# Patient Record
Sex: Male | Born: 1955 | Race: Black or African American | Hispanic: No | Smoking: Former smoker
Health system: Southern US, Community
[De-identification: ages and names within clinical notes are randomized; demographics above are authoritative.]

## PROBLEM LIST (undated history)

## (undated) DIAGNOSIS — G43909 Migraine, unspecified, not intractable, without status migrainosus: Secondary | ICD-10-CM

## (undated) DIAGNOSIS — J45909 Unspecified asthma, uncomplicated: Secondary | ICD-10-CM

## (undated) DIAGNOSIS — C3490 Malignant neoplasm of unspecified part of unspecified bronchus or lung: Secondary | ICD-10-CM

## (undated) DIAGNOSIS — E079 Disorder of thyroid, unspecified: Secondary | ICD-10-CM

## (undated) DIAGNOSIS — M549 Dorsalgia, unspecified: Secondary | ICD-10-CM

## (undated) HISTORY — PX: THYROIDECTOMY: SHX17

## (undated) HISTORY — DX: Migraine, unspecified, not intractable, without status migrainosus: G43.909

## (undated) HISTORY — DX: Malignant neoplasm of unspecified part of unspecified bronchus or lung: C34.90

## (undated) HISTORY — DX: Unspecified asthma, uncomplicated: J45.909

---

## 2002-06-27 ENCOUNTER — Emergency Department (HOSPITAL_COMMUNITY): Admission: EM | Admit: 2002-06-27 | Discharge: 2002-06-27 | Payer: Self-pay | Admitting: *Deleted

## 2008-05-21 ENCOUNTER — Emergency Department (HOSPITAL_COMMUNITY): Admission: EM | Admit: 2008-05-21 | Discharge: 2008-05-22 | Payer: Self-pay | Admitting: Emergency Medicine

## 2012-10-23 ENCOUNTER — Other Ambulatory Visit (HOSPITAL_COMMUNITY): Payer: Self-pay | Admitting: Physician Assistant

## 2012-10-23 DIAGNOSIS — R2241 Localized swelling, mass and lump, right lower limb: Secondary | ICD-10-CM

## 2012-10-25 ENCOUNTER — Ambulatory Visit (HOSPITAL_COMMUNITY): Admission: RE | Admit: 2012-10-25 | Payer: Self-pay | Source: Ambulatory Visit

## 2014-04-16 ENCOUNTER — Emergency Department (HOSPITAL_COMMUNITY)
Admission: EM | Admit: 2014-04-16 | Discharge: 2014-04-16 | Disposition: A | Discharge status: Home / Self Care | Payer: Self-pay | Attending: Emergency Medicine | Admitting: Emergency Medicine

## 2014-04-16 ENCOUNTER — Encounter (HOSPITAL_COMMUNITY): Payer: Self-pay | Admitting: Emergency Medicine

## 2014-04-16 DIAGNOSIS — M545 Low back pain: Secondary | ICD-10-CM

## 2014-04-16 DIAGNOSIS — M5136 Other intervertebral disc degeneration, lumbar region: Secondary | ICD-10-CM

## 2014-04-16 DIAGNOSIS — M5440 Lumbago with sciatica, unspecified side: Secondary | ICD-10-CM | POA: Insufficient documentation

## 2014-04-16 DIAGNOSIS — G8929 Other chronic pain: Secondary | ICD-10-CM | POA: Insufficient documentation

## 2014-04-16 DIAGNOSIS — Z720 Tobacco use: Secondary | ICD-10-CM | POA: Insufficient documentation

## 2014-04-16 DIAGNOSIS — Z8639 Personal history of other endocrine, nutritional and metabolic disease: Secondary | ICD-10-CM | POA: Insufficient documentation

## 2014-04-16 HISTORY — DX: Dorsalgia, unspecified: M54.9

## 2014-04-16 HISTORY — DX: Disorder of thyroid, unspecified: E07.9

## 2014-04-16 MED ORDER — OXYCODONE-ACETAMINOPHEN 5-325 MG PO TABS
2.0000 | ORAL_TABLET | Freq: Once | ORAL | Status: AC
  Administered 2014-04-16: 2 via ORAL
  Filled 2014-04-16: qty 2

## 2014-04-16 MED ORDER — CELECOXIB 100 MG PO CAPS: 100.0000 mg | ORAL_CAPSULE | Freq: Two times a day (BID) | ORAL | Status: DC

## 2014-04-16 MED ORDER — OXYCODONE-ACETAMINOPHEN 10-325 MG PO TABS: 1.0000 | ORAL_TABLET | Freq: Four times a day (QID) | ORAL | Status: DC | PRN

## 2014-04-16 MED ORDER — KETOROLAC TROMETHAMINE 10 MG PO TABS
10.0000 mg | ORAL_TABLET | Freq: Once | ORAL | Status: AC
  Administered 2014-04-16: 10 mg via ORAL
  Filled 2014-04-16: qty 1

## 2014-04-16 NOTE — ED Notes (Signed)
PT stated hx of lower back pain with surgery scheduled on April 2016 and stated he slipped and fell x3 days ago and began having worsening in lower back pain since. PT states he called his spine doctor and was told to come to ED. PT stated he was prescribed oxycodone 10mg  and took one on 04/14/14 with relief but doesn't have any more.

## 2014-04-16 NOTE — Discharge Instructions (Signed)
Heat to your lower back maybe helpful. Please use Celebrex 2 times daily, to try to avoid dependence on narcotic pain medication. Discuss  Degenerative Disk Disease Degenerative disk disease is a condition caused by the changes that occur in the cushions of the backbone (spinal disks) as you grow older. Spinal disks are soft and compressible disks located between the bones of the spine (vertebrae). They act like shock absorbers. Degenerative disk disease can affect the whole spine. However, the neck and lower back are most commonly affected. Many changes can occur in the spinal disks with aging, such as:  The spinal disks may dry and shrink.  Small tears may occur in the tough, outer covering of the disk (annulus).  The disk space may become smaller due to loss of water.  Abnormal growths in the bone (spurs) may occur. This can put pressure on the nerve roots exiting the spinal canal, causing pain.  The spinal canal may become narrowed. CAUSES  Degenerative disk disease is a condition caused by the changes that occur in the spinal disks with aging. The exact cause is not known, but there is a genetic basis for many patients. Degenerative changes can occur due to loss of fluid in the disk. This makes the disk thinner and reduces the space between the backbones. Small cracks can develop in the outer layer of the disk. This can lead to the breakdown of the disk. You are more likely to get degenerative disk disease if you are overweight. Smoking cigarettes and doing heavy work such as weightlifting can also increase your risk of this condition. Degenerative changes can start after a sudden injury. Growth of bone spurs can compress the nerve roots and cause pain.  SYMPTOMS  The symptoms vary from person to person. Some people may have no pain, while others have severe pain. The pain may be so severe that it can limit your activities. The location of the pain depends on the part of your backbone that is  affected. You will have neck or arm pain if a disk in the neck area is affected. You will have pain in your back, buttocks, or legs if a disk in the lower back is affected. The pain becomes worse while bending, reaching up, or with twisting movements. The pain may start gradually and then get worse as time passes. It may also start after a major or minor injury. You may feel numbness or tingling in the arms or legs.  DIAGNOSIS  Your caregiver will ask you about your symptoms and about activities or habits that may cause the pain. He or she may also ask about any injuries, diseases, or treatments you have had earlier. Your caregiver will examine you to check for the range of movement that is possible in the affected area, to check for strength in your extremities, and to check for sensation in the areas of the arms and legs supplied by different nerve roots. An X-ray of the spine may be taken. Your caregiver may suggest other imaging tests, such as magnetic resonance imaging (MRI), if needed.  TREATMENT  Treatment includes rest, modifying your activities, and applying ice and heat. Your caregiver may prescribe medicines to reduce your pain and may ask you to do some exercises to strengthen your back. In some cases, you may need surgery. You and your caregiver will decide on the treatment that is best for you. HOME CARE INSTRUCTIONS   Follow proper lifting and walking techniques as advised by your caregiver.  Maintain good posture.  Exercise regularly as advised.  Perform relaxation exercises.  Change your sitting, standing, and sleeping habits as advised. Change positions frequently.  Lose weight as advised.  Stop smoking if you smoke.  Wear supportive footwear. SEEK MEDICAL CARE IF:  Your pain does not go away within 1 to 4 weeks. SEEK IMMEDIATE MEDICAL CARE IF:   Your pain is severe.  You notice weakness in your arms, hands, or legs.  You begin to lose control of your bladder or bowel  movements. MAKE SURE YOU:   Understand these instructions.  Will watch your condition.  Will get help right away if you are not doing well or get worse. Document Released: 12/13/2006 Document Revised: 05/10/2011 Document Reviewed: 06/19/2013 [facility] Patient Information 2015 [place], [place]. This information is not intended to replace advice given to you by your health care provider. Make sure you discuss any questions you have with your health care provider.  pain maintenance with your specialist.

## 2014-04-16 NOTE — ED Provider Notes (Signed)
CSN: [id number]     Arrival date & time 04/16/14  1844 History   First MD Initiated Contact with Patient 04/16/14 2016     Chief Complaint  Patient presents with  . Back Pain     (Consider location/radiation/quality/duration/timing/severity/associated sxs/prior Treatment) HPI Comments: Pt states he has extensive back problems. He is scheduled to see a specialist for surgery in April. [place] was told to come to the ED for pain until he can return.  Patient is a 59 y.o. male presenting with back pain. The history is provided by the patient.  Back Pain Location:  Lumbar spine Quality:  Aching and shooting Pain severity:  Severe Pain is:  Same all the time Onset quality:  Gradual Duration: acute on chronic pain. Timing:  Intermittent Progression:  Worsening Chronicity:  Chronic Relieved by:  Narcotics Worsened by:  Movement and standing Ineffective treatments:  Lying down Associated symptoms: no abdominal pain, no bladder incontinence, no chest pain, no dysuria and no perianal numbness   Risk factors: no recent surgery and no vascular disease     Past Medical History  Diagnosis Date  . Back pain   . Thyroid disease    Past Surgical History  Procedure Laterality Date  . Thyroidectomy     No family history on file. History  Substance Use Topics  . Smoking status: Current Every Day Smoker -- 0.50 packs/day    Types: Cigarettes  . Smokeless tobacco: Not on file  . Alcohol Use: No    Review of Systems  Constitutional: Negative for activity change.       All ROS Neg except as noted in HPI  HENT: Negative for nosebleeds.   Eyes: Negative for photophobia and discharge.  Respiratory: Negative for cough, shortness of breath and wheezing.   Cardiovascular: Negative for chest pain and palpitations.  Gastrointestinal: Negative for abdominal pain and blood in stool.  Genitourinary: Negative for bladder incontinence, dysuria, frequency and hematuria.    Musculoskeletal: Positive for back pain. Negative for arthralgias and neck pain.  Skin: Negative.   Neurological: Negative for dizziness, seizures and speech difficulty.  Psychiatric/Behavioral: Negative for hallucinations and confusion.      Allergies  Review of patient's allergies indicates no known allergies.  Home Medications   Prior to Admission medications   Not on File   BP 131/93 mmHg  Pulse 81  Temp(Src) 98 F (36.7 C) (Oral)  Resp 16  Ht 5\' 9"  (1.753 m)  Wt 177 lb (80.287 kg)  BMI 26.13 kg/m2  SpO2 95% Physical Exam  Constitutional: He is oriented to person, place, and time. He appears well-developed and well-nourished.  Non-toxic appearance.  HENT:  Head: Normocephalic.  Right Ear: Tympanic membrane and external ear normal.  Left Ear: Tympanic membrane and external ear normal.  Eyes: EOM and lids are normal. Pupils are equal, round, and reactive to light.  Neck: Normal range of motion. Neck supple. Carotid bruit is not present.  Cardiovascular: Normal rate, regular rhythm, normal heart sounds, intact distal pulses and normal pulses.   Pulmonary/Chest: Breath sounds normal. No respiratory distress.  Abdominal: Soft. Bowel sounds are normal. There is no tenderness. There is no guarding.  Musculoskeletal:       Lumbar back: He exhibits decreased range of motion, tenderness and pain.       Back:  Lymphadenopathy:       Head (right side): No submandibular adenopathy present.       Head (left side):  No submandibular adenopathy present.    He has no cervical adenopathy.  Neurological: He is alert and oriented to person, place, and time. He has normal strength. No cranial nerve deficit or sensory deficit.  Skin: Skin is warm and dry.  Psychiatric: He has a normal mood and affect. His speech is normal.  Nursing note and vitals reviewed.   ED Course  Procedures (including critical care time) Labs Review Labs Reviewed - No data to display  Imaging Review No  results found.   EKG Interpretation None      MDM  Vital signs stable. No gross neuro deficit noted. Pt to see his spine MD in April. Pt encourages to see specialist sooner. Rx for celebrex and percocet given.   Final diagnoses:  None    *I have reviewed nursing notes, vital signs, and all appropriate lab and imaging results for this patient.**    Lenox Ahr, PA-C 04/18/14 [employer], PA-C 04/18/14 [place], DO 04/19/14 Ninetta Lights

## 2014-07-17 ENCOUNTER — Encounter: Payer: Self-pay | Admitting: Emergency Medicine

## 2014-07-17 ENCOUNTER — Emergency Department
Admission: EM | Admit: 2014-07-17 | Discharge: 2014-07-18 | Disposition: A | Discharge status: Other Acute Inpatient Hospital | Payer: Self-pay | Attending: Emergency Medicine | Admitting: Emergency Medicine

## 2014-07-17 DIAGNOSIS — F1110 Opioid abuse, uncomplicated: Secondary | ICD-10-CM | POA: Insufficient documentation

## 2014-07-17 DIAGNOSIS — Z720 Tobacco use: Secondary | ICD-10-CM | POA: Insufficient documentation

## 2014-07-17 DIAGNOSIS — F1410 Cocaine abuse, uncomplicated: Secondary | ICD-10-CM | POA: Insufficient documentation

## 2014-07-17 LAB — COMPREHENSIVE METABOLIC PANEL
ALT: 23 U/L (ref 17–63)
AST: 16 U/L (ref 15–41)
Albumin: 3.9 g/dL (ref 3.5–5.0)
Alkaline Phosphatase: 61 U/L (ref 38–126)
Anion gap: 8 (ref 5–15)
BILIRUBIN TOTAL: 0.4 mg/dL (ref 0.3–1.2)
BUN: 14 mg/dL (ref 6–20)
CO2: 30 mmol/L (ref 22–32)
Calcium: 8.7 mg/dL — ABNORMAL LOW (ref 8.9–10.3)
Chloride: 101 mmol/L (ref 101–111)
Creatinine, Ser: 0.99 mg/dL (ref 0.61–1.24)
GFR calc non Af Amer: >60 mL/min (ref >60)
Glucose, Bld: 93 mg/dL (ref 65–99)
POTASSIUM: 3.7 mmol/L (ref 3.5–5.1)
Sodium: 139 mmol/L (ref 135–145)
Total Protein: 8.5 g/dL — ABNORMAL HIGH (ref 6.5–8.1)

## 2014-07-17 LAB — CBC
HCT: 40.4 % (ref 40.0–52.0)
Hemoglobin: 13.6 g/dL (ref 13.0–18.0)
MCH: 33.1 pg (ref 26.0–34.0)
MCHC: 33.8 g/dL (ref 32.0–36.0)
MCV: 97.8 fL (ref 80.0–100.0)
PLATELETS: 305 10*3/uL (ref 150–440)
RBC: 4.13 MIL/uL — AB (ref 4.40–5.90)
RDW: 13.8 % (ref 11.5–14.5)
WBC: 6.2 10*3/uL (ref 3.8–10.6)

## 2014-07-17 LAB — URINE DRUG SCREEN, QUALITATIVE (ARMC ONLY)
AMPHETAMINES, UR SCREEN: NOT DETECTED
BENZODIAZEPINE, UR SCRN: NOT DETECTED
Barbiturates, Ur Screen: NOT DETECTED
COCAINE METABOLITE, UR ~~LOC~~: POSITIVE — AB
Cannabinoid 50 Ng, Ur ~~LOC~~: NOT DETECTED
MDMA (Ecstasy)Ur Screen: NOT DETECTED
Methadone Scn, Ur: NOT DETECTED
Opiate, Ur Screen: NOT DETECTED
Phencyclidine (PCP) Ur S: NOT DETECTED
TRICYCLIC, UR SCREEN: NOT DETECTED

## 2014-07-17 LAB — SALICYLATE LEVEL: Salicylate Lvl: <4 mg/dL (ref 2.8–30.0)

## 2014-07-17 LAB — ETHANOL

## 2014-07-17 LAB — ACETAMINOPHEN LEVEL

## 2014-07-17 NOTE — ED Provider Notes (Signed)
[facility] Emergency Department Provider Note  ____________________________________________  Time seen: 1610  I have reviewed the triage vital signs and the nursing notes.   HISTORY  Chief Complaint Drug / Alcohol Assessment  Abuse of narcotic pain medication and powdered cocaine.   HPI Miguel Colon is a 59 y.o. male who is seeking help for addiction to narcotic pain pills and to cocaine. He reports he began using drugs 5 years ago when he was 89. He was developing chronic pain in his back and was prescribed pain medications. He reports he has a lumbar herniated disc which causes ongoing pain in his back and into his legs. He also uses powder cocaine. The last time he used either of these was yesterday.  While he reports diffuse pain in his back and legs, he denies pain in his chest or head. He has no shortness of breath, he does not appear agitated, and he denies nausea vomiting or diarrhea.    Past Medical History  Diagnosis Date  . Back pain   . Thyroid disease     There are no active problems to display for this patient.   Past Surgical History  Procedure Laterality Date  . Thyroidectomy      Current Outpatient Rx  Name  Route  Sig  Dispense  Refill  . celecoxib (CELEBREX) 100 MG capsule   Oral   Take 1 capsule (100 mg total) by mouth 2 (two) times daily.   14 capsule   0   . oxyCODONE-acetaminophen (PERCOCET) 10-325 MG per tablet   Oral   Take 1 tablet by mouth every 6 (six) hours as needed for pain.   15 tablet   0     Allergies Review of patient's allergies indicates no known allergies.  No family history on file.  Social History History  Substance Use Topics  . Smoking status: Current Every Day Smoker -- 0.50 packs/day    Types: Cigarettes  . Smokeless tobacco: Not on file  . Alcohol Use: No    Review of Systems  Constitutional: Negative for fever. ENT: Negative for sore throat. Cardiovascular: Negative for chest  pain. Respiratory: Negative for shortness of breath. Gastrointestinal: Negative for abdominal pain, vomiting and diarrhea. Genitourinary: Negative for dysuria. Musculoskeletal: Negative for back pain. Skin: Negative for rash. Neurological: Negative for headaches Psychiatric: Substance abuse, see history of present illness.  10-point ROS otherwise negative.  ____________________________________________   PHYSICAL EXAM:  VITAL SIGNS: ED Triage Vitals  Enc Vitals Group     BP 07/17/14 1508 140/94 mmHg     Pulse Rate 07/17/14 1508 70     Resp 07/17/14 1508 18     Temp 07/17/14 1508 98.1 F (36.7 C)     Temp Source 07/17/14 1508 Oral     SpO2 07/17/14 1508 100 %     Weight 07/17/14 1504 172 lb (78.019 kg)     Height 07/17/14 1504 '5\' 10"'$  (1.778 m)     Head Cir --      Peak Flow --      Pain Score 07/17/14 1508 7     Pain Loc --      Pain Edu? --      Excl. in [place]? --     Constitutional: Alert and oriented. Well appearing and in no distress. ENT   Head: Normocephalic and atraumatic.   Nose: No congestion/rhinnorhea.   Mouth/Throat: Mucous membranes are moist. Cardiovascular: Normal rate, regular rhythm. Respiratory: Normal respiratory effort without tachypnea. Breath  sounds are clear and equal bilaterally. No wheezes/rales/rhonchi. Gastrointestinal: Soft and nontender. No distention.  Back: Patient has moderate muscle tension noted through most of his back. It appears to be worse in the right lower back. Distention extends into the gluteus muscles on both sides. Musculoskeletal: Nontender with normal range of motion in all extremities.  No noted edema. Neurologic:  Normal speech and language. No gross focal neurologic deficits are appreciated. He is ambulatory and moves overall fairly well. Skin:  Skin is warm, dry. No rash noted. Psychiatric: Mood and affect are normal. Speech and behavior are normal. The patient speaks of his addiction problems with moderate to good  insight. He has no delusional behavior or speech. ____________________________________________    LABS (pertinent positives/negatives)  Blood tests are overall within normal limits. Urine drug screen is positive for cocaine. It is negative for narcotics.  ____________________________________________  ____________________________________________   PROCEDURES  Procedure(s) performed: None  Critical Care performed: No  ____________________________________________   INITIAL IMPRESSION / ASSESSMENT AND PLAN / ED COURSE  Pleasant alert patient seeking help with substance abuse. We have discussed treatment with residential treatment services. They will come and pick the patient up.  ____________________________________________   FINAL CLINICAL IMPRESSION(S) / ED DIAGNOSES  Final diagnoses:  Narcotic abuse  Cocaine abuse      Ahmed Prima, MD 07/17/14 [phone]

## 2014-07-17 NOTE — BHH Counselor (Signed)
Information faxed to [facility]([phone]) and confirmed it was received.  Currently pending review.

## 2014-07-17 NOTE — ED Notes (Signed)
BEHAVIORAL HEALTH ROUNDING Patient sleeping: No. Patient alert and oriented: yes Behavior appropriate: Yes.  ;  Nutrition and fluids offered: Yes  Toileting and hygiene offered: Yes  Sitter present: yes Law enforcement present: Yes  

## 2014-07-17 NOTE — BHH Counselor (Signed)
Received phone call from [facility] ([phone]), will pick pt. Up at 7pm. Information forwarded to RN (Amy T.).

## 2014-07-17 NOTE — ED Notes (Signed)
BEHAVIORAL HEALTH ROUNDING Patient sleeping: Yes.   Patient alert and oriented: not applicable Behavior appropriate: Yes.    Nutrition and fluids offered: No Toileting and hygiene offered: No Sitter present: q15 minute observations and security camera monitoring Law enforcement present: Yes [employer]

## 2014-07-17 NOTE — ED Notes (Signed)
Pr denies SI or HI, pt states he is looking for medical clarence for [facility] for pain medicine use, pt calm and in no distress, pt given water and crackers

## 2014-07-17 NOTE — ED Notes (Signed)
BEHAVIORAL HEALTH ROUNDING Patient sleeping: No. Patient alert and oriented: yes Behavior appropriate: Yes.  ; If no, describe:  Nutrition and fluids offered: yes Toileting and hygiene offered: Yes  Sitter present: q15 minute observations and security camera monitoring Law enforcement present: Yes  [facility]  

## 2014-07-17 NOTE — ED Notes (Signed)
Pt given food tray.

## 2014-07-17 NOTE — ED Notes (Signed)
Pt awaiting transport to [facility]

## 2014-07-17 NOTE — ED Notes (Signed)
Pt needs medical clearance to go to [facility]. Pt requesting detox.

## 2014-07-17 NOTE — ED Notes (Signed)

## 2014-07-17 NOTE — ED Notes (Signed)
Pt transferred into ED BHU   Patient assigned to appropriate care area. Patient oriented to unit/care area: Informed that, for their safety, care areas are designed for safety and monitored by security cameras at all times; Visiting hours and phone times explained to patient. Patient verbalizes understanding, and verbal contract for safety obtained.

## 2014-07-17 NOTE — ED Notes (Signed)
BEHAVIORAL HEALTH ROUNDING Patient sleeping: Yes.   Patient alert and oriented: yes Behavior appropriate: Yes.   Nutrition and fluids offered: No, pt sleeping Toileting and hygiene offered: No, pt sleeping Sitter present: yes Law enforcement present: Yes

## 2014-07-17 NOTE — BH Assessment (Signed)
Assessment Note  Miguel Colon is an 59 y.o. male Who presents to the ER seeking assistance with detox and SA treatment. His primarily uses Cocaine and OxyContin. He states, he started using after he start having trouble with his "L4 & L5" in his back. He has lack awareness of what his symptoms of withdrawal due to not having gone any length of time with any medications. "This my first time trying to stop." Pt. denies having any seizures or blackouts. He also denies having any involvement with the legal system and past charges. Pt. denies SI/HI and AV/H.   Axis I: Substance Abuse and Substance Induced Mood Disorder Axis III:  Past Medical History  Diagnosis Date  . Back pain   . Thyroid disease    Axis IV: economic problems, occupational problems, other psychosocial or environmental problems, problems related to social environment and problems with primary support group  Past Medical History:  Past Medical History  Diagnosis Date  . Back pain   . Thyroid disease     Past Surgical History  Procedure Laterality Date  . Thyroidectomy      Family History: No family history on file.  Social History:  reports that he has been smoking Cigarettes.  He has been smoking about 0.50 packs per day. He does not have any smokeless tobacco history on file. He reports that he does not drink alcohol or use illicit drugs.  Additional Social History:  Alcohol / Drug Use Pain Medications: OxyContin Prescriptions: None reported Over the Counter: None reported History of alcohol / drug use?: Yes Longest period of sobriety (when/how long): None reported Negative Consequences of Use: Personal relationships, Work / Youth worker Withdrawal Symptoms:  (None Reported) Substance #1 Name of Substance 1: Cocaine 1 - Age of First Use: 61 1 - Amount (size/oz): $300 1 - Frequency: 4x a week 1 - Duration: 4 years 1 - Last Use / Amount: 07/16/2014 Substance #2 Name of Substance 2: OxyContin 2 - Age of First Use:  55 2 - Amount (size/oz): 6 to 8, '10mg'$  pills 2 - Frequency: Daily 2 - Duration: 3 years 2 - Last Use / Amount: 07/17/2014  CIWA: CIWA-Ar BP: (!) 140/94 mmHg Pulse Rate: 70 COWS:    Allergies: No Known Allergies  Home Medications:  (Not in a hospital admission)  OB/GYN Status:  No LMP for male patient.  General Assessment Data Location of Assessment: [facility] ED TTS Assessment: In system Is this a Tele or Face-to-Face Assessment?: Face-to-Face Is this an Initial Assessment or a Re-assessment for this encounter?: Initial Assessment Marital status: Single Maiden name: n/a Is patient pregnant?: No Living Arrangements: Alone Can pt return to current living arrangement?: Yes Admission Status: Voluntary Is patient capable of signing voluntary admission?: Yes Referral Source: Self/Family/Friend Insurance type: None  Medical Screening Exam ([place]) Medical Exam completed: Yes  Crisis Care Plan Living Arrangements: Alone Name of Psychiatrist: n/a Name of Therapist: n/a  Education Status Is patient currently in school?: No Current Grade: n/a Highest grade of school patient has completed: 12 Name of school: n/a Contact person: n/a  Risk to self with the past 6 months Suicidal Ideation: No Has patient been a risk to self within the past 6 months prior to admission? : No Suicidal Intent: No Has patient had any suicidal intent within the past 6 months prior to admission? : No Is patient at risk for suicide?: No Suicidal Plan?: No Has patient had any suicidal plan within the past 6 months prior  to admission? : No Access to Means: No What has been your use of drugs/alcohol within the last 12 months?: Cocaine & OxyContin Previous Attempts/Gestures: No How many times?: 0 Other Self Harm Risks: n/a Triggers for Past Attempts: None known Intentional Self Injurious Behavior: None Family Suicide History: No Recent stressful life event(s): Other (Comment)  (Addiction) Persecutory voices/beliefs?: No Depression: No Depression Symptoms: Feeling angry/irritable, Isolating Substance abuse history and/or treatment for substance abuse?:  (Cocaine & OxyContin) Suicide prevention information given to non-admitted patients: Not applicable  Risk to Others within the past 6 months Homicidal Ideation: No Does patient have any lifetime risk of violence toward others beyond the six months prior to admission? : No Thoughts of Harm to Others: No Current Homicidal Intent: No Current Homicidal Plan: No Access to Homicidal Means: No Identified Victim: n/a History of harm to others?: No Assessment of Violence: None Noted Violent Behavior Description: n/a Does patient have access to weapons?: No Does patient have a court date: No Is patient on probation?: No  Psychosis Hallucinations: None noted Delusions: None noted  Mental Status Report Appearance/Hygiene: Unremarkable, In scrubs, In hospital gown Eye Contact: Good Motor Activity: Freedom of movement, Unremarkable Speech: Logical/coherent, Unremarkable Level of Consciousness: Alert Mood: Ashamed/humiliated, Guilty, Pleasant Affect: Sad, Appropriate to circumstance Anxiety Level: Minimal Thought Processes: Coherent, Relevant Judgement: Unimpaired Orientation: Person, Place, Situation, Time, Appropriate for developmental age Obsessive Compulsive Thoughts/Behaviors: None  Cognitive Functioning Concentration: Normal Memory: Recent Intact, Remote Intact IQ: Average Insight: Poor Impulse Control: Poor Appetite: Good Weight Loss: 0 Weight Gain: 0 Sleep: No Change Total Hours of Sleep: 8 Vegetative Symptoms: None  ADLScreening [facility] Assessment Services) Patient's cognitive ability adequate to safely complete daily activities?: Yes Patient able to express need for assistance with ADLs?: Yes Independently performs ADLs?: Yes (appropriate for developmental age)  Prior Inpatient  Therapy Prior Inpatient Therapy: No Prior Therapy Dates: n/a Prior Therapy Facilty/Provider(s): n/a Reason for Treatment: n/a  Prior Outpatient Therapy Prior Outpatient Therapy: No Prior Therapy Facilty/Provider(s): n/a Reason for Treatment: n/a Does patient have an ACCT team?: No Does patient have Intensive In-House Services?  : No Does patient have [facility] services? : No Does patient have P4CC services?: No  ADL Screening (condition at time of admission) Patient's cognitive ability adequate to safely complete daily activities?: Yes Patient able to express need for assistance with ADLs?: Yes Independently performs ADLs?: Yes (appropriate for developmental age)       Abuse/Neglect Assessment (Assessment to be complete while patient is alone) Physical Abuse: Denies Verbal Abuse: Denies Sexual Abuse: Denies Exploitation of patient/patient's resources: Denies Self-Neglect: Denies Values / Beliefs Cultural Requests During Hospitalization: None Spiritual Requests During Hospitalization: None Consults Spiritual Care Consult Needed: No Social Work Consult Needed: No      Additional Information 1:1 In Past 12 Months?: No CIRT Risk: No Elopement Risk: No Does patient have medical clearance?: Yes  Child/Adolescent Assessment Running Away Risk: Denies (Pt. is an adult)  Disposition:  Disposition Initial Assessment Completed for this Encounter: Yes Disposition of Patient: Referred to Patient referred to: [facility]  On Site Evaluation by:   Reviewed with Physician:    Gunnar Fusi, MS, LCAS, [place], [place], CCSI 07/17/2014 5:48 PM

## 2014-07-17 NOTE — Discharge Instructions (Signed)
GO Directly to [facility] with [facility] staff for ongoing treatment.  Return to the emergency department if you have any urgent concerns.

## 2014-07-17 NOTE — ED Notes (Signed)
Pt observed lying in bed  NAD assessed  Pt to go to [facility] at 1900

## 2014-07-18 NOTE — ED Notes (Signed)
BEHAVIORAL HEALTH ROUNDING Patient sleeping: Yes.   Patient alert and oriented: not applicable Behavior appropriate: Yes.    Nutrition and fluids offered: No Toileting and hygiene offered: No Sitter present: q15 minute observations and security camera monitoring Law enforcement present: Yes [employer]

## 2014-09-17 ENCOUNTER — Ambulatory Visit: Payer: Self-pay

## 2014-09-17 ENCOUNTER — Encounter (INDEPENDENT_AMBULATORY_CARE_PROVIDER_SITE_OTHER): Payer: Self-pay

## 2014-09-24 ENCOUNTER — Ambulatory Visit: Payer: Self-pay

## 2014-09-24 DIAGNOSIS — M5136 Other intervertebral disc degeneration, lumbar region: Secondary | ICD-10-CM | POA: Insufficient documentation

## 2014-09-24 DIAGNOSIS — N19 Unspecified kidney failure: Secondary | ICD-10-CM | POA: Insufficient documentation

## 2014-09-24 DIAGNOSIS — M5126 Other intervertebral disc displacement, lumbar region: Secondary | ICD-10-CM | POA: Insufficient documentation

## 2014-09-24 LAB — BASIC METABOLIC PANEL
BUN: 17 mg/dL (ref 4–21)
CREATININE: 1.1 mg/dL (ref 0.6–1.3)
GLUCOSE: 95 mg/dL
SODIUM: 143 mmol/L (ref 137–147)

## 2014-09-24 LAB — TSH: TSH: 21.26 u[IU]/mL — AB (ref 0.41–5.90)

## 2014-09-24 LAB — CBC AND DIFFERENTIAL
NEUTROS ABS: 2 /uL
WBC: 5 10*3/mL

## 2014-09-24 LAB — HEPATIC FUNCTION PANEL: Bilirubin, Total: 0.2 mg/dL

## 2014-09-24 LAB — LIPID PANEL
Cholesterol: 211 mg/dL — AB (ref 0–200)
HDL: 37 mg/dL (ref 35–70)
LDL CALC: 155 mg/dL
TRIGLYCERIDES: 94 mg/dL (ref 40–160)

## 2014-09-24 LAB — HEMOGLOBIN A1C: Hemoglobin A1C: 6.2

## 2014-10-02 ENCOUNTER — Ambulatory Visit: Payer: Self-pay | Admitting: Ophthalmology

## 2014-10-02 ENCOUNTER — Institutional Professional Consult (permissible substitution): Payer: Self-pay | Admitting: Specialist

## 2014-10-15 ENCOUNTER — Institutional Professional Consult (permissible substitution): Payer: Self-pay

## 2014-10-16 ENCOUNTER — Ambulatory Visit: Payer: Self-pay | Admitting: Ophthalmology

## 2014-10-22 ENCOUNTER — Ambulatory Visit: Payer: Self-pay

## 2014-10-22 DIAGNOSIS — E7800 Pure hypercholesterolemia, unspecified: Secondary | ICD-10-CM | POA: Insufficient documentation

## 2014-10-22 DIAGNOSIS — G8929 Other chronic pain: Secondary | ICD-10-CM | POA: Insufficient documentation

## 2014-10-22 DIAGNOSIS — E039 Hypothyroidism, unspecified: Secondary | ICD-10-CM | POA: Insufficient documentation

## 2014-10-22 DIAGNOSIS — M549 Dorsalgia, unspecified: Secondary | ICD-10-CM

## 2014-11-26 ENCOUNTER — Other Ambulatory Visit: Payer: Self-pay

## 2014-12-16 ENCOUNTER — Emergency Department (HOSPITAL_COMMUNITY)
Admission: EM | Admit: 2014-12-16 | Discharge: 2014-12-17 | Disposition: A | Discharge status: Home / Self Care | Payer: Self-pay | Attending: Emergency Medicine | Admitting: Emergency Medicine

## 2014-12-16 ENCOUNTER — Encounter (HOSPITAL_COMMUNITY): Payer: Self-pay | Admitting: Emergency Medicine

## 2014-12-16 DIAGNOSIS — R61 Generalized hyperhidrosis: Secondary | ICD-10-CM | POA: Insufficient documentation

## 2014-12-16 DIAGNOSIS — R200 Anesthesia of skin: Secondary | ICD-10-CM | POA: Insufficient documentation

## 2014-12-16 DIAGNOSIS — Z791 Long term (current) use of non-steroidal anti-inflammatories (NSAID): Secondary | ICD-10-CM | POA: Insufficient documentation

## 2014-12-16 DIAGNOSIS — Z8639 Personal history of other endocrine, nutritional and metabolic disease: Secondary | ICD-10-CM | POA: Insufficient documentation

## 2014-12-16 DIAGNOSIS — R519 Headache, unspecified: Secondary | ICD-10-CM

## 2014-12-16 DIAGNOSIS — R51 Headache: Secondary | ICD-10-CM | POA: Insufficient documentation

## 2014-12-16 DIAGNOSIS — F1410 Cocaine abuse, uncomplicated: Secondary | ICD-10-CM | POA: Insufficient documentation

## 2014-12-16 DIAGNOSIS — R079 Chest pain, unspecified: Secondary | ICD-10-CM

## 2014-12-16 DIAGNOSIS — R0789 Other chest pain: Secondary | ICD-10-CM | POA: Insufficient documentation

## 2014-12-16 DIAGNOSIS — Z720 Tobacco use: Secondary | ICD-10-CM | POA: Insufficient documentation

## 2014-12-16 DIAGNOSIS — R110 Nausea: Secondary | ICD-10-CM | POA: Insufficient documentation

## 2014-12-16 DIAGNOSIS — R55 Syncope and collapse: Secondary | ICD-10-CM | POA: Insufficient documentation

## 2014-12-16 DIAGNOSIS — H53149 Visual discomfort, unspecified: Secondary | ICD-10-CM | POA: Insufficient documentation

## 2014-12-16 NOTE — ED Notes (Signed)
Pt reports he was at work and had sudden LOC. Does not know what happened but states he had some chest pain and numbness in his hands.

## 2014-12-17 ENCOUNTER — Emergency Department (HOSPITAL_COMMUNITY): Payer: Self-pay

## 2014-12-17 LAB — URINALYSIS, ROUTINE W REFLEX MICROSCOPIC
BILIRUBIN URINE: NEGATIVE
Glucose, UA: NEGATIVE mg/dL
Hgb urine dipstick: NEGATIVE
Ketones, ur: 40 mg/dL — AB
Leukocytes, UA: NEGATIVE
NITRITE: NEGATIVE
PH: 5.5 (ref 5.0–8.0)
Protein, ur: NEGATIVE mg/dL
SPECIFIC GRAVITY, URINE: 1.025 (ref 1.005–1.030)
UROBILINOGEN UA: 1 mg/dL (ref 0.0–1.0)

## 2014-12-17 LAB — CBC WITH DIFFERENTIAL/PLATELET
Basophils Absolute: 0 10*3/uL (ref 0.0–0.1)
Basophils Relative: 0 %
Eosinophils Absolute: 0.1 10*3/uL (ref 0.0–0.7)
Eosinophils Relative: 1 %
HEMATOCRIT: 39.4 % (ref 39.0–52.0)
Hemoglobin: 13.6 g/dL (ref 13.0–17.0)
LYMPHS PCT: 16 %
Lymphs Abs: 1.6 10*3/uL (ref 0.7–4.0)
MCH: 31.8 pg (ref 26.0–34.0)
MCHC: 34.5 g/dL (ref 30.0–36.0)
MCV: 92.1 fL (ref 78.0–100.0)
MONOS PCT: 9 %
Monocytes Absolute: 1 10*3/uL (ref 0.1–1.0)
Neutro Abs: 7.7 10*3/uL (ref 1.7–7.7)
Neutrophils Relative %: 74 %
Platelets: 276 10*3/uL (ref 150–400)
RBC: 4.28 MIL/uL (ref 4.22–5.81)
RDW: 13.9 % (ref 11.5–15.5)
WBC: 10.3 10*3/uL (ref 4.0–10.5)

## 2014-12-17 LAB — RAPID URINE DRUG SCREEN, HOSP PERFORMED
AMPHETAMINES: NOT DETECTED
BARBITURATES: NOT DETECTED
BENZODIAZEPINES: NOT DETECTED
COCAINE: POSITIVE — AB
OPIATES: NOT DETECTED
Tetrahydrocannabinol: NOT DETECTED

## 2014-12-17 LAB — COMPREHENSIVE METABOLIC PANEL
ALBUMIN: 3.6 g/dL (ref 3.5–5.0)
ALT: 11 U/L — ABNORMAL LOW (ref 17–63)
AST: 17 U/L (ref 15–41)
Alkaline Phosphatase: 56 U/L (ref 38–126)
Anion gap: 9 (ref 5–15)
BILIRUBIN TOTAL: 1 mg/dL (ref 0.3–1.2)
BUN: 16 mg/dL (ref 6–20)
CHLORIDE: 102 mmol/L (ref 101–111)
CO2: 28 mmol/L (ref 22–32)
Calcium: 9.1 mg/dL (ref 8.9–10.3)
Creatinine, Ser: 1.02 mg/dL (ref 0.61–1.24)
GFR calc Af Amer: >60 mL/min (ref >60)
GFR calc non Af Amer: >60 mL/min (ref >60)
GLUCOSE: 102 mg/dL — AB (ref 65–99)
POTASSIUM: 3.8 mmol/L (ref 3.5–5.1)
Sodium: 139 mmol/L (ref 135–145)
Total Protein: 8.5 g/dL — ABNORMAL HIGH (ref 6.5–8.1)

## 2014-12-17 LAB — TROPONIN I
Troponin I: <0.03 ng/mL (ref <0.031)
Troponin I: <0.03 ng/mL (ref <0.031)

## 2014-12-17 LAB — ETHANOL: Alcohol, Ethyl (B): <5 mg/dL (ref <5)

## 2014-12-17 MED ORDER — SODIUM CHLORIDE 0.9 % IV BOLUS (SEPSIS)
1000.0000 mL | Freq: Once | INTRAVENOUS | Status: AC
  Administered 2014-12-17: 1000 mL via INTRAVENOUS

## 2014-12-17 MED ORDER — ASPIRIN 81 MG PO CHEW
324.0000 mg | CHEWABLE_TABLET | Freq: Once | ORAL | Status: AC
  Administered 2014-12-17: 324 mg via ORAL
  Filled 2014-12-17: qty 4

## 2014-12-17 NOTE — ED Provider Notes (Signed)
CSN: [id number]     Arrival date & time 12/16/14  2258 History  By signing my name below, I, Helane Gunther, attest that this documentation has been prepared under the direction and in the presence of Rolland Porter, MD at 2316. Electronically Signed: Helane Gunther, ED Scribe. 12/17/2014. 12:37 AM.    Chief Complaint  Patient presents with  . Loss of Consciousness   The history is provided by the patient and the spouse. No language interpreter was used.   HPI Comments: Miguel Colon is a 59 y.o. male smoker at 0.5 ppd who presents to the Emergency Department complaining of LOC that occurred about 4 hours ago. Pt states he went to work at 8 pm tonight when he began feeling hot, sweating, having a dry mouth, and mild, left-sided CP described as "tightness" radiating down his left arm with numbness in the hand about 8:30 pm. He states he woke up on the floor with a diffuse, pounding migraine HA, exacerbated by bright lights. He notes he was told he had 2 syncopal episodes, but does not remember the first one. He does not know whether he hit his head but denies any bumps or soreness on his head. He notes he currently still has a mild throbbing HA, intermittent CP, and nausea (on his way to work, resolved). He reports he has had similar CP several times before in the summer. He states his HA has alleviated some, and that he is no longer photophobic. He notes he felt completely fine earlier today. Per wife, pt has had loss of energy and has been sleeping all day this weekend. He reports a FHx of heart problems and HTN on his mother's side of the family (aunt and uncle, both deceased), as well as DM.  He denies ever having LOC before. He denies drinking alcohol or taking any street drugs. Pt denies vomiting, abdominal pain, and neck pain.  PCP Dr Gwenyth Bouillon  Past Medical History  Diagnosis Date  . Back pain   . Thyroid disease    Past Surgical History  Procedure Laterality Date  . Thyroidectomy     Family  History  Problem Relation Age of Onset  . Hypertension Mother   . Hypertension Father    Social History  Substance Use Topics  . Smoking status: Current Every Day Smoker -- 0.50 packs/day    Types: Cigarettes  . Smokeless tobacco: Never Used  . Alcohol Use: No  employed Denies street drugs Lives with spouse  Review of Systems  Constitutional: Positive for diaphoresis.  Eyes: Positive for photophobia.  Respiratory: Positive for chest tightness.   Cardiovascular: Positive for chest pain.  Gastrointestinal: Positive for nausea. Negative for vomiting and abdominal pain.  Musculoskeletal: Negative for neck pain.  Neurological: Positive for numbness and headaches.  All other systems reviewed and are negative.   Allergies  Review of patient's allergies indicates no known allergies.  Home Medications   Prior to Admission medications   Medication Sig Start Date End Date Taking? Authorizing Provider  celecoxib (CELEBREX) 100 MG capsule Take 1 capsule (100 mg total) by mouth 2 (two) times daily. 04/16/14   Lily Kocher, PA-C  oxyCODONE-acetaminophen (PERCOCET) 10-325 MG per tablet Take 1 tablet by mouth every 6 (six) hours as needed for pain. 04/16/14   Lily Kocher, PA-C   ED Triage Vitals  Enc Vitals Group     BP 12/16/14 2302 134/88 mmHg     Pulse Rate 12/16/14 2302 85     Resp 12/16/14 2302  20     Temp 12/16/14 2302 98.3 F (36.8 C)     Temp Source 12/16/14 2302 Oral     SpO2 12/16/14 2302 98 %     Weight 12/16/14 2302 172 lb (78.019 kg)     Height 12/16/14 2302 '5\' 10"'$  (1.778 m)     Head Cir --      Peak Flow --      Pain Score 12/16/14 2303 8     Pain Loc --      Pain Edu? --      Excl. in [place]? --     Vital signs normal   Physical Exam  Constitutional: He is oriented to person, place, and time.  Non-toxic appearance. He does not appear ill. No distress.  Thin male  HENT:  Head: Normocephalic and atraumatic.  Right Ear: External ear normal.  Left Ear: External  ear normal.  Nose: Nose normal. No mucosal edema or rhinorrhea.  Mouth/Throat: Oropharynx is clear and moist and mucous membranes are normal. No dental abscesses or uvula swelling.  Missing several teeth  Eyes: Conjunctivae and EOM are normal. Pupils are equal, round, and reactive to light.  Neck: Normal range of motion and full passive range of motion without pain. Neck supple.  Cardiovascular: Normal rate, regular rhythm and normal heart sounds.  Exam reveals no gallop and no friction rub.   No murmur heard. Pulmonary/Chest: Effort normal and breath sounds normal. No respiratory distress. He has no wheezes. He has no rhonchi. He has no rales. He exhibits tenderness. He exhibits no crepitus.    TTP on the L lateral muscle masses of the chest wall  Abdominal: Soft. Normal appearance and bowel sounds are normal. He exhibits no distension. There is no tenderness. There is no rebound and no guarding.  Musculoskeletal: Normal range of motion. He exhibits no edema or tenderness.  Moves all extremities well.   Neurological: He is alert and oriented to person, place, and time. He has normal strength. No cranial nerve deficit.  Skin: Skin is warm, dry and intact. No rash noted. No erythema. No pallor.  Psychiatric: He has a normal mood and affect. His speech is normal and behavior is normal. His mood appears not anxious.  Nursing note and vitals reviewed.   ED Course  Procedures  DIAGNOSTIC STUDIES: Oxygen Saturation is 98% on RA, normal by my interpretation.    COORDINATION OF CARE: 12:36 AM - Discussed plans to order IV fluids, diagnostic studies and imaging, and a second EKG. Pt advised of plan for treatment and pt agrees.  Recheck at 2 AM patient states he is chest pain-free. He is not having a headache. We discussed his initial laboratory results. He seems surprised about the positive cocaine. He denies eating any type of poppy seed buns. We discussed getting the second troponin. I am going  to refer him to cardiology for further evaluation of his syncope and atypical chest pain. We also discussed his abnormal findings on his chest x-ray. He is scheduled to have an outpatient CT scan of his chest done on Thursday, October 19 at 5:30 PM.  Labs Review Results for orders placed or performed during the hospital encounter of 12/16/14  Comprehensive metabolic panel  Result Value Ref Range   Sodium 139 135 - 145 mmol/L   Potassium 3.8 3.5 - 5.1 mmol/L   Chloride 102 101 - 111 mmol/L   CO2 28 22 - 32 mmol/L   Glucose, Bld 102 (H) 65 - 99  mg/dL   BUN 16 6 - 20 mg/dL   Creatinine, Ser 1.02 0.61 - 1.24 mg/dL   Calcium 9.1 8.9 - 10.3 mg/dL   Total Protein 8.5 (H) 6.5 - 8.1 g/dL   Albumin 3.6 3.5 - 5.0 g/dL   AST 17 15 - 41 U/L   ALT 11 (L) 17 - 63 U/L   Alkaline Phosphatase 56 38 - 126 U/L   Total Bilirubin 1.0 0.3 - 1.2 mg/dL   GFR calc non Af Amer >60 >60 mL/min   GFR calc Af Amer >60 >60 mL/min   Anion gap 9 5 - 15  Ethanol  Result Value Ref Range   Alcohol, Ethyl (B) <5 <5 mg/dL  CBC with Differential  Result Value Ref Range   WBC 10.3 4.0 - 10.5 K/uL   RBC 4.28 4.22 - 5.81 MIL/uL   Hemoglobin 13.6 13.0 - 17.0 g/dL   HCT 39.4 39.0 - 52.0 %   MCV 92.1 78.0 - 100.0 fL   MCH 31.8 26.0 - 34.0 pg   MCHC 34.5 30.0 - 36.0 g/dL   RDW 13.9 11.5 - 15.5 %   Platelets 276 150 - 400 K/uL   Neutrophils Relative % 74 %   Neutro Abs 7.7 1.7 - 7.7 K/uL   Lymphocytes Relative 16 %   Lymphs Abs 1.6 0.7 - 4.0 K/uL   Monocytes Relative 9 %   Monocytes Absolute 1.0 0.1 - 1.0 K/uL   Eosinophils Relative 1 %   Eosinophils Absolute 0.1 0.0 - 0.7 K/uL   Basophils Relative 0 %   Basophils Absolute 0.0 0.0 - 0.1 K/uL  Urinalysis, Routine w reflex microscopic  Result Value Ref Range   Color, Urine YELLOW YELLOW   APPearance CLEAR CLEAR   Specific Gravity, Urine 1.025 1.005 - 1.030   pH 5.5 5.0 - 8.0   Glucose, UA NEGATIVE NEGATIVE mg/dL   Hgb urine dipstick NEGATIVE NEGATIVE    Bilirubin Urine NEGATIVE NEGATIVE   Ketones, ur 40 (A) NEGATIVE mg/dL   Protein, ur NEGATIVE NEGATIVE mg/dL   Urobilinogen, UA 1.0 0.0 - 1.0 mg/dL   Nitrite NEGATIVE NEGATIVE   Leukocytes, UA NEGATIVE NEGATIVE  Urine rapid drug screen (hosp performed)  Result Value Ref Range   Opiates NONE DETECTED NONE DETECTED   Cocaine POSITIVE (A) NONE DETECTED   Benzodiazepines NONE DETECTED NONE DETECTED   Amphetamines NONE DETECTED NONE DETECTED   Tetrahydrocannabinol NONE DETECTED NONE DETECTED   Barbiturates NONE DETECTED NONE DETECTED  Troponin I  Result Value Ref Range   Troponin I <0.03 <0.031 ng/mL  Troponin I  Result Value Ref Range   Troponin I <0.03 <0.031 ng/mL   Laboratory interpretation all normal except positive UDS despite denying doing street drugs, negative delta troponins     Imaging Review  I have personally reviewed and evaluated these images and lab results as part of my medical decision-making.   EKG Interpretation ED ECG REPORT #1  Date: 12/17/2014  Rate: 82  Rhythm: normal sinus rhythm  QRS Axis: normal  Intervals: normal  ST/T Wave abnormalities: baseline wander ? ER  Conduction Disutrbances:none  Narrative Interpretation:   Old EKG Reviewed: none available  I have personally reviewed the EKG tracing and agree with the computerized printout as noted.   Date/Time:  Tuesday December 17 2014 01:02:23 EDT Ventricular Rate:  75 PR Interval:  145 QRS Duration: 89 QT Interval:  379 QTC Calculation: 423 R Axis:   82 Text Interpretation:  Sinus rhythm Probable left  atrial enlargement Early  repolarization pattern No significant change since last tracing earlier  today Confirmed by Athira Janowicz  MD-I, Azarian Starace ([id number]) on 12/17/2014 1:29:47 AM       MDM   Final diagnoses:  Syncope, unspecified syncope type  Chest pain, unspecified chest pain type  Acute nonintractable headache, unspecified headache type    Plan discharge  Rolland Porter, MD, FACEP   I  personally performed the services described in this documentation, which was scribed in my presence. The recorded information has been reviewed and considered.  Vital signs normal    Rolland Porter, MD 12/17/14 [phone]

## 2014-12-17 NOTE — Discharge Instructions (Signed)
You are scheduled for an outpatient CT scan of your chest on Thursday, October 19th at 5:30 pm. Arrive at least 15 minutes early to register. Call the cardiology office to get an appointment to be evaluated further for your chest pain and passing out episodes tonight.  Return to the ED if you feel worse.    Syncope Syncope means a person passes out (faints). The person usually wakes up in less than 5 minutes. It is important to seek medical care for syncope. HOME CARE  Have someone stay with you until you feel normal.  Do not drive, use machines, or play sports until your doctor says it is okay.  Keep all doctor visits as told.  Lie down when you feel like you might pass out. Take deep breaths. Wait until you feel normal before standing up.  Drink enough fluids to keep your pee (urine) clear or pale yellow.  If you take blood pressure or heart medicine, get up slowly. Take several minutes to sit and then stand. GET HELP RIGHT AWAY IF:   You have a severe headache.  You have pain in the chest, belly (abdomen), or back.  You are bleeding from the mouth or butt (rectum).  You have black or tarry poop (stool).  You have an irregular or very fast heartbeat.  You have pain with breathing.  You keep passing out, or you have shaking (seizures) when you pass out.  You pass out when sitting or lying down.  You feel confused.  You have trouble walking.  You have severe weakness.  You have vision problems. If you fainted, call for help (911 in [place] Do not drive yourself to the hospital.   This information is not intended to replace advice given to you by your health care provider. Make sure you discuss any questions you have with your health care provider.   Document Released: 08/04/2007 Document Revised: 07/02/2014 Document Reviewed: 04/16/2011 Elsevier Interactive Patient Education [employer].

## 2014-12-17 NOTE — ED Notes (Signed)
Pt states understanding of care given and follow up instructions 

## 2014-12-18 ENCOUNTER — Ambulatory Visit (HOSPITAL_COMMUNITY)
Admit: 2014-12-18 | Discharge: 2014-12-18 | Disposition: A | Discharge status: Home / Self Care | Payer: Self-pay | Source: Ambulatory Visit | Attending: Emergency Medicine | Admitting: Emergency Medicine

## 2014-12-18 DIAGNOSIS — I898 Other specified noninfective disorders of lymphatic vessels and lymph nodes: Secondary | ICD-10-CM | POA: Insufficient documentation

## 2014-12-18 DIAGNOSIS — R079 Chest pain, unspecified: Secondary | ICD-10-CM | POA: Insufficient documentation

## 2014-12-18 DIAGNOSIS — R918 Other nonspecific abnormal finding of lung field: Secondary | ICD-10-CM | POA: Insufficient documentation

## 2014-12-18 MED ORDER — IOHEXOL 300 MG/ML  SOLN
75.0000 mL | Freq: Once | INTRAMUSCULAR | Status: AC | PRN
  Administered 2014-12-18: 75 mL via INTRAVENOUS

## 2015-01-21 ENCOUNTER — Other Ambulatory Visit: Payer: Self-pay

## 2015-07-01 DIAGNOSIS — M549 Dorsalgia, unspecified: Secondary | ICD-10-CM

## 2015-07-01 DIAGNOSIS — N19 Unspecified kidney failure: Secondary | ICD-10-CM

## 2015-07-01 DIAGNOSIS — M5126 Other intervertebral disc displacement, lumbar region: Secondary | ICD-10-CM

## 2015-07-01 DIAGNOSIS — M5136 Other intervertebral disc degeneration, lumbar region: Secondary | ICD-10-CM

## 2015-07-01 DIAGNOSIS — G8929 Other chronic pain: Secondary | ICD-10-CM

## 2015-07-01 DIAGNOSIS — E039 Hypothyroidism, unspecified: Secondary | ICD-10-CM

## 2015-07-01 DIAGNOSIS — E7800 Pure hypercholesterolemia, unspecified: Secondary | ICD-10-CM

## 2016-04-27 ENCOUNTER — Other Ambulatory Visit (HOSPITAL_COMMUNITY): Payer: Self-pay | Admitting: Nurse Practitioner

## 2016-04-27 DIAGNOSIS — Z122 Encounter for screening for malignant neoplasm of respiratory organs: Secondary | ICD-10-CM

## 2016-05-03 ENCOUNTER — Ambulatory Visit (HOSPITAL_COMMUNITY): Payer: Medicare Other

## 2018-11-09 ENCOUNTER — Other Ambulatory Visit: Payer: Self-pay

## 2018-11-09 ENCOUNTER — Encounter (HOSPITAL_COMMUNITY): Payer: Self-pay | Admitting: Emergency Medicine

## 2018-11-09 ENCOUNTER — Emergency Department (HOSPITAL_COMMUNITY)
Admission: EM | Admit: 2018-11-09 | Discharge: 2018-11-09 | Disposition: A | Discharge status: Home / Self Care | Payer: Medicare Other | Attending: Emergency Medicine | Admitting: Emergency Medicine

## 2018-11-09 ENCOUNTER — Emergency Department (HOSPITAL_COMMUNITY): Payer: Medicare Other

## 2018-11-09 DIAGNOSIS — R1032 Left lower quadrant pain: Secondary | ICD-10-CM | POA: Insufficient documentation

## 2018-11-09 DIAGNOSIS — F17210 Nicotine dependence, cigarettes, uncomplicated: Secondary | ICD-10-CM | POA: Diagnosis not present

## 2018-11-09 DIAGNOSIS — E039 Hypothyroidism, unspecified: Secondary | ICD-10-CM | POA: Diagnosis not present

## 2018-11-09 DIAGNOSIS — J45909 Unspecified asthma, uncomplicated: Secondary | ICD-10-CM | POA: Insufficient documentation

## 2018-11-09 LAB — COMPREHENSIVE METABOLIC PANEL WITH GFR
ALT: 13 U/L (ref 0–44)
AST: 12 U/L — ABNORMAL LOW (ref 15–41)
Albumin: 3.7 g/dL (ref 3.5–5.0)
Alkaline Phosphatase: 57 U/L (ref 38–126)
Anion gap: 10 (ref 5–15)
BUN: 19 mg/dL (ref 8–23)
CO2: 26 mmol/L (ref 22–32)
Calcium: 9 mg/dL (ref 8.9–10.3)
Chloride: 103 mmol/L (ref 98–111)
Creatinine, Ser: 1.14 mg/dL (ref 0.61–1.24)
GFR calc Af Amer: >60 mL/min
GFR calc non Af Amer: >60 mL/min
Glucose, Bld: 84 mg/dL (ref 70–99)
Potassium: 4.1 mmol/L (ref 3.5–5.1)
Sodium: 139 mmol/L (ref 135–145)
Total Bilirubin: 0.2 mg/dL — ABNORMAL LOW (ref 0.3–1.2)
Total Protein: 7.6 g/dL (ref 6.5–8.1)

## 2018-11-09 LAB — CBC WITH DIFFERENTIAL/PLATELET
Abs Immature Granulocytes: 0.01 10*3/uL (ref 0.00–0.07)
Basophils Absolute: 0 10*3/uL (ref 0.0–0.1)
Basophils Relative: 0 %
Eosinophils Absolute: 0.1 10*3/uL (ref 0.0–0.5)
Eosinophils Relative: 3 %
HCT: 46.5 % (ref 39.0–52.0)
Hemoglobin: 14.6 g/dL (ref 13.0–17.0)
Immature Granulocytes: 0 %
Lymphocytes Relative: 54 %
Lymphs Abs: 2.9 10*3/uL (ref 0.7–4.0)
MCH: 28.7 pg (ref 26.0–34.0)
MCHC: 31.4 g/dL (ref 30.0–36.0)
MCV: 91.5 fL (ref 80.0–100.0)
Monocytes Absolute: 0.5 10*3/uL (ref 0.1–1.0)
Monocytes Relative: 8 %
Neutro Abs: 1.9 10*3/uL (ref 1.7–7.7)
Neutrophils Relative %: 35 %
Platelets: 328 10*3/uL (ref 150–400)
RBC: 5.08 MIL/uL (ref 4.22–5.81)
RDW: 16.8 % — ABNORMAL HIGH (ref 11.5–15.5)
WBC: 5.5 10*3/uL (ref 4.0–10.5)
nRBC: 0 % (ref 0.0–0.2)

## 2018-11-09 LAB — POC OCCULT BLOOD, ED: Fecal Occult Bld: NEGATIVE

## 2018-11-09 LAB — URINALYSIS, ROUTINE W REFLEX MICROSCOPIC
Bilirubin Urine: NEGATIVE
Glucose, UA: NEGATIVE mg/dL
Hgb urine dipstick: NEGATIVE
Ketones, ur: NEGATIVE mg/dL
Leukocytes,Ua: NEGATIVE
Nitrite: NEGATIVE
Protein, ur: NEGATIVE mg/dL
Specific Gravity, Urine: 1.026 (ref 1.005–1.030)
pH: 5 (ref 5.0–8.0)

## 2018-11-09 LAB — LIPASE, BLOOD: Lipase: 34 U/L (ref 11–51)

## 2018-11-09 MED ORDER — IOHEXOL 300 MG/ML  SOLN
100.0000 mL | Freq: Once | INTRAMUSCULAR | Status: AC | PRN
  Administered 2018-11-09: 22:00:00 100 mL via INTRAVENOUS

## 2018-11-09 MED ORDER — DICYCLOMINE HCL 20 MG PO TABS: 20.0000 mg | ORAL_TABLET | Freq: Two times a day (BID) | ORAL | 0 refills | Status: DC

## 2018-11-09 MED ORDER — SODIUM CHLORIDE 0.9% FLUSH: 3.0000 mL | Freq: Once | INTRAVENOUS | Status: DC

## 2018-11-09 NOTE — Discharge Instructions (Signed)
Lab work and CT here in the ED were reassuring. Your work-up should continue through your primary care provider or gastroenterology. Dicyclomine: Dicyclomine (generic for Bentyl) is what is known as an antispasmodic and is intended to help reduce abdominal discomfort.   Return: Return to the ED for significantly increased pain, uncontrolled vomiting, passing out, chest pain, or any other major concerns.

## 2018-11-09 NOTE — ED Triage Notes (Signed)
Patient reports left sided abdominal pain x 2 weeks. Patient states the pain is worse in the morning. Denies v/d, no nausea, no reduction in appetite. Patient states he has only had BMs once a week for several years. Reports blood when he cleans himself after a BM.

## 2018-11-09 NOTE — ED Provider Notes (Signed)
[facility] EMERGENCY DEPARTMENT Provider Note   CSN: [id number] Arrival date & time: 11/09/18  1823     History   Chief Complaint Chief Complaint  Patient presents with  . Abdominal Pain    HPI Miguel Colon is a 63 y.o. male.     HPI   Miguel Colon is a 63 y.o. male, with a history of asthma, presenting to the ED with abdominal pain for the last 2 weeks, worse over the last 2 to 3 days.  Pain is left lower quadrant, waxing and waning throughout the day, currently 5/10, described as a dull ache.  He states in the morning the pain is severe and sharper, but improves throughout the day.  Pain does not seem to change with eating or with bowel movements. He has noticed a couple instances of bright red blood on the paper after having a bowel movement.  No bloody stool.  He typically has 1 bowel movement a week, which is normal for him.  Last bowel movement was about 4 days ago. Denies fever/chills, N/V/D, hematochezia/melena, dysuria, hematuria, flank/back pain, chest pain, neurologic deficits, syncope, or any other complaints.   Past Medical History:  Diagnosis Date  . Asthma   . Back pain   . Migraines   . Thyroid disease     Patient Active Problem List   Diagnosis Date Noted  . Chronic back pain 10/22/2014  . Hypercholesteremia 10/22/2014  . Hypothyroidism 10/22/2014  . Bulging lumbar disc 09/24/2014  . Renal failure 09/24/2014    Past Surgical History:  Procedure Laterality Date  . THYROIDECTOMY          Home Medications    Prior to Admission medications   Medication Sig Start Date End Date Taking? Authorizing Provider  dicyclomine (BENTYL) 20 MG tablet Take 1 tablet (20 mg total) by mouth 2 (two) times daily. 11/09/18   Daly Whipkey, Helane Gunther, PA-C    Family History Family History  Problem Relation Age of Onset  . Hypertension Mother   . Hypertension Father     Social History Social History   Tobacco Use  . Smoking status: Current Every Day Smoker   Packs/day: 0.50    Types: Cigarettes  . Smokeless tobacco: Never Used  Substance Use Topics  . Alcohol use: No  . Drug use: No     Allergies   Patient has no known allergies.   Review of Systems Review of Systems  Constitutional: Negative for chills, diaphoresis and fever.  Respiratory: Negative for shortness of breath.   Cardiovascular: Negative for chest pain.  Gastrointestinal: Positive for abdominal pain. Negative for blood in stool, diarrhea, nausea and vomiting.  Genitourinary: Negative for difficulty urinating, dysuria, flank pain, frequency, hematuria, scrotal swelling and testicular pain.  Musculoskeletal: Negative for back pain.  Neurological: Negative for syncope, weakness and numbness.  All other systems reviewed and are negative.    Physical Exam Updated Vital Signs BP (!) 103/91   Pulse 86   Temp 98.2 F (36.8 C) (Oral)   Resp 16   SpO2 98%   Physical Exam Vitals signs and nursing note reviewed.  Constitutional:      General: He is not in acute distress.    Appearance: He is well-developed. He is not diaphoretic.  HENT:     Head: Normocephalic and atraumatic.     Mouth/Throat:     Mouth: Mucous membranes are moist.     Pharynx: Oropharynx is clear.  Eyes:     Conjunctiva/sclera: Conjunctivae normal.  Neck:     Musculoskeletal: Neck supple.  Cardiovascular:     Rate and Rhythm: Normal rate and regular rhythm.     Pulses: Normal pulses.          Radial pulses are 2+ on the right side and 2+ on the left side.       Posterior tibial pulses are 2+ on the right side and 2+ on the left side.     Heart sounds: Normal heart sounds.     Comments: Tactile temperature in the extremities appropriate and equal bilaterally. Pulmonary:     Effort: Pulmonary effort is normal. No respiratory distress.     Breath sounds: Normal breath sounds.  Abdominal:     Palpations: Abdomen is soft.     Tenderness: There is abdominal tenderness in the left lower quadrant.  There is no right CVA tenderness, left CVA tenderness or guarding.  Musculoskeletal:     Right lower leg: No edema.     Left lower leg: No edema.  Lymphadenopathy:     Cervical: No cervical adenopathy.  Skin:    General: Skin is warm and dry.  Neurological:     Mental Status: He is alert.     Comments: Sensation grossly intact to light touch in the lower extremities bilaterally.  Strength 5/5 in the bilateral lower extremities. No noted gait deficit.  Psychiatric:        Mood and Affect: Mood and affect normal.        Speech: Speech normal.        Behavior: Behavior normal.      ED Treatments / Results  Labs (all labs ordered are listed, but only abnormal results are displayed) Labs Reviewed  COMPREHENSIVE METABOLIC PANEL - Abnormal; Notable for the following components:      Result Value   AST 12 (*)    Total Bilirubin 0.2 (*)    All other components within normal limits  CBC WITH DIFFERENTIAL/PLATELET - Abnormal; Notable for the following components:   RDW 16.8 (*)    All other components within normal limits  LIPASE, BLOOD  URINALYSIS, ROUTINE W REFLEX MICROSCOPIC  POC OCCULT BLOOD, ED    EKG None  Radiology Ct Abdomen Pelvis W Contrast  Result Date: 11/09/2018 CLINICAL DATA:  Left side abdominal pain EXAM: CT ABDOMEN AND PELVIS WITH CONTRAST TECHNIQUE: Multidetector CT imaging of the abdomen and pelvis was performed using the standard protocol following bolus administration of intravenous contrast. CONTRAST:  113mL OMNIPAQUE IOHEXOL 300 MG/ML  SOLN COMPARISON:  None. FINDINGS: Lower chest: Lung bases are clear. No effusions. Heart is normal size. Hepatobiliary: 10 mm low-density lesion centrally in the right hepatic lobe, difficult to characterize due to its small size. Gallbladder is contracted, unremarkable. Pancreas: No focal abnormality or ductal dilatation. Spleen: No focal abnormality.  Normal size. Adrenals/Urinary Tract: No adrenal abnormality. No focal renal  abnormality. No stones or hydronephrosis. Urinary bladder is unremarkable. Stomach/Bowel: Normal appendix. Stomach, large and small bowel grossly unremarkable. Vascular/Lymphatic: Aortic atherosclerosis. No enlarged abdominal or pelvic lymph nodes. Reproductive: No visible focal abnormality. Other: No free fluid or free air. Musculoskeletal: No acute bony abnormality. IMPRESSION: No acute findings in the abdomen or pelvis. Aortic atherosclerosis. Electronically Signed   By: Rolm Baptise M.D.   On: 11/09/2018 22:11    Procedures Procedures (including critical care time)  Medications Ordered in ED Medications  sodium chloride flush (NS) 0.9 % injection 3 mL (has no administration in time range)  iohexol (OMNIPAQUE) 300  MG/ML solution 100 mL (100 mLs Intravenous Contrast Given 11/09/18 2154)     Initial Impression / Assessment and Plan / ED Course  I have reviewed the triage vital signs and the nursing notes.  Pertinent labs & imaging results that were available during my care of the patient were reviewed by me and considered in my medical decision making (see chart for details).        Patient presents with recurrent abdominal pain over the last 2 weeks. Patient is nontoxic appearing, afebrile, not tachycardic, not tachypneic, not hypotensive, maintains excellent SPO2 on room air, and is in no apparent distress.  Declined analgesia throughout visit.  No leukocytosis. Other lab results are also reassuring. CT without acute abnormality. PCP versus GI follow-up. The patient was given instructions for home care as well as return precautions. Patient voices understanding of these instructions, accepts the plan, and is comfortable with discharge.   Vitals:   11/09/18 1832 11/09/18 1836 11/09/18 2030 11/09/18 2100  BP:  (!) 103/91 (!) 117/94 (!) 125/95  Pulse: 86  (!) 59 60  Resp: 16     Temp: 98.2 F (36.8 C)     TempSrc: Oral     SpO2: 98%  100% 98%     Final Clinical Impressions(s)  / ED Diagnoses   Final diagnoses:  Left lower quadrant abdominal pain    ED Discharge Orders         Ordered    dicyclomine (BENTYL) 20 MG tablet  2 times daily     11/09/18 2220           Derrico, Zhong 11/09/18 2235    Milton Ferguson, MD 11/10/18 1108

## 2018-11-13 ENCOUNTER — Telehealth: Payer: Self-pay

## 2018-11-13 ENCOUNTER — Encounter: Payer: Self-pay | Admitting: Gastroenterology

## 2018-11-13 NOTE — Telephone Encounter (Signed)
Patient seen in er and needs appointment, new patient

## 2018-11-13 NOTE — Telephone Encounter (Signed)
Please schedule ov.  

## 2018-12-08 ENCOUNTER — Ambulatory Visit: Payer: Medicare Other | Admitting: Gastroenterology

## 2018-12-27 ENCOUNTER — Encounter: Payer: Self-pay | Admitting: Gastroenterology

## 2018-12-27 ENCOUNTER — Ambulatory Visit (INDEPENDENT_AMBULATORY_CARE_PROVIDER_SITE_OTHER): Payer: Medicare Other | Admitting: Gastroenterology

## 2018-12-27 ENCOUNTER — Other Ambulatory Visit: Payer: Self-pay

## 2018-12-27 VITALS — BP 127/82 | HR 74 | Temp 98.2°F | Ht 69.0 in | Wt 157.2 lb

## 2018-12-27 DIAGNOSIS — K625 Hemorrhage of anus and rectum: Secondary | ICD-10-CM

## 2018-12-27 DIAGNOSIS — K5900 Constipation, unspecified: Secondary | ICD-10-CM | POA: Diagnosis not present

## 2018-12-27 DIAGNOSIS — R634 Abnormal weight loss: Secondary | ICD-10-CM

## 2018-12-27 DIAGNOSIS — R918 Other nonspecific abnormal finding of lung field: Secondary | ICD-10-CM | POA: Insufficient documentation

## 2018-12-27 DIAGNOSIS — K5909 Other constipation: Secondary | ICD-10-CM | POA: Insufficient documentation

## 2018-12-27 MED ORDER — PEG 3350-KCL-NA BICARB-NACL 420 G PO SOLR: 4000.0000 mL | ORAL | 0 refills | Status: DC

## 2018-12-27 NOTE — Progress Notes (Signed)
Primary Care Physician:  [name], MD Primary Gastroenterologist:  Dr. Gala Romney   Chief Complaint  Patient presents with  . Blood In Stools    none in 1 month. Last TCS done in 2011 in [place]  . Abdominal Pain    "slightly on left side", comes/goes    HPI:   Miguel Colon is a 63 y.o. male presenting today at the request of the ED at [facility] due to abdominal pain and rectal bleeding. CT on file from Sept 2020 in ED without acute findings. Hgb 14.6.   Notes some lumps in adbomen. Started in LUQ. LLQ pain since July/August. Intermittent. Will feel like a knot, then later on go away. BM twice a week. Long-standing. Doesn't notice a change in pain after BM. No precipitating factors. Will try to relax and ignore discomfort. Rectal bleeding end of July. Change his diet. Stopped eating processed foods, bread, chips/pretzels. Baking things, not frying. Not straining. Not straining.   Last colonoscopy around 2011 in [place]. No polyps. Father with colon cancer diagnosed in his 44s. In remission now.   Good appetite. Weight loss of 9 lbs since Sept 2020. Baseline 170s. Now 157.   CT chest with contrast in Oct 2016: multiple pulmonary nodules bilaterally as well as multiple small calcified lymph nodes, emphysematous changes, hypodensity within the liver of uncertain etiology. This was not followed up on, and patient was not aware.   Past Medical History:  Diagnosis Date  . Asthma   . Back pain   . Migraines   . Thyroid disease     Past Surgical History:  Procedure Laterality Date  . THYROIDECTOMY      Current Outpatient Medications  Medication Sig Dispense Refill  . atorvastatin (LIPITOR) 20 MG tablet Take 20 mg by mouth daily at 6 PM.     . levothyroxine (SYNTHROID) 200 MCG tablet Take 200 mcg by mouth daily before breakfast.    . polyethylene glycol-electrolytes (TRILYTE) 420 g solution Take 4,000 mLs by mouth as directed. 4000 mL 0   No current facility-administered  medications for this visit.     Allergies as of 12/27/2018  . (No Known Allergies)    Family History  Problem Relation Age of Onset  . Hypertension Mother   . Hypertension Father   . Colon cancer Father 25    Social History   Socioeconomic History  . Marital status: Legally Separated    Spouse name: Not on file  . Number of children: Not on file  . Years of education: Not on file  . Highest education level: Not on file  Occupational History  . Not on file  Social Needs  . Financial resource strain: Not on file  . Food insecurity    Worry: Not on file    Inability: Not on file  . Transportation needs    Medical: Not on file    Non-medical: Not on file  Tobacco Use  . Smoking status: Current Every Day Smoker    Packs/day: 0.50    Types: Cigarettes  . Smokeless tobacco: Never Used  . Tobacco comment: down to 6 cigarettes a day   Substance and Sexual Activity  . Alcohol use: No  . Drug use: No  . Sexual activity: Not on file  Lifestyle  . Physical activity    Days per week: Not on file    Minutes per session: Not on file  . Stress: Not on file  Relationships  . Social connections  Talks on phone: Not on file    Gets together: Not on file    Attends religious service: Not on file    Active member of club or organization: Not on file    Attends meetings of clubs or organizations: Not on file    Relationship status: Not on file  . Intimate partner violence    Fear of current or ex partner: Not on file    Emotionally abused: Not on file    Physically abused: Not on file    Forced sexual activity: Not on file  Other Topics Concern  . Not on file  Social History Narrative  . Not on file    Review of Systems: Gen: see HPI CV: Denies chest pain, heart palpitations, peripheral edema, syncope.  Resp: Denies shortness of breath at rest or with exertion. Denies wheezing or cough.  GI:see HPI GU : Denies urinary burning, urinary frequency, urinary hesitancy  MS: Denies joint pain, muscle weakness, cramps, or limitation of movement.  Derm: Denies rash, itching, dry skin Psych: Denies depression, anxiety, memory loss, and confusion Heme: see HPI  Physical Exam: BP 127/82   Pulse 74   Temp 98.2 F (36.8 C) (Oral)   Ht 5\' 9"  (1.753 m)   Wt 157 lb 3.2 oz (71.3 kg)   BMI 23.21 kg/m  General:   Alert and oriented. Pleasant and cooperative. Well-nourished and well-developed.  Head:  Normocephalic and atraumatic. Eyes:  Without icterus, sclera clear and conjunctiva pink.  Ears:  Normal auditory acuity. Lungs:  Wheezing bilaterally Heart:  S1, S2 present without murmurs appreciated.  Abdomen:  +BS, soft, non-tender and non-distended. No HSM noted. No guarding or rebound. No masses appreciated. Possible small superficial lipoma LLQ. No obvious mass.  Rectal:  Deferred  Msk:  Symmetrical without gross deformities. Normal posture. Extremities:  Without edema. Neurologic:  Alert and  oriented x4 Psych:  Alert and cooperative. Normal mood and affect.  ASSESSMENT: 63 year old male presenting with 3-4 month history of intermittent LLQ Pain, history of rectal bleeding, in setting of chronic constipation. Family history notable for colorectal cancer in father in his 95s. Last colonoscopy in 2011 in [place]. CT on file from ED without acute findings, and Hgb was normal.   Needs diagnostic colonoscopy in near future in setting of rectal bleeding, known family history of colorectal cancer.   Constipation: start Linzess 290 mcg once daily. Samples provided.  Lung nodules on CT chest in past: updated CT chest after visit today, and this showed no significant change in pulmonary nodules. Further management per PCP.  Weight loss: approximately 9 lbs since Sept 2020, unintentional. Colonoscopy as planned.  Liver lesion: 10 mm low-density lesion difficult to characterize. This was incompletely assessed at time of CT chest in 2016. Needs further follow-up in  future.    PLAN:  1. Proceed with TCS with Dr. Gala Romney in near future: the risks, benefits, and alternatives have been discussed with the patient in detail. The patient states understanding and desires to proceed.  2. Linzess 290 mcg once daily  3. RUQ US abdomen in future.  4. Follow-up thereafter   Annitta Needs, PhD, ANP-BC [facility]

## 2018-12-27 NOTE — Patient Instructions (Signed)
For constipation: start taking Linzess 1 capsule each morning, 30 minutes before breakfast. It may cause loose stool at first but should get better. If not, call me. We can decrease dosage if needed.  We are arranging a CT chest to follow-up on lung nodules.   We will pursue colonoscopy in near future.  Further recommendations to follow!  It was a pleasure to see you today. I want to create trusting relationships with patients to provide genuine, compassionate, and quality care. I value your feedback. If you receive a survey regarding your visit,  I greatly appreciate you taking time to fill this out.   Annitta Needs, PhD, ANP-BC [facility]

## 2018-12-28 ENCOUNTER — Other Ambulatory Visit (HOSPITAL_COMMUNITY)
Admission: RE | Admit: 2018-12-28 | Discharge: 2018-12-28 | Disposition: A | Discharge status: Home / Self Care | Payer: Medicare Other | Source: Ambulatory Visit | Attending: Gastroenterology | Admitting: Gastroenterology

## 2018-12-28 ENCOUNTER — Ambulatory Visit (HOSPITAL_COMMUNITY)
Admission: RE | Admit: 2018-12-28 | Discharge: 2018-12-28 | Disposition: A | Discharge status: Home / Self Care | Payer: Medicare Other | Source: Ambulatory Visit | Attending: Gastroenterology | Admitting: Gastroenterology

## 2018-12-28 DIAGNOSIS — R918 Other nonspecific abnormal finding of lung field: Secondary | ICD-10-CM | POA: Insufficient documentation

## 2018-12-28 LAB — CREATININE, SERUM
Creatinine, Ser: 0.93 mg/dL (ref 0.61–1.24)
GFR calc Af Amer: >60 mL/min (ref >60)
GFR calc non Af Amer: >60 mL/min (ref >60)

## 2018-12-28 MED ORDER — IOHEXOL 300 MG/ML  SOLN
75.0000 mL | Freq: Once | INTRAMUSCULAR | Status: AC | PRN
  Administered 2018-12-28: 11:00:00 75 mL via INTRAVENOUS

## 2019-01-01 ENCOUNTER — Other Ambulatory Visit (HOSPITAL_COMMUNITY)
Admission: RE | Admit: 2019-01-01 | Discharge: 2019-01-01 | Disposition: A | Discharge status: Home / Self Care | Payer: Medicare Other | Source: Ambulatory Visit | Attending: Internal Medicine | Admitting: Internal Medicine

## 2019-01-01 ENCOUNTER — Encounter: Payer: Self-pay | Admitting: Gastroenterology

## 2019-01-01 ENCOUNTER — Other Ambulatory Visit: Payer: Self-pay

## 2019-01-01 ENCOUNTER — Telehealth: Payer: Self-pay | Admitting: *Deleted

## 2019-01-01 DIAGNOSIS — K769 Liver disease, unspecified: Secondary | ICD-10-CM

## 2019-01-01 NOTE — Telephone Encounter (Signed)
Per endo patient did not go for covid-19 testing this morning. Patient procedure scheduled for tomorrow.  Called patient and he thought he could just have testing done tomorrow prior to procedure. Advised patient will need to r/s procedure. He is scheduled for 03/20/2019 at 8:30am. Endo aware. New instructions mailed to patient.

## 2019-01-02 NOTE — Progress Notes (Signed)
cc'ed to pcp °

## 2019-01-04 ENCOUNTER — Ambulatory Visit (HOSPITAL_COMMUNITY)
Admission: RE | Admit: 2019-01-04 | Discharge: 2019-01-04 | Disposition: A | Discharge status: Home / Self Care | Payer: Medicare Other | Source: Ambulatory Visit | Attending: Gastroenterology | Admitting: Gastroenterology

## 2019-01-04 ENCOUNTER — Other Ambulatory Visit: Payer: Self-pay

## 2019-01-04 DIAGNOSIS — K769 Liver disease, unspecified: Secondary | ICD-10-CM | POA: Insufficient documentation

## 2019-01-08 ENCOUNTER — Encounter: Payer: Self-pay | Admitting: *Deleted

## 2019-01-08 NOTE — Progress Notes (Signed)
Liver lesion most likely hemangioma. We need to see if he can have his colonoscopy moved up. Concerning clinical signs and diagnostic colonoscopy needs to be done soon.

## 2019-01-15 ENCOUNTER — Other Ambulatory Visit: Payer: Self-pay

## 2019-01-15 ENCOUNTER — Other Ambulatory Visit (HOSPITAL_COMMUNITY)
Admission: RE | Admit: 2019-01-15 | Discharge: 2019-01-15 | Disposition: A | Discharge status: Home / Self Care | Payer: Medicare Other | Source: Ambulatory Visit | Attending: Internal Medicine | Admitting: Internal Medicine

## 2019-01-15 DIAGNOSIS — Z01812 Encounter for preprocedural laboratory examination: Secondary | ICD-10-CM | POA: Insufficient documentation

## 2019-01-15 DIAGNOSIS — Z20828 Contact with and (suspected) exposure to other viral communicable diseases: Secondary | ICD-10-CM | POA: Diagnosis not present

## 2019-01-15 LAB — SARS CORONAVIRUS 2 (TAT 6-24 HRS): SARS Coronavirus 2: NEGATIVE

## 2019-01-16 ENCOUNTER — Telehealth: Payer: Self-pay | Admitting: Internal Medicine

## 2019-01-16 MED ORDER — PEG 3350-KCL-NA BICARB-NACL 420 G PO SOLR: 4000.0000 mL | ORAL | 0 refills | Status: DC

## 2019-01-16 NOTE — Telephone Encounter (Signed)
Pt called to say that he had his covid test yesterday, he received a call from his dentist today saying that he had been exposed to a covid positive person at their office and he needed to quarantine for 14 days. Pt said he needed to cancel his procedure with RMR for tomorrow and reschedule. [phone] or [phone]

## 2019-01-16 NOTE — Telephone Encounter (Signed)
Called pt. He states he went to the dentist after he tested for COVID and the assistant that worked him up was tested and was positive per pt. He reports they advised him to quarantine for 14 days. Patient has been rescheduled to 12/15 at 9:30am. Aware will mail new covid testing appt and instructions to him. He has also mixed prep. New Rx sent in for pt. Endo aware of appt change

## 2019-02-05 ENCOUNTER — Encounter: Payer: Self-pay | Admitting: Gastroenterology

## 2019-02-05 NOTE — Progress Notes (Signed)
TSH results received dated 12/26/2018: 16.62. Patient is taking Synthroid. Was his synthroid adjusted?

## 2019-02-06 NOTE — Progress Notes (Signed)
Pt notified that labs were reviewed. Pt's synthroid was adjusted. Pt was taking Synthroid 175 daily and is no on Synthorid 200 mg daily.

## 2019-02-09 ENCOUNTER — Other Ambulatory Visit (HOSPITAL_COMMUNITY)
Admission: RE | Admit: 2019-02-09 | Discharge: 2019-02-09 | Disposition: A | Discharge status: Home / Self Care | Payer: Medicare Other | Source: Ambulatory Visit | Attending: Internal Medicine | Admitting: Internal Medicine

## 2019-02-09 ENCOUNTER — Other Ambulatory Visit: Payer: Self-pay

## 2019-02-12 ENCOUNTER — Telehealth: Payer: Self-pay

## 2019-02-12 ENCOUNTER — Other Ambulatory Visit: Payer: Self-pay

## 2019-02-12 ENCOUNTER — Other Ambulatory Visit (HOSPITAL_COMMUNITY)
Admission: RE | Admit: 2019-02-12 | Discharge: 2019-02-12 | Disposition: A | Discharge status: Home / Self Care | Payer: Medicare Other | Source: Ambulatory Visit | Attending: Internal Medicine | Admitting: Internal Medicine

## 2019-02-12 NOTE — Telephone Encounter (Signed)
Melanie at [facility] called office, pt was no show for COVID test 02/09/19. Called pt, he forgot about COVID test appt. Appt scheduled this morning at 9:25am. LMOVM to inform endo scheduler. Threasa Beards also informed.

## 2019-02-13 ENCOUNTER — Encounter (HOSPITAL_COMMUNITY): Admission: RE | Payer: Self-pay | Source: Home / Self Care

## 2019-02-13 ENCOUNTER — Ambulatory Visit (HOSPITAL_COMMUNITY): Admission: RE | Admit: 2019-02-13 | Payer: Medicare Other | Source: Home / Self Care | Admitting: Internal Medicine

## 2019-02-13 SURGERY — COLONOSCOPY
Anesthesia: Moderate Sedation

## 2019-02-13 NOTE — OR Nursing (Signed)
Patient does not have a Covid result for his procedure this morning. Notified [facility] lab and [facility] lab and they do not have a swab for the patient. Called the patient and asked him if he came for his nasal swab yesterday and he said no. Informed patient that he could not have his procedure done because he did not have his Covid test. Patient verbalized understanding. Notified Mindy at the office of the above.

## 2019-02-13 NOTE — Telephone Encounter (Signed)
Tried to call pt. N/A. Letter mailed to schedule appointment.

## 2019-02-13 NOTE — Telephone Encounter (Signed)
Yes I think any of our patients having difficulty following simple instructions need a  face-to-face office visit

## 2019-02-13 NOTE — Telephone Encounter (Signed)
Reviewed

## 2019-02-13 NOTE — Telephone Encounter (Signed)
Melanie called from endo. She spoke with patient yesterday afternoon and he advised her he did go for COVID testing yesterday. Threasa Beards came in this morning and no results in patient chart. She called [facility] and [facility]. No one had results. She called patient and she asked him if he went for testing and then he stated "no". She advised him he would not be able to have procedure done and she would let the office know. She asked him he if already "prepped" and he stated he did.   Patient rescheduled prior stating he was exposed to covid at his dentist office. Called patient at home # and a male answered. I asked to speak to patient and "hold on I think they are not here", called mobile, NA and not able to leave VM.   Dr. Gala Romney, does patient need OV to r/s? Thanks

## 2019-03-16 ENCOUNTER — Other Ambulatory Visit (HOSPITAL_COMMUNITY): Payer: Medicare Other

## 2020-08-04 ENCOUNTER — Emergency Department (HOSPITAL_COMMUNITY)
Admission: EM | Admit: 2020-08-04 | Discharge: 2020-08-04 | Disposition: A | Discharge status: Home / Self Care | Payer: Medicare Other | Attending: Emergency Medicine | Admitting: Emergency Medicine

## 2020-08-04 ENCOUNTER — Emergency Department (HOSPITAL_COMMUNITY): Payer: Medicare Other

## 2020-08-04 ENCOUNTER — Encounter (HOSPITAL_COMMUNITY): Payer: Self-pay

## 2020-08-04 ENCOUNTER — Other Ambulatory Visit: Payer: Self-pay

## 2020-08-04 DIAGNOSIS — R0789 Other chest pain: Secondary | ICD-10-CM | POA: Diagnosis not present

## 2020-08-04 DIAGNOSIS — E039 Hypothyroidism, unspecified: Secondary | ICD-10-CM | POA: Diagnosis not present

## 2020-08-04 DIAGNOSIS — C3490 Malignant neoplasm of unspecified part of unspecified bronchus or lung: Secondary | ICD-10-CM | POA: Insufficient documentation

## 2020-08-04 DIAGNOSIS — F17210 Nicotine dependence, cigarettes, uncomplicated: Secondary | ICD-10-CM | POA: Diagnosis not present

## 2020-08-04 DIAGNOSIS — J45909 Unspecified asthma, uncomplicated: Secondary | ICD-10-CM | POA: Insufficient documentation

## 2020-08-04 DIAGNOSIS — Z79899 Other long term (current) drug therapy: Secondary | ICD-10-CM | POA: Diagnosis not present

## 2020-08-04 DIAGNOSIS — R202 Paresthesia of skin: Secondary | ICD-10-CM | POA: Diagnosis present

## 2020-08-04 DIAGNOSIS — D491 Neoplasm of unspecified behavior of respiratory system: Secondary | ICD-10-CM

## 2020-08-04 LAB — CBC WITH DIFFERENTIAL/PLATELET
Abs Immature Granulocytes: 0.03 10*3/uL (ref 0.00–0.07)
Basophils Absolute: 0 10*3/uL (ref 0.0–0.1)
Basophils Relative: 1 %
Eosinophils Absolute: 0.1 10*3/uL (ref 0.0–0.5)
Eosinophils Relative: 2 %
HCT: 42.9 % (ref 39.0–52.0)
Hemoglobin: 14.1 g/dL (ref 13.0–17.0)
Immature Granulocytes: 1 %
Lymphocytes Relative: 35 %
Lymphs Abs: 1.9 10*3/uL (ref 0.7–4.0)
MCH: 33.1 pg (ref 26.0–34.0)
MCHC: 32.9 g/dL (ref 30.0–36.0)
MCV: 100.7 fL — ABNORMAL HIGH (ref 80.0–100.0)
Monocytes Absolute: 0.5 10*3/uL (ref 0.1–1.0)
Monocytes Relative: 10 %
Neutro Abs: 2.9 10*3/uL (ref 1.7–7.7)
Neutrophils Relative %: 51 %
Platelets: 295 10*3/uL (ref 150–400)
RBC: 4.26 MIL/uL (ref 4.22–5.81)
RDW: 15.9 % — ABNORMAL HIGH (ref 11.5–15.5)
WBC: 5.5 10*3/uL (ref 4.0–10.5)
nRBC: 0 % (ref 0.0–0.2)

## 2020-08-04 LAB — TROPONIN I (HIGH SENSITIVITY)
Troponin I (High Sensitivity): 2 ng/L (ref <18)
Troponin I (High Sensitivity): <2 ng/L (ref <18)

## 2020-08-04 LAB — COMPREHENSIVE METABOLIC PANEL
ALT: 15 U/L (ref 0–44)
AST: 22 U/L (ref 15–41)
Albumin: 3.1 g/dL — ABNORMAL LOW (ref 3.5–5.0)
Alkaline Phosphatase: 40 U/L (ref 38–126)
Anion gap: 5 (ref 5–15)
BUN: 15 mg/dL (ref 8–23)
CO2: 28 mmol/L (ref 22–32)
Calcium: 8.7 mg/dL — ABNORMAL LOW (ref 8.9–10.3)
Chloride: 101 mmol/L (ref 98–111)
Creatinine, Ser: 1.1 mg/dL (ref 0.61–1.24)
GFR, Estimated: >60 mL/min (ref >60)
Glucose, Bld: 86 mg/dL (ref 70–99)
Potassium: 3.9 mmol/L (ref 3.5–5.1)
Sodium: 134 mmol/L — ABNORMAL LOW (ref 135–145)
Total Bilirubin: 0.3 mg/dL (ref 0.3–1.2)
Total Protein: 6.9 g/dL (ref 6.5–8.1)

## 2020-08-04 LAB — BRAIN NATRIURETIC PEPTIDE: B Natriuretic Peptide: 10 pg/mL (ref 0.0–100.0)

## 2020-08-04 MED ORDER — OXYCODONE HCL 5 MG PO TABS
5.0000 mg | ORAL_TABLET | Freq: Once | ORAL | Status: AC
  Administered 2020-08-04: 5 mg via ORAL
  Filled 2020-08-04: qty 1

## 2020-08-04 MED ORDER — FENTANYL CITRATE (PF) 100 MCG/2ML IJ SOLN
50.0000 ug | Freq: Once | INTRAMUSCULAR | Status: DC
  Filled 2020-08-04: qty 2

## 2020-08-04 MED ORDER — OXYCODONE-ACETAMINOPHEN 5-325 MG PO TABS: 1.0000 | ORAL_TABLET | Freq: Once | ORAL | Status: DC

## 2020-08-04 MED ORDER — OXYCODONE-ACETAMINOPHEN 5-325 MG PO TABS: 2.0000 | ORAL_TABLET | Freq: Four times a day (QID) | ORAL | 0 refills | Status: AC | PRN

## 2020-08-04 MED ORDER — ACETAMINOPHEN 325 MG PO TABS
650.0000 mg | ORAL_TABLET | Freq: Once | ORAL | Status: AC
  Administered 2020-08-04: 650 mg via ORAL
  Filled 2020-08-04: qty 2

## 2020-08-04 MED ORDER — IOHEXOL 350 MG/ML SOLN
100.0000 mL | Freq: Once | INTRAVENOUS | Status: AC | PRN
  Administered 2020-08-04: 100 mL via INTRAVENOUS

## 2020-08-04 MED ORDER — LIDOCAINE VISCOUS HCL 2 % MT SOLN
15.0000 mL | Freq: Once | OROMUCOSAL | Status: AC
  Administered 2020-08-04: 15 mL via ORAL
  Filled 2020-08-04: qty 15

## 2020-08-04 MED ORDER — ALUM & MAG HYDROXIDE-SIMETH 200-200-20 MG/5ML PO SUSP
30.0000 mL | Freq: Once | ORAL | Status: AC
  Administered 2020-08-04: 30 mL via ORAL
  Filled 2020-08-04: qty 30

## 2020-08-04 NOTE — ED Provider Notes (Signed)
[facility] EMERGENCY DEPARTMENT Provider Note   CSN: [id number] Arrival date & time: 08/04/20  1311     History Chief Complaint  Patient presents with  . Chest Pain    Miguel Colon is a 65 y.o. male.  HPI 65 year old male with a history of asthma, back pain, migraines, thyroid disease, hypercholesterolemia, chronic back pain [place] to the ER with complaints of left-sided arm tingling which began last night and left-sided chest pain with onset at around 5 AM this morning.  Pain seems to come and go, relieved with laying down.  No prior history cardiac history.  Patient states he feels very winded and tired even when he walks to the fridge.  Denies any cough, shortness of breath, fevers, chills.  No noticeable leg swelling.  No back pain, dizziness, syncope.    Past Medical History:  Diagnosis Date  . Asthma   . Back pain   . Migraines   . Thyroid disease     Patient Active Problem List   Diagnosis Date Noted  . Constipation 12/27/2018  . Lung nodules 12/27/2018  . Loss of weight 12/27/2018  . Rectal bleeding 12/27/2018  . Chronic back pain 10/22/2014  . Hypercholesteremia 10/22/2014  . Hypothyroidism 10/22/2014  . Bulging lumbar disc 09/24/2014  . Renal failure 09/24/2014    Past Surgical History:  Procedure Laterality Date  . THYROIDECTOMY         Family History  Problem Relation Age of Onset  . Hypertension Mother   . Hypertension Father   . Colon cancer Father 68    Social History   Tobacco Use  . Smoking status: Current Every Day Smoker    Packs/day: 0.50    Types: Cigarettes  . Smokeless tobacco: Never Used  . Tobacco comment: down to 6 cigarettes a day   Vaping Use  . Vaping Use: Never used  Substance Use Topics  . Alcohol use: No  . Drug use: No    Home Medications Prior to Admission medications   Medication Sig Start Date End Date Taking? Authorizing Provider  atorvastatin (LIPITOR) 20 MG tablet Take 20 mg by mouth daily at 6 PM.    Yes  [provider]  levothyroxine (SYNTHROID) 200 MCG tablet Take 200 mcg by mouth daily before breakfast.   Yes [provider]  oxyCODONE-acetaminophen (PERCOCET/ROXICET) 5-325 MG tablet Take 2 tablets by mouth every 6 (six) hours as needed for up to 5 days for severe pain. 08/04/20 08/09/20 Yes Garald Balding, PA-C  polyethylene glycol-electrolytes (TRILYTE) 420 g solution Take 4,000 mLs by mouth as directed. Patient not taking: No sig reported 01/16/19   Rourk, Cristopher Estimable, MD    Allergies    Patient has no known allergies.  Review of Systems   Review of Systems  Constitutional: Negative for chills and fever.  HENT: Negative for ear pain and sore throat.   Eyes: Negative for pain and visual disturbance.  Respiratory: Negative for cough and shortness of breath.   Cardiovascular: Positive for chest pain. Negative for palpitations.  Gastrointestinal: Negative for abdominal pain and vomiting.  Genitourinary: Negative for dysuria and hematuria.  Musculoskeletal: Negative for arthralgias and back pain.  Skin: Negative for color change and rash.  Neurological: Negative for seizures and syncope.  All other systems reviewed and are negative.   Physical Exam Updated Vital Signs BP 128/90   Pulse (!) 58   Temp 98 F (36.7 C) (Oral)   Resp 19   Ht 5\' 9"  (1.753  m)   Wt 72.6 kg   SpO2 98%   BMI 23.63 kg/m   Physical Exam Vitals and nursing note reviewed.  Constitutional:      General: He is not in acute distress.    Appearance: He is well-developed. He is not ill-appearing, toxic-appearing or diaphoretic.  HENT:     Head: Normocephalic and atraumatic.  Eyes:     Conjunctiva/sclera: Conjunctivae normal.  Cardiovascular:     Rate and Rhythm: Normal rate and regular rhythm.     Pulses:          Radial pulses are 2+ on the right side and 2+ on the left side.     Heart sounds: No murmur heard.   Pulmonary:     Effort: Pulmonary effort is normal. No respiratory  distress.     Breath sounds: Normal breath sounds. No decreased breath sounds, wheezing, rhonchi or rales.  Abdominal:     Palpations: Abdomen is soft.     Tenderness: There is no abdominal tenderness.  Musculoskeletal:     Cervical back: Neck supple.     Right lower leg: No edema.     Left lower leg: No edema.  Skin:    General: Skin is warm and dry.  Neurological:     Mental Status: He is alert.     ED Results / Procedures / Treatments   Labs (all labs ordered are listed, but only abnormal results are displayed) Labs Reviewed  CBC WITH DIFFERENTIAL/PLATELET - Abnormal; Notable for the following components:      Result Value   MCV 100.7 (*)    RDW 15.9 (*)    All other components within normal limits  COMPREHENSIVE METABOLIC PANEL - Abnormal; Notable for the following components:   Sodium 134 (*)    Calcium 8.7 (*)    Albumin 3.1 (*)    All other components within normal limits  BRAIN NATRIURETIC PEPTIDE  TROPONIN I (HIGH SENSITIVITY)  TROPONIN I (HIGH SENSITIVITY)    EKG EKG Interpretation  Date/Time:  Monday August 04 2020 13:28:29 EDT Ventricular Rate:  60 PR Interval:  147 QRS Duration: 70 QT Interval:  395 QTC Calculation: 395 R Axis:   77 Text Interpretation: Sinus rhythm Minimal ST elevation, inferior leads No acute changes No significant change since last tracing Confirmed by Varney Biles ([id number]) on 08/04/2020 2:17:17 PM   Radiology DG Chest Portable 1 View  Result Date: 08/04/2020 CLINICAL DATA:  Chest pain. EXAM: PORTABLE CHEST 1 VIEW COMPARISON:  December 17, 2014. FINDINGS: The heart size and mediastinal contours are within normal limits. Emphysematous disease is noted. No pneumothorax or pleural effusion is noted. Stable right basilar scarring is noted. No acute abnormality is noted. The visualized skeletal structures are unremarkable. IMPRESSION: No acute abnormality is noted. Emphysema (ICD10-J43.9). Electronically Signed   By: Marijo Conception M.D.    On: 08/04/2020 13:42   CT ANGIO CHEST AORTA W/CM & OR WO/CM  Result Date: 08/04/2020 CLINICAL DATA:  Left chest pain, evaluate for dissection EXAM: CT ANGIOGRAPHY CHEST WITH CONTRAST TECHNIQUE: Multidetector CT imaging of the chest was performed using the standard protocol during bolus administration of intravenous contrast. Multiplanar CT image reconstructions and MIPs were obtained to evaluate the vascular anatomy. CONTRAST:  157mL OMNIPAQUE IOHEXOL 350 MG/ML SOLN COMPARISON:  12/28/2018 FINDINGS: Cardiovascular: No evidence thoracic aortic aneurysm or dissection. Very mild atherosclerotic calcifications of the aortic arch. Study is not tailored for evaluation of the pulmonary arteries. There is no evidence of central  pulmonary embolism. The heart is normal in size.  No pericardial effusion. Mild coronary atherosclerosis of the LAD. Mediastinum/Nodes: No suspicious mediastinal lymphadenopathy. Visualized thyroid is unremarkable. Lungs/Pleura: 4.8 x 5.5 x 4.0 cm mass at the left lung apex (series 4/image 14), new, compatible with primary bronchogenic neoplasm/Pancoast tumor. 11 mm noncalcified left lower lobe nodule (series 7/image 137), unchanged, benign. Additional 2.3 cm calcified nodule in the right upper lobe (series 7/image 31) and partially obscured nodular scarring in the left upper lobe (series 7/image 31). Moderate centrilobular and paraseptal emphysematous changes, upper lung predominant. No focal consolidation. Mild scarring/atelectasis in the right lower lobe. No pleural effusion or pneumothorax. Upper Abdomen: Visualized upper abdomen is grossly unremarkable. Musculoskeletal: Mild degenerative changes of the lower thoracic spine. Review of the MIP images confirms the above findings. IMPRESSION: No evidence of thoracic aortic aneurysm or dissection. 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 No findings specific for metastatic disease. Aortic  Atherosclerosis (ICD10-I70.0) and Emphysema (ICD10-J43.9). Electronically Signed   By: Julian Hy M.D.   On: 08/04/2020 19:01    Procedures Procedures   Medications Ordered in ED Medications  alum & mag hydroxide-simeth (MAALOX/MYLANTA) 200-200-20 MG/5ML suspension 30 mL (30 mLs Oral Given 08/04/20 1520)    And  lidocaine (XYLOCAINE) 2 % viscous mouth solution 15 mL (15 mLs Oral Given 08/04/20 1520)  acetaminophen (TYLENOL) tablet 650 mg (650 mg Oral Given 08/04/20 1930)  iohexol (OMNIPAQUE) 350 MG/ML injection 100 mL (100 mLs Intravenous Contrast Given 08/04/20 1827)  oxyCODONE (Oxy IR/ROXICODONE) immediate release tablet 5 mg (5 mg Oral Given 08/04/20 2022)    ED Course  I have reviewed the triage vital signs and the nursing notes.  Pertinent labs & imaging results that were available during my care of the patient were reviewed by me and considered in my medical decision making (see chart for details).  Clinical Course as of 08/04/20 2051  Mon Jun 06, 10465  743 65 year old male presents to the ER with complaints of left-sided chest pain and left arm tingling.  On arrival, he is well-appearing, no acute distress, resting comfortably in ER bed.  Speaking full sentences without increased work of breathing.  Vitals overall reassuring.  Plan for basic labs, BNP, troponin, chest x-ray, EKG [MB]  1332 DDx includes ACS, PE, dissection, GERD, musculoskeletal pain, pneumonia [MB]  1422 ED EKG No significant changes from prior, reviewed by myself and supervising MD Dr. Kathrynn Humble  [MB]  1800 BNP, delta troponins are negative.  Patient continues to complain of left-sided chest pain and left arm pain.  He states he feels like he is "not circulating well".  2+ radial pulses, less than 2 cap refill to bilateral arms.  Refusing fentanyl.  Patient was also seen and evaluated Dr. Vanita Panda, will order CT a of the chest to evaluate for possible dissection/vascular injury [MB]  1917 CT ANGIO CHEST AORTA W/CM &  OR WO/CM IMPRESSION: No evidence of thoracic aortic aneurysm or dissection.  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  No findings specific for metastatic disease.   [MB]  2048 Patient continued to complain of pain.  Received Tylenol oxycodone, on reevaluation, patient states no significant change in pain.  Unfortunately I do not think we will be able to adequately treat his pain here today.  Patient refusing IV pain medicines.  Will prescribe short course of Percocet, PDMP reviewed, appropriate.  Patient plans to follow-up with PCP  tomorrow for CT chest findings.  We discussed return precautions.  Stable for discharge  This was a shared visit with my supervising physician Dr. Vanita Panda who independently saw and evaluated the patient & provided guidance in evaluation/management/disposition ,in agreement with care  [MB]    Clinical Course User Index [MB] Lyndel Safe   MDM Rules/Calculators/A&P                           Final Clinical Impression(s) / ED Diagnoses Final diagnoses:  Lung neoplasm    Rx / DC Orders ED Discharge Orders         Ordered    oxyCODONE-acetaminophen (PERCOCET/ROXICET) 5-325 MG tablet  Every 6 hours PRN        08/04/20 2050           Garald Balding, PA-C 08/04/20 2051    Carmin Muskrat, MD 08/04/20 [phone]

## 2020-08-04 NOTE — ED Triage Notes (Signed)
Pt reports left hand and arm tingling since 5pm yesterday.  Reports left sided chest pain since waking up around 5 am.

## 2020-08-04 NOTE — ED Notes (Signed)
Pt ambulated to the bathroom.  

## 2020-08-04 NOTE — Discharge Instructions (Signed)
You were evaluated in the Emergency Department and after careful evaluation, we did not find any emergent condition requiring admission or further testing in the hospital.  Your work-up today did show a possible tumor on your left lung.  It is very important that you follow-up with your primary care doctor for further evaluation of this.  Please take the prescribed Percocet as needed for pain.  Please return to the Emergency Department if you experience any worsening of your condition.   Thank you for allowing [place] to be a part of your care.

## 2020-08-04 NOTE — ED Notes (Signed)
Refused fentanyl

## 2020-09-02 ENCOUNTER — Encounter (HOSPITAL_COMMUNITY): Payer: Self-pay

## 2020-09-02 ENCOUNTER — Emergency Department (HOSPITAL_COMMUNITY): Payer: Medicare Other

## 2020-09-02 ENCOUNTER — Other Ambulatory Visit: Payer: Self-pay

## 2020-09-02 ENCOUNTER — Inpatient Hospital Stay (HOSPITAL_COMMUNITY)
Admission: EM | Admit: 2020-09-02 | Discharge: 2020-09-09 | DRG: 181 | Disposition: A | Discharge status: Home / Self Care | Payer: Medicare Other | Attending: Internal Medicine | Admitting: Internal Medicine

## 2020-09-02 DIAGNOSIS — G893 Neoplasm related pain (acute) (chronic): Secondary | ICD-10-CM | POA: Diagnosis present

## 2020-09-02 DIAGNOSIS — F17210 Nicotine dependence, cigarettes, uncomplicated: Secondary | ICD-10-CM | POA: Diagnosis present

## 2020-09-02 DIAGNOSIS — M5031 Other cervical disc degeneration,  high cervical region: Secondary | ICD-10-CM | POA: Diagnosis present

## 2020-09-02 DIAGNOSIS — Z79890 Hormone replacement therapy (postmenopausal): Secondary | ICD-10-CM

## 2020-09-02 DIAGNOSIS — M4802 Spinal stenosis, cervical region: Secondary | ICD-10-CM | POA: Diagnosis present

## 2020-09-02 DIAGNOSIS — G8929 Other chronic pain: Secondary | ICD-10-CM | POA: Diagnosis not present

## 2020-09-02 DIAGNOSIS — R918 Other nonspecific abnormal finding of lung field: Secondary | ICD-10-CM | POA: Diagnosis not present

## 2020-09-02 DIAGNOSIS — Z9119 Patient's noncompliance with other medical treatment and regimen: Secondary | ICD-10-CM

## 2020-09-02 DIAGNOSIS — M9961 Osseous and subluxation stenosis of intervertebral foramina of cervical region: Secondary | ICD-10-CM | POA: Diagnosis not present

## 2020-09-02 DIAGNOSIS — C3412 Malignant neoplasm of upper lobe, left bronchus or lung: Secondary | ICD-10-CM | POA: Diagnosis present

## 2020-09-02 DIAGNOSIS — Z79899 Other long term (current) drug therapy: Secondary | ICD-10-CM

## 2020-09-02 DIAGNOSIS — M4712 Other spondylosis with myelopathy, cervical region: Secondary | ICD-10-CM | POA: Diagnosis present

## 2020-09-02 DIAGNOSIS — Z20822 Contact with and (suspected) exposure to covid-19: Secondary | ICD-10-CM | POA: Diagnosis present

## 2020-09-02 DIAGNOSIS — M79603 Pain in arm, unspecified: Secondary | ICD-10-CM | POA: Diagnosis not present

## 2020-09-02 DIAGNOSIS — Z800 Family history of malignant neoplasm of digestive organs: Secondary | ICD-10-CM

## 2020-09-02 DIAGNOSIS — K5900 Constipation, unspecified: Secondary | ICD-10-CM | POA: Diagnosis present

## 2020-09-02 DIAGNOSIS — Z9114 Patient's other noncompliance with medication regimen: Secondary | ICD-10-CM | POA: Diagnosis not present

## 2020-09-02 DIAGNOSIS — Z8249 Family history of ischemic heart disease and other diseases of the circulatory system: Secondary | ICD-10-CM | POA: Diagnosis not present

## 2020-09-02 DIAGNOSIS — Z515 Encounter for palliative care: Secondary | ICD-10-CM

## 2020-09-02 DIAGNOSIS — E039 Hypothyroidism, unspecified: Secondary | ICD-10-CM | POA: Diagnosis present

## 2020-09-02 DIAGNOSIS — E890 Postprocedural hypothyroidism: Secondary | ICD-10-CM | POA: Diagnosis present

## 2020-09-02 DIAGNOSIS — E7800 Pure hypercholesterolemia, unspecified: Secondary | ICD-10-CM | POA: Diagnosis present

## 2020-09-02 DIAGNOSIS — J45909 Unspecified asthma, uncomplicated: Secondary | ICD-10-CM | POA: Diagnosis present

## 2020-09-02 DIAGNOSIS — M549 Dorsalgia, unspecified: Secondary | ICD-10-CM | POA: Diagnosis present

## 2020-09-02 DIAGNOSIS — M5440 Lumbago with sciatica, unspecified side: Secondary | ICD-10-CM | POA: Diagnosis not present

## 2020-09-02 DIAGNOSIS — M25519 Pain in unspecified shoulder: Secondary | ICD-10-CM | POA: Diagnosis not present

## 2020-09-02 DIAGNOSIS — M5001 Cervical disc disorder with myelopathy,  high cervical region: Secondary | ICD-10-CM | POA: Diagnosis present

## 2020-09-02 DIAGNOSIS — E038 Other specified hypothyroidism: Secondary | ICD-10-CM | POA: Diagnosis not present

## 2020-09-02 LAB — CBC WITH DIFFERENTIAL/PLATELET
Abs Immature Granulocytes: 0.02 10*3/uL (ref 0.00–0.07)
Basophils Absolute: 0 10*3/uL (ref 0.0–0.1)
Basophils Relative: 1 %
Eosinophils Absolute: 0.1 10*3/uL (ref 0.0–0.5)
Eosinophils Relative: 3 %
HCT: 41.3 % (ref 39.0–52.0)
Hemoglobin: 13.8 g/dL (ref 13.0–17.0)
Immature Granulocytes: 0 %
Lymphocytes Relative: 41 %
Lymphs Abs: 2 10*3/uL (ref 0.7–4.0)
MCH: 33.2 pg (ref 26.0–34.0)
MCHC: 33.4 g/dL (ref 30.0–36.0)
MCV: 99.3 fL (ref 80.0–100.0)
Monocytes Absolute: 0.4 10*3/uL (ref 0.1–1.0)
Monocytes Relative: 9 %
Neutro Abs: 2.3 10*3/uL (ref 1.7–7.7)
Neutrophils Relative %: 46 %
Platelets: 253 10*3/uL (ref 150–400)
RBC: 4.16 MIL/uL — ABNORMAL LOW (ref 4.22–5.81)
RDW: 15.9 % — ABNORMAL HIGH (ref 11.5–15.5)
WBC: 4.8 10*3/uL (ref 4.0–10.5)
nRBC: 0 % (ref 0.0–0.2)

## 2020-09-02 LAB — COMPREHENSIVE METABOLIC PANEL
ALT: 21 U/L (ref 0–44)
AST: 30 U/L (ref 15–41)
Albumin: 3.6 g/dL (ref 3.5–5.0)
Alkaline Phosphatase: 36 U/L — ABNORMAL LOW (ref 38–126)
Anion gap: 9 (ref 5–15)
BUN: 16 mg/dL (ref 8–23)
CO2: 26 mmol/L (ref 22–32)
Calcium: 8.6 mg/dL — ABNORMAL LOW (ref 8.9–10.3)
Chloride: 102 mmol/L (ref 98–111)
Creatinine, Ser: 0.99 mg/dL (ref 0.61–1.24)
GFR, Estimated: >60 mL/min (ref >60)
Glucose, Bld: 83 mg/dL (ref 70–99)
Potassium: 3.6 mmol/L (ref 3.5–5.1)
Sodium: 137 mmol/L (ref 135–145)
Total Bilirubin: 0.5 mg/dL (ref 0.3–1.2)
Total Protein: 7.7 g/dL (ref 6.5–8.1)

## 2020-09-02 LAB — RAPID URINE DRUG SCREEN, HOSP PERFORMED
Amphetamines: NOT DETECTED
Barbiturates: NOT DETECTED
Benzodiazepines: NOT DETECTED
Cocaine: POSITIVE — AB
Opiates: POSITIVE — AB
Tetrahydrocannabinol: NOT DETECTED

## 2020-09-02 LAB — URINALYSIS, ROUTINE W REFLEX MICROSCOPIC
Bilirubin Urine: NEGATIVE
Glucose, UA: NEGATIVE mg/dL
Hgb urine dipstick: NEGATIVE
Ketones, ur: NEGATIVE mg/dL
Leukocytes,Ua: NEGATIVE
Nitrite: NEGATIVE
Protein, ur: NEGATIVE mg/dL
Specific Gravity, Urine: 1.021 (ref 1.005–1.030)
pH: 6 (ref 5.0–8.0)

## 2020-09-02 LAB — HIV ANTIBODY (ROUTINE TESTING W REFLEX): HIV Screen 4th Generation wRfx: NONREACTIVE

## 2020-09-02 MED ORDER — MORPHINE SULFATE (PF) 4 MG/ML IV SOLN
4.0000 mg | INTRAVENOUS | Status: DC
Start: 2020-09-02 — End: 2020-09-03
  Administered 2020-09-02 (×2): 4 mg via INTRAVENOUS
  Filled 2020-09-02 (×3): qty 1

## 2020-09-02 MED ORDER — LEVOTHYROXINE SODIUM 100 MCG PO TABS
200.0000 ug | ORAL_TABLET | Freq: Every day | ORAL | Status: DC
  Administered 2020-09-03 – 2020-09-09 (×7): 200 ug via ORAL
  Filled 2020-09-02: qty 2
  Filled 2020-09-02: qty 4
  Filled 2020-09-02 (×5): qty 2

## 2020-09-02 MED ORDER — HEPARIN SODIUM (PORCINE) 5000 UNIT/ML IJ SOLN
5000.0000 [IU] | Freq: Three times a day (TID) | INTRAMUSCULAR | Status: DC
  Administered 2020-09-02 – 2020-09-04 (×6): 5000 [IU] via SUBCUTANEOUS
  Filled 2020-09-02 (×7): qty 1

## 2020-09-02 MED ORDER — ACETAMINOPHEN 650 MG RE SUPP: 650.0000 mg | Freq: Four times a day (QID) | RECTAL | Status: DC | PRN

## 2020-09-02 MED ORDER — SENNA 8.6 MG PO TABS
1.0000 | ORAL_TABLET | Freq: Two times a day (BID) | ORAL | Status: DC
  Administered 2020-09-03 – 2020-09-07 (×7): 8.6 mg via ORAL
  Filled 2020-09-02 (×7): qty 1

## 2020-09-02 MED ORDER — TRAZODONE HCL 50 MG PO TABS
25.0000 mg | ORAL_TABLET | Freq: Every evening | ORAL | Status: DC | PRN
  Administered 2020-09-02 – 2020-09-05 (×2): 25 mg via ORAL
  Filled 2020-09-02 (×2): qty 1

## 2020-09-02 MED ORDER — ATORVASTATIN CALCIUM 20 MG PO TABS
20.0000 mg | ORAL_TABLET | Freq: Every day | ORAL | Status: DC
  Administered 2020-09-02 – 2020-09-08 (×7): 20 mg via ORAL
  Filled 2020-09-02 (×6): qty 1
  Filled 2020-09-02: qty 2

## 2020-09-02 MED ORDER — NICOTINE 14 MG/24HR TD PT24
14.0000 mg | MEDICATED_PATCH | Freq: Every day | TRANSDERMAL | Status: DC
  Administered 2020-09-02 – 2020-09-09 (×8): 14 mg via TRANSDERMAL
  Filled 2020-09-02 (×9): qty 1

## 2020-09-02 MED ORDER — ACETAMINOPHEN 325 MG PO TABS: 650.0000 mg | ORAL_TABLET | Freq: Four times a day (QID) | ORAL | Status: DC | PRN

## 2020-09-02 MED ORDER — ALPRAZOLAM 0.25 MG PO TABS: 0.2500 mg | ORAL_TABLET | Freq: Three times a day (TID) | ORAL | Status: DC | PRN

## 2020-09-02 MED ORDER — MORPHINE SULFATE (PF) 4 MG/ML IV SOLN
6.0000 mg | Freq: Once | INTRAVENOUS | Status: AC
  Administered 2020-09-02: 6 mg via INTRAVENOUS
  Filled 2020-09-02: qty 2

## 2020-09-02 MED ORDER — DEXTROSE-NACL 5-0.45 % IV SOLN: INTRAVENOUS | Status: DC

## 2020-09-02 MED ORDER — MORPHINE SULFATE (PF) 4 MG/ML IV SOLN: 4.0000 mg | INTRAVENOUS | Status: DC | PRN

## 2020-09-02 NOTE — ED Notes (Signed)
Urine requested. Pt states he can not go at this time

## 2020-09-02 NOTE — ED Notes (Signed)
Pt to MRI

## 2020-09-02 NOTE — ED Notes (Signed)
Urinal provided to pt. Urine sample requested.

## 2020-09-02 NOTE — ED Provider Notes (Signed)
[facility] EMERGENCY DEPARTMENT Provider Note   CSN: [id number] Arrival date & time: 09/02/20  1210     History Chief Complaint  Patient presents with   Chest Pain    Jylan Loeza is a 65 y.o. male.  HPI  HPI: A 65 year old patient with a history of hypercholesterolemia presents for evaluation of chest pain. Initial onset of pain was more than 6 hours ago. The patient's chest pain is sharp and is not worse with exertion. The patient's chest pain is middle- or left-sided, is not well-localized, is not described as heaviness/pressure/tightness and does radiate to the arms/jaw/neck. The patient does not complain of nausea and denies diaphoresis. The patient has smoked in the past 90 days. The patient has no history of stroke, has no history of peripheral artery disease, denies any history of treated diabetes, has no relevant family history of coronary artery disease (first degree relative at less than age 62), is not hypertensive and does not have an elevated BMI (>=30).   Past Medical History:  Diagnosis Date   Asthma    Back pain    Migraines    Thyroid disease     Patient Active Problem List   Diagnosis Date Noted   Pancoast syndrome, left ([place]) 09/02/2020   Constipation 12/27/2018   Lung nodules 12/27/2018   Loss of weight 12/27/2018   Rectal bleeding 12/27/2018   Chronic back pain 10/22/2014   Hypercholesteremia 10/22/2014   Hypothyroidism 10/22/2014   Bulging lumbar disc 09/24/2014   Renal failure 09/24/2014    Past Surgical History:  Procedure Laterality Date   THYROIDECTOMY         Family History  Problem Relation Age of Onset   Hypertension Mother    Hypertension Father    Colon cancer Father 46    Social History   Tobacco Use   Smoking status: Every Day    Packs/day: 0.50    Pack years: 0.00    Types: Cigarettes   Smokeless tobacco: Never   Tobacco comments:    down to 6 cigarettes a day   Vaping Use   Vaping Use: Never used  Substance Use  Topics   Alcohol use: No   Drug use: No    Home Medications Prior to Admission medications   Medication Sig Start Date End Date Taking? Authorizing Provider  atorvastatin (LIPITOR) 20 MG tablet Take 20 mg by mouth daily at 6 PM.    Yes [provider]  levothyroxine (SYNTHROID) 200 MCG tablet Take 200 mcg by mouth daily before breakfast.   Yes [provider]  naproxen sodium (ALEVE) 220 MG tablet Take 220 mg by mouth daily as needed (pain).   Yes [provider]  polyethylene glycol-electrolytes (TRILYTE) 420 g solution Take 4,000 mLs by mouth as directed. Patient not taking: No sig reported 01/16/19   Rourk, Cristopher Estimable, MD    Allergies    Patient has no known allergies.  Review of Systems   Review of Systems  Constitutional:  Positive for activity change.  Cardiovascular:  Positive for chest pain.  All other systems reviewed and are negative.  Physical Exam Updated Vital Signs BP 119/88   Pulse 71   Temp 97.6 F (36.4 C) (Oral)   Resp 12   Ht 5\' 9"  (1.753 m)   Wt 72.6 kg   SpO2 91%   BMI 23.63 kg/m   Physical Exam Vitals and nursing note reviewed.  Constitutional:      Appearance: He is well-developed.  HENT:     Head: Atraumatic.  Cardiovascular:     Rate and Rhythm: Normal rate.  Pulmonary:     Effort: Pulmonary effort is normal.  Musculoskeletal:     Cervical back: Neck supple.     Comments: Pain with abduction of the left shoulder.  Patient has gross numbness  Skin:    General: Skin is warm.  Neurological:     Mental Status: He is alert and oriented to person, place, and time.    ED Results / Procedures / Treatments   Labs (all labs ordered are listed, but only abnormal results are displayed) Labs Reviewed  CBC WITH DIFFERENTIAL/PLATELET - Abnormal; Notable for the following components:      Result Value   RBC 4.16 (*)    RDW 15.9 (*)    All other components within normal limits  COMPREHENSIVE METABOLIC PANEL - Abnormal;  Notable for the following components:   Calcium 8.6 (*)    Alkaline Phosphatase 36 (*)    All other components within normal limits  SARS CORONAVIRUS 2 (TAT 6-24 HRS)  RAPID URINE DRUG SCREEN, HOSP PERFORMED  URINALYSIS, ROUTINE W REFLEX MICROSCOPIC  HIV ANTIBODY (ROUTINE TESTING W REFLEX)  CBC  CREATININE, SERUM    EKG EKG Interpretation  Date/Time:  Tuesday September 02 2020 12:20:39 EDT Ventricular Rate:  93 PR Interval:  145 QRS Duration: 97 QT Interval:  360 QTC Calculation: 448 R Axis:   86 Text Interpretation: Sinus rhythm Borderline right axis deviation Nonspecific T abnormalities, lateral leads No acute changes No significant change since last tracing Confirmed by Varney Biles [phone]) on 09/02/2020 1:05:33 PM  Radiology MR BRAIN WO CONTRAST  Result Date: 09/02/2020 CLINICAL DATA:  Left arm numbness. EXAM: MRI HEAD WITHOUT CONTRAST TECHNIQUE: Multiplanar, multiecho pulse sequences of the brain and surrounding structures were obtained without intravenous contrast. COMPARISON:  Head CT 12/17/2014 FINDINGS: Brain: There is no evidence of an acute infarct, intracranial hemorrhage, mass, midline shift, or extra-axial fluid collection. Small T2 hyperintensities in the cerebral white matter bilaterally are nonspecific but compatible with mild chronic small vessel ischemic disease. The ventricles and sulci are within normal limits for age. Vascular: Major intracranial vascular flow voids are preserved. Skull and upper cervical spine: Unremarkable bone marrow signal. Sinuses/Orbits: Unremarkable orbits. Paranasal sinuses and mastoid air cells are clear. Other: None. IMPRESSION: 1. No acute intracranial abnormality. 2. Mild chronic small vessel ischemic disease. Electronically Signed   By: Logan Bores M.D.   On: 09/02/2020 14:30   MR Cervical Spine Wo Contrast  Result Date: 09/02/2020 CLINICAL DATA:  Left arm numbness. EXAM: MRI CERVICAL SPINE WITHOUT CONTRAST TECHNIQUE: Multiplanar,  multisequence MR imaging of the cervical spine was performed. No intravenous contrast was administered. COMPARISON:  Chest CTA 08/04/2020 FINDINGS: The study is motion degraded including severe motion on axial T1 and T2 gradient echo sequences. Alignment: Trace retrolisthesis of C3 on C4. Straightening of the normal cervical lordosis. Vertebrae: No fracture or suspicious marrow lesion. Degenerative endplate changes at N0-2 including mild degenerative edema associated with moderate disc space narrowing. Cord: Abnormal T2 hyperintensity in the cord bilaterally at C3-4. Posterior Fossa, vertebral arteries, paraspinal tissues: Partial imaging of known left apical lung mass which invades into the soft tissues superior and posterior to the lung apex including the paraspinal soft tissues at T1-2. unremarkable included posterior fossa. Disc levels: C2-3: Disc bulging and right uncovertebral spurring result in mild right neural foraminal stenosis without significant spinal stenosis. C3-4: Disc bulging and uncovertebral spurring result  in severe spinal stenosis with severe cord flattening and severe bilateral neural foraminal stenosis. C4-5: Disc bulging, uncovertebral spurring, and mild facet arthrosis result in moderate right neural foraminal stenosis without significant spinal stenosis. C5-6: Mild disc bulging and left facet arthrosis without stenosis. C6-7: Negative. C7-T1: Negative. IMPRESSION: 1. Motion degraded examination. 2. Partial imaging of known invasive left apical lung mass which likely involves the left brachial plexus. 3. Cervical disc degeneration greatest at C3-4 where there is severe spinal stenosis and cord signal abnormality suggesting spondylotic myelopathy. 4. Severe bilateral neural foraminal stenosis at C3-4. Electronically Signed   By: Logan Bores M.D.   On: 09/02/2020 14:46    Procedures Procedures   Medications Ordered in ED Medications  atorvastatin (LIPITOR) tablet 20 mg (has no  administration in time range)  levothyroxine (SYNTHROID) tablet 200 mcg (has no administration in time range)  heparin injection 5,000 Units (has no administration in time range)  dextrose 5 %-0.45 % sodium chloride infusion (has no administration in time range)  acetaminophen (TYLENOL) tablet 650 mg (has no administration in time range)    Or  acetaminophen (TYLENOL) suppository 650 mg (has no administration in time range)  traZODone (DESYREL) tablet 25 mg (has no administration in time range)  senna (SENOKOT) tablet 8.6 mg (has no administration in time range)  morphine 4 MG/ML injection 4 mg (has no administration in time range)  morphine 4 MG/ML injection 6 mg (6 mg Intravenous Given 09/02/20 1424)    ED Course  I have reviewed the triage vital signs and the nursing notes.  Pertinent labs & imaging results that were available during my care of the patient were reviewed by me and considered in my medical decision making (see chart for details).    MDM Rules/Calculators/A&P HEAR Score: 2                         65 year old comes in a chief complaint of left upper extremity pain and numbness.  Symptoms were similar a month ago when he had come in, CT angio at that time showed left-sided tumor, concerns for neoplasm.  Patient has not had a chance to follow-up with PCP in the interim.  He comes in because over the last 2 days his symptoms have progressed.  Heart appearing pain, almost sounds like phantom pain.  No C-spine tenderness.  Given the numbness he is having, we are concerned that he could have stroke, or there could be cervical spine disease or brachial plexus disease.  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  Spoke with Dr. Benay Spice, oncology.  He is requesting that the patient be transferred to [facility].  His team will see the patient tomorrow.  Most likely  patient will also need biopsy of the lung nodule, which can be facilitated at the hospital.  Patient made aware of this finding.  Final Clinical Impression(s) / ED Diagnoses Final diagnoses:  None    Rx / DC Orders ED Discharge Orders     None        Varney Biles, MD 09/02/20 1606

## 2020-09-02 NOTE — ED Triage Notes (Signed)
Pt presents to ED with complaints of left sided chest pain, left arm numbness since yesterday. Pt also with SOB. Pt states he can't feel anything from his shoulder to his elbow.

## 2020-09-02 NOTE — H&P (Signed)
History and Physical    Miguel Colon HUT:[id number] DOB: [birth year] DOA: 09/02/2020  PCP: [name], MD (Confirm with patient/family/NH records and if not entered, this has to be entered at [facility] point of entry) Patient coming from: home  I have personally briefly reviewed patient's old medical records in [place]  Chief Complaint: left UE paresthesia with severe pain, left chest pain  HPI: Miguel Colon is a 65 y.o. 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 He has not yet had follow up. He has been having increasing numbness in the left UE and severe pain in the left chest and left UE. Due to his symptoms he presents to [facility] for evaluation.  (  ED Course: T97.6  115/95  HR 71  RR 12, Lab: Cmet - nl, CBCD nl. MRI brain NAD. MRI - C-spine: severe bilateral foraminal stenosis C3,4; severe spinal stenosis C3,4; Left apical lung mass involving brachial plexus.EDP spoke with Dr. Benay Spice for oncology who recommends the patient be admitted to [facility] hospital for oncology consult and work-up.[facility] called to admit  Review of Systems: As per HPI otherwise 10 point review of systems negative.    Past Medical History:  Diagnosis Date   Asthma    Back pain    Migraines    Thyroid disease     Past Surgical History:  Procedure Laterality Date   THYROIDECTOMY      Soc Hx - married and divorced but is reapproaching his former spouse. He has two daughters with whom he is in contact, he has 5 grandchildren. 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   reports that he has been smoking cigarettes. He has been smoking an average of 0.50 packs per day. He has never used smokeless tobacco. He reports that he does not drink alcohol and does not use drugs.  No Known Allergies  Family History  Problem Relation Age of Onset   Hypertension Mother    Hypertension Father    Colon cancer Father  53     Prior to Admission medications   Medication Sig Start Date End Date Taking? Authorizing Provider  atorvastatin (LIPITOR) 20 MG tablet Take 20 mg by mouth daily at 6 PM.    Yes [provider]  levothyroxine (SYNTHROID) 200 MCG tablet Take 200 mcg by mouth daily before breakfast.   Yes [provider]  naproxen sodium (ALEVE) 220 MG tablet Take 220 mg by mouth daily as needed (pain).   Yes [provider]  polyethylene glycol-electrolytes (TRILYTE) 420 g solution Take 4,000 mLs by mouth as directed. Patient not taking: No sig reported 01/16/19   Daneil Dolin, MD    Physical Exam: Vitals:   09/02/20 1230 09/02/20 1300 09/02/20 1430 09/02/20 1500  BP: (!) 117/94 (!) 115/95 (!) 118/92 119/88  Pulse: 87 85 74 71  Resp: 18 17 12 12   Temp:      TempSrc:      SpO2: 92% 94% 94% 91%  Weight:      Height:         Vitals:   09/02/20 1230 09/02/20 1300 09/02/20 1430 09/02/20 1500  BP: (!) 117/94 (!) 115/95 (!) 118/92 119/88  Pulse: 87 85 74 71  Resp: 18 17 12 12   Temp:      TempSrc:      SpO2: 92% 94% 94% 91%  Weight:  Height:       General: slender man in no distress at this time, having just received IV morphine. Eyes: PERRL, lids and conjunctivae normal ENMT: Mucous membranes are moist.   Neck: normal, supple, no masses, no thyromegaly Respiratory: clear to auscultation bilaterally with decreased breathsounds upper left chest, no wheezing, no crackles. Normal respiratory effort. No accessory muscle use.  Cardiovascular: Regular rate and rhythm, no murmurs / rubs / gallops. No extremity edema. 2+ pedal pulses. No carotid bruits.  Abdomen: no tenderness, no masses palpated. No hepatosplenomegaly. Bowel sounds positive.  Musculoskeletal: no clubbing / cyanosis. No joint deformity upper and lower extremities. Good ROM, no contractures. Normal muscle tone.  Skin: no rashes, lesions, ulcers. No induration Neurologic: CN 2-12 grossly intact.  Sensation intact, DTR normal. Strength 5/5 in all 4.  Psychiatric: Normal judgment and insight. Alert and oriented x 3. Normal mood.     Labs on Admission: I have personally reviewed following labs and imaging studies  CBC: Recent Labs  Lab 09/02/20 1335  WBC 4.8  NEUTROABS 2.3  HGB 13.8  HCT 41.3  MCV 99.3  PLT 812   Basic Metabolic Panel: Recent Labs  Lab 09/02/20 1335  NA 137  K 3.6  CL 102  CO2 26  GLUCOSE 83  BUN 16  CREATININE 0.99  CALCIUM 8.6*   GFR: Estimated Creatinine Clearance: 75.4 mL/min (by C-G formula based on SCr of 0.99 mg/dL). Liver Function Tests: Recent Labs  Lab 09/02/20 1335  AST 30  ALT 21  ALKPHOS 36*  BILITOT 0.5  PROT 7.7  ALBUMIN 3.6   No results for input(s): LIPASE, AMYLASE in the last 168 hours. No results for input(s): AMMONIA in the last 168 hours. Coagulation Profile: No results for input(s): INR, PROTIME in the last 168 hours. Cardiac Enzymes: No results for input(s): CKTOTAL, CKMB, CKMBINDEX, TROPONINI in the last 168 hours. BNP (last 3 results) No results for input(s): PROBNP in the last 8760 hours. HbA1C: No results for input(s): HGBA1C in the last 72 hours. CBG: No results for input(s): GLUCAP in the last 168 hours. Lipid Profile: No results for input(s): CHOL, HDL, LDLCALC, TRIG, CHOLHDL, LDLDIRECT in the last 72 hours. Thyroid Function Tests: No results for input(s): TSH, T4TOTAL, FREET4, T3FREE, THYROIDAB in the last 72 hours. Anemia Panel: No results for input(s): VITAMINB12, FOLATE, FERRITIN, TIBC, IRON, RETICCTPCT in the last 72 hours. Urine analysis:    Component Value Date/Time   COLORURINE YELLOW 11/09/2018 2030   [place] 11/09/2018 2030   LABSPEC 1.026 11/09/2018 2030   [place] 5.0 11/09/2018 2030   GLUCOSEU NEGATIVE 11/09/2018 2030   [place] NEGATIVE 11/09/2018 2030   [place] NEGATIVE 11/09/2018 2030   [place] 11/09/2018 2030   PROTEINUR NEGATIVE 11/09/2018 2030    UROBILINOGEN 1.0 12/17/2014 0040   NITRITE NEGATIVE 11/09/2018 2030   LEUKOCYTESUR NEGATIVE 11/09/2018 2030    Radiological Exams on Admission: MR BRAIN WO CONTRAST  Result Date: 09/02/2020 CLINICAL DATA:  Left arm numbness. EXAM: MRI HEAD WITHOUT CONTRAST TECHNIQUE: Multiplanar, multiecho pulse sequences of the brain and surrounding structures were obtained without intravenous contrast. COMPARISON:  Head CT 12/17/2014 FINDINGS: Brain: There is no evidence of an acute infarct, intracranial hemorrhage, mass, midline shift, or extra-axial fluid collection. Small T2 hyperintensities in the cerebral white matter bilaterally are nonspecific but compatible with mild chronic small vessel ischemic disease. The ventricles and sulci are within normal limits for age. Vascular: Major intracranial vascular flow voids are preserved. Skull and upper cervical  spine: Unremarkable bone marrow signal. Sinuses/Orbits: Unremarkable orbits. Paranasal sinuses and mastoid air cells are clear. Other: None. IMPRESSION: 1. No acute intracranial abnormality. 2. Mild chronic small vessel ischemic disease. Electronically Signed   By: Logan Bores M.D.   On: 09/02/2020 14:30   MR Cervical Spine Wo Contrast  Result Date: 09/02/2020 CLINICAL DATA:  Left arm numbness. EXAM: MRI CERVICAL SPINE WITHOUT CONTRAST TECHNIQUE: Multiplanar, multisequence MR imaging of the cervical spine was performed. No intravenous contrast was administered. COMPARISON:  Chest CTA 08/04/2020 FINDINGS: The study is motion degraded including severe motion on axial T1 and T2 gradient echo sequences. Alignment: Trace retrolisthesis of C3 on C4. Straightening of the normal cervical lordosis. Vertebrae: No fracture or suspicious marrow lesion. Degenerative endplate changes at O2-4 including mild degenerative edema associated with moderate disc space narrowing. Cord: Abnormal T2 hyperintensity in the cord bilaterally at C3-4. Posterior Fossa, vertebral arteries,  paraspinal tissues: Partial imaging of known left apical lung mass which invades into the soft tissues superior and posterior to the lung apex including the paraspinal soft tissues at T1-2. unremarkable included posterior fossa. Disc levels: C2-3: Disc bulging and right uncovertebral spurring result in mild right neural foraminal stenosis without significant spinal stenosis. C3-4: Disc bulging and uncovertebral spurring result in severe spinal stenosis with severe cord flattening and severe bilateral neural foraminal stenosis. C4-5: Disc bulging, uncovertebral spurring, and mild facet arthrosis result in moderate right neural foraminal stenosis without significant spinal stenosis. C5-6: Mild disc bulging and left facet arthrosis without stenosis. C6-7: Negative. C7-T1: Negative. IMPRESSION: 1. Motion degraded examination. 2. Partial imaging of known invasive left apical lung mass which likely involves the left brachial plexus. 3. Cervical disc degeneration greatest at C3-4 where there is severe spinal stenosis and cord signal abnormality suggesting spondylotic myelopathy. 4. Severe bilateral neural foraminal stenosis at C3-4. Electronically Signed   By: Logan Bores M.D.   On: 09/02/2020 14:46    EKG: Independently reviewed. SR w/o acute changes  Assessment/Plan Active Problems:   Pancoast syndrome, left ([facility])   Chronic back pain   Hypercholesteremia   Hypothyroidism    Pancoast syndrome - patient with 5 cm mass apex left lung, almost certainly lung cancer with invasion of the left brachial plexus. Plan Smoking cessation - nicoderm patch  Transfer to [facility] for oncology consultation for diagnostic workup and tx plan  MS 4 mg IV q 4 for pain  IV hydration  2. Hypothyroidism - patient has not been taking medication. Plan   resume prior dose of levothyroxine  3. HLD - continue home meds  DVT prophylaxis: heparin SQ  Code Status: full code  Family Communication: spoke with Derrill Memo, niece, contact  of choice: [phone]. Explained dx and plan.  Disposition Plan: TBD  Consults called: Oncology - EDP spoke with Dr. Benay Spice - team will see patient in AM ( Admission status: inpatient    Adella Hare MD [facility] Pager [phone]  If 7PM-7AM, please contact night-coverage [email or web address] Password [facility]  09/02/2020, 3:51 PM

## 2020-09-03 ENCOUNTER — Encounter (HOSPITAL_COMMUNITY): Payer: Self-pay | Admitting: Internal Medicine

## 2020-09-03 DIAGNOSIS — M9961 Osseous and subluxation stenosis of intervertebral foramina of cervical region: Secondary | ICD-10-CM

## 2020-09-03 DIAGNOSIS — R918 Other nonspecific abnormal finding of lung field: Secondary | ICD-10-CM

## 2020-09-03 LAB — BASIC METABOLIC PANEL
Anion gap: 7 (ref 5–15)
BUN: 15 mg/dL (ref 8–23)
CO2: 26 mmol/L (ref 22–32)
Calcium: 7.9 mg/dL — ABNORMAL LOW (ref 8.9–10.3)
Chloride: 102 mmol/L (ref 98–111)
Creatinine, Ser: 0.77 mg/dL (ref 0.61–1.24)
GFR, Estimated: >60 mL/min (ref >60)
Glucose, Bld: 91 mg/dL (ref 70–99)
Potassium: 3.7 mmol/L (ref 3.5–5.1)
Sodium: 135 mmol/L (ref 135–145)

## 2020-09-03 LAB — TSH: TSH: 96.04 u[IU]/mL — ABNORMAL HIGH (ref 0.350–4.500)

## 2020-09-03 LAB — SARS CORONAVIRUS 2 (TAT 6-24 HRS): SARS Coronavirus 2: NEGATIVE

## 2020-09-03 LAB — T4, FREE: Free T4: <0.25 ng/dL — ABNORMAL LOW (ref 0.61–1.12)

## 2020-09-03 MED ORDER — MORPHINE SULFATE (PF) 2 MG/ML IV SOLN
2.0000 mg | INTRAVENOUS | Status: DC | PRN
  Administered 2020-09-04 – 2020-09-05 (×3): 2 mg via INTRAVENOUS
  Filled 2020-09-03 (×3): qty 1

## 2020-09-03 MED ORDER — IOHEXOL 9 MG/ML PO SOLN
ORAL | Status: AC
  Filled 2020-09-03: qty 1000

## 2020-09-03 MED ORDER — IOHEXOL 9 MG/ML PO SOLN
500.0000 mL | ORAL | Status: AC
  Administered 2020-09-03: 500 mL via ORAL

## 2020-09-03 MED ORDER — OXYCODONE HCL 5 MG PO TABS
10.0000 mg | ORAL_TABLET | Freq: Four times a day (QID) | ORAL | Status: DC
  Administered 2020-09-03 – 2020-09-07 (×14): 10 mg via ORAL
  Filled 2020-09-03 (×15): qty 2

## 2020-09-03 NOTE — Progress Notes (Signed)
Patient c/o nausea. Patient refused to take any meds for nausea. He wants to wait and see  how he feels

## 2020-09-03 NOTE — Plan of Care (Signed)

## 2020-09-03 NOTE — Consult Note (Signed)
Reason for the request:   Lung mass  HPI: I was asked by Dr. Manuella Colon to evaluate Miguel Colon for evaluation of new lung mass.  He is a 65 year old man without any significant comorbid conditions and heavy tobacco use presented with left arm pain and paresthesia associated with left chest wall.  He underwent CT scan of the chest on August 04, 2020 for similar complaints and found to have 4.8 x 5.5 x 4.0 cm mass of the left lung apex compatible with a primary lung neoplasm.  11 mm left lung nodules were noted.  Is a 2.3 cm calcified nodule in the right upper lobe of the lung.  Based on these findings he was transferred from [facility] for work-up and evaluation.  Clinically, he reports his pain feels better at this time with pain medication but does have some weakness in his arm.  MRI of the brain did not show any evidence of metastatic disease on September 02, 2020.  MRI of the cervical spine showed a severe spinal stenosis and cord signal abnormality suggesting spondylitic myelopathy.  He denies any other complaints at this time.  He denies any chest pain, shortness of breath or difficulty breathing.   He does not report any headaches, blurry vision, syncope or seizures. Does not report any fevers, chills or sweats.  Does not report any cough, wheezing or hemoptysis.  Does not report any chest pain, palpitation, orthopnea or leg edema.  Does not report any nausea, vomiting or abdominal pain.  Does not report any constipation or diarrhea.  Does not report any skeletal complaints.    Does not report frequency, urgency or hematuria.  Does not report any skin rashes or lesions. Does not report any heat or cold intolerance.  Does not report any lymphadenopathy or petechiae.  Does not report any anxiety or depression.  Remaining review of systems is negative.     Past Medical History:  Diagnosis Date   Asthma    Back pain    Migraines    Thyroid disease   :   Past Surgical History:   Procedure Laterality Date   THYROIDECTOMY    :   Current Facility-Administered Medications:    acetaminophen (TYLENOL) tablet 650 mg, 650 mg, Oral, Q6H PRN **OR** acetaminophen (TYLENOL) suppository 650 mg, 650 mg, Rectal, Q6H PRN, Norins, Heinz Knuckles, MD   ALPRAZolam Duanne Moron) tablet 0.25 mg, 0.25 mg, Oral, TID PRN, Norins, Heinz Knuckles, MD   atorvastatin (LIPITOR) tablet 20 mg, 20 mg, Oral, q1800, Norins, Heinz Knuckles, MD, 20 mg at 09/02/20 1837   heparin injection 5,000 Units, 5,000 Units, Subcutaneous, Q8H, Norins, Heinz Knuckles, MD, 5,000 Units at 09/03/20 1358   iohexol (OMNIPAQUE) 9 MG/ML oral solution 500 mL, 500 mL, Oral, Q1H, Jennye Boroughs, MD   iohexol (OMNIPAQUE) 9 MG/ML oral solution, , , ,    levothyroxine (SYNTHROID) tablet 200 mcg, 200 mcg, Oral, QAC breakfast, Norins, Heinz Knuckles, MD, 200 mcg at 09/03/20 5631   morphine 2 MG/ML injection 2 mg, 2 mg, Intravenous, Q4H PRN, Miguel Colon, Pratik D, DO   nicotine (NICODERM CQ - dosed in mg/24 hours) patch 14 mg, 14 mg, Transdermal, Daily, Norins, Heinz Knuckles, MD, 14 mg at 09/03/20 4970   oxyCODONE (Oxy IR/ROXICODONE) immediate release tablet 10 mg, 10 mg, Oral, Q6H, Shah, Pratik D, DO, 10 mg at 09/03/20 0943   senna (SENOKOT) tablet 8.6 mg, 1 tablet, Oral, BID, Norins, Heinz Knuckles, MD   traZODone (DESYREL) tablet 25 mg,  25 mg, Oral, QHS PRN, Norins, Heinz Knuckles, MD, 25 mg at 09/02/20 2247:  No Known Allergies:   Family History  Problem Relation Age of Onset   Hypertension Mother    Hypertension Father    Colon cancer Father 34  :   Social History   Socioeconomic History   Marital status: Legally Separated    Spouse name: Not on file   Number of children: Not on file   Years of education: Not on file   Highest education level: Not on file  Occupational History   Not on file  Tobacco Use   Smoking status: Every Day    Packs/day: 0.50    Pack years: 0.00    Types: Cigarettes   Smokeless tobacco: Never   Tobacco comments:    down to 6  cigarettes a day   Vaping Use   Vaping Use: Never used  Substance and Sexual Activity   Alcohol use: No   Drug use: No   Sexual activity: Not on file  Other Topics Concern   Not on file  Social History Narrative   Not on file   Social Determinants of Health   Financial Resource Strain: Not on file  Food Insecurity: Not on file  Transportation Needs: Not on file  Physical Activity: Not on file  Stress: Not on file  Social Connections: Not on file  Intimate Partner Violence: Not on file  :  Pertinent items are noted in HPI.  Exam: Blood pressure 109/85, pulse (!) 52, temperature 98 F (36.7 C), temperature source Oral, resp. rate 18, height 5\' 9"  (1.753 m), weight 160 lb (72.6 kg), SpO2 99 %. ECOG 1 General appearance: alert and cooperative appeared without distress. Head: atraumatic without any abnormalities. Eyes: conjunctivae/corneas clear. PERRL.  Sclera anicteric. Throat: lips, mucosa, and tongue normal; without oral thrush or ulcers. Resp: clear to auscultation bilaterally without rhonchi, wheezes or dullness to percussion. Cardio: regular rate and rhythm, S1, S2 normal, no murmur, click, rub or gallop GI: soft, non-tender; bowel sounds normal; no masses,  no organomegaly Skin: Skin color, texture, turgor normal. No rashes or lesions Lymph nodes: Cervical, supraclavicular, and axillary nodes normal. Neurologic: Gross weakness noted Musculoskeletal: No joint deformity or effusion.   Recent Labs    09/02/20 1335  WBC 4.8  HGB 13.8  HCT 41.3  PLT 253    Recent Labs    09/02/20 1335 09/03/20 0332  NA 137 135  K 3.6 3.7  CL 102 102  CO2 26 26  GLUCOSE 83 91  BUN 16 15  CREATININE 0.99 0.77  CALCIUM 8.6* 7.9*       MR BRAIN WO CONTRAST  Result Date: 09/02/2020 CLINICAL DATA:  Left arm numbness. EXAM: MRI HEAD WITHOUT CONTRAST TECHNIQUE: Multiplanar, multiecho pulse sequences of the brain and surrounding structures were obtained without intravenous  contrast. COMPARISON:  Head CT 12/17/2014 FINDINGS: Brain: There is no evidence of an acute infarct, intracranial hemorrhage, mass, midline shift, or extra-axial fluid collection. Small T2 hyperintensities in the cerebral white matter bilaterally are nonspecific but compatible with mild chronic small vessel ischemic disease. The ventricles and sulci are within normal limits for age. Vascular: Major intracranial vascular flow voids are preserved. Skull and upper cervical spine: Unremarkable bone marrow signal. Sinuses/Orbits: Unremarkable orbits. Paranasal sinuses and mastoid air cells are clear. Other: None. IMPRESSION: 1. No acute intracranial abnormality. 2. Mild chronic small vessel ischemic disease. Electronically Signed   By: Miguel Colon M.D.   On: 09/02/2020  14:30   MR Cervical Spine Wo Contrast  Result Date: 09/02/2020 CLINICAL DATA:  Left arm numbness. EXAM: MRI CERVICAL SPINE WITHOUT CONTRAST TECHNIQUE: Multiplanar, multisequence MR imaging of the cervical spine was performed. No intravenous contrast was administered. COMPARISON:  Chest CTA 08/04/2020 FINDINGS: The study is motion degraded including severe motion on axial T1 and T2 gradient echo sequences. Alignment: Trace retrolisthesis of C3 on C4. Straightening of the normal cervical lordosis. Vertebrae: No fracture or suspicious marrow lesion. Degenerative endplate changes at Z6-1 including mild degenerative edema associated with moderate disc space narrowing. Cord: Abnormal T2 hyperintensity in the cord bilaterally at C3-4. Posterior Fossa, vertebral arteries, paraspinal tissues: Partial imaging of known left apical lung mass which invades into the soft tissues superior and posterior to the lung apex including the paraspinal soft tissues at T1-2. unremarkable included posterior fossa. Disc levels: C2-3: Disc bulging and right uncovertebral spurring result in mild right neural foraminal stenosis without significant spinal stenosis. C3-4: Disc  bulging and uncovertebral spurring result in severe spinal stenosis with severe cord flattening and severe bilateral neural foraminal stenosis. C4-5: Disc bulging, uncovertebral spurring, and mild facet arthrosis result in moderate right neural foraminal stenosis without significant spinal stenosis. C5-6: Mild disc bulging and left facet arthrosis without stenosis. C6-7: Negative. C7-T1: Negative. IMPRESSION: 1. Motion degraded examination. 2. Partial imaging of known invasive left apical lung mass which likely involves the left brachial plexus. 3. Cervical disc degeneration greatest at C3-4 where there is severe spinal stenosis and cord signal abnormality suggesting spondylotic myelopathy. 4. Severe bilateral neural foraminal stenosis at C3-4. Electronically Signed   By: Miguel Colon M.D.   On: 09/02/2020 14:46   CT ANGIO CHEST AORTA W/CM & OR WO/CM  Result Date: 08/04/2020 CLINICAL DATA:  Left chest pain, evaluate for dissection EXAM: CT ANGIOGRAPHY CHEST WITH CONTRAST TECHNIQUE: Multidetector CT imaging of the chest was performed using the standard protocol during bolus administration of intravenous contrast. Multiplanar CT image reconstructions and MIPs were obtained to evaluate the vascular anatomy. CONTRAST:  15mL OMNIPAQUE IOHEXOL 350 MG/ML SOLN COMPARISON:  12/28/2018 FINDINGS: Cardiovascular: No evidence thoracic aortic aneurysm or dissection. Very mild atherosclerotic calcifications of the aortic arch. Study is not tailored for evaluation of the pulmonary arteries. There is no evidence of central pulmonary embolism. The heart is normal in size.  No pericardial effusion. Mild coronary atherosclerosis of the LAD. Mediastinum/Nodes: No suspicious mediastinal lymphadenopathy. Visualized thyroid is unremarkable. Lungs/Pleura: 4.8 x 5.5 x 4.0 cm mass at the left lung apex (series 4/image 14), new, compatible with primary bronchogenic neoplasm/Pancoast tumor. 11 mm noncalcified left lower lobe nodule (series  7/image 137), unchanged, benign. Additional 2.3 cm calcified nodule in the right upper lobe (series 7/image 31) and partially obscured nodular scarring in the left upper lobe (series 7/image 31). Moderate centrilobular and paraseptal emphysematous changes, upper lung predominant. No focal consolidation. Mild scarring/atelectasis in the right lower lobe. No pleural effusion or pneumothorax. Upper Abdomen: Visualized upper abdomen is grossly unremarkable. Musculoskeletal: Mild degenerative changes of the lower thoracic spine. Review of the MIP images confirms the above findings. IMPRESSION: No evidence of thoracic aortic aneurysm or dissection. 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 No findings specific for metastatic disease. Aortic Atherosclerosis (ICD10-I70.0) and Emphysema (ICD10-J43.9). Electronically Signed   By: Julian Hy M.D.   On: 08/04/2020 19:01    Assessment and Plan:   65 year old with:  1.  Left lung mass measuring 4.8 x 5.5 x 4.0  cm in the apex of the lung compatible with Pancoast tumor without any evidence of metastatic disease in the chest.  MRI of the brain also did not show any evidence of metastasis.  Management options and differential diagnosis was discussed at this time.  Obtaining tissue diagnosis is paramount at this time to identify the primary histology which will dictate his treatment choices.  Obtaining images of the abdomen and pelvis to rule out metastatic disease is also important at this time.  Overall, his malignancy appears to be localized and should be amendable to treatment with definitive radiation therapy concomitantly with chemotherapy once the pathology is confirmed to be non-small cell lung cancer.  His performance status is adequate and aggressive measures are warranted at this time.  2.  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  Neurosurgical evaluation is pending his lung  cancer evaluation at this time.  If no evidence of metastatic disease is detected as expected, aggressive measures to treat to cervical spinal stenosis is recommended at this time.  He has lung cancer appears to be treatable and his performance status is adequate to undergo neurosurgical intervention if felt necessary.  3.  Disposition: After obtaining tissue biopsy and imaging of the abdomen.  I have no objections to discharge pending neurosurgical evaluation as well.  We will arrange follow-up with [facility] upon his discharge.  We will continue to follow during his hospitalization.  80  minutes were dedicated to this visit. 50% of the he time was spent face-to-face on imaging studies, discussing treatment options, discussing differential diagnosis, discussing other specialists the case with and answering questions regarding future plan.

## 2020-09-03 NOTE — Progress Notes (Signed)
PROGRESS NOTE    Miguel Colon  PQD:[id number] DOB: [birth year] DOA: 09/02/2020 PCP: Miguel Richard, MD   Brief Narrative:   Miguel Colon is a 65 y.o. male with medical history significant of Asthma, hypothyroidism who was diagnosed with a 5cm tumor apex of left lung June 6, '22. He has not yet had follow up. He has been having increasing numbness in the left UE and severe pain in the left chest and left UE.  He appears to have Pancoast syndrome and plan is to transfer to [facility] for oncology consultation.  Assessment & Plan:   Active Problems:   Chronic back pain   Hypercholesteremia   Hypothyroidism   Pancoast syndrome, left (HCC)   Pancoast syndrome-left-sided -Noted to have 5 cm mass to the apex of the left lung -Continue pain management with now oral oxycodone and IV morphine as ordered for breakthrough pain control -Monitor blood pressures carefully -Encouraged smoking cessation  Hypothyroidism -Patient has apparently been noncompliant with his medication -Resume prior levothyroxine -Check TSH and free T4  Dyslipidemia -Continue statin  History of tobacco abuse -Nicotine patch and counseling for cessation   DVT prophylaxis: Heparin Code Status: Full Family Communication: None at bedside, patient will call Disposition Plan:  Status is: Inpatient  Remains inpatient appropriate because:Ongoing diagnostic testing needed not appropriate for outpatient work up, IV treatments appropriate due to intensity of illness or inability to take PO, and Inpatient level of care appropriate due to severity of illness  Dispo: The patient is from: Home              Anticipated d/c is to: Home              Patient currently is not medically stable to d/c.   Difficult to place patient No   Consultants:  Oncology Dr. Benay Spice  Procedures:  See below  Antimicrobials:  None   Subjective: Patient seen and evaluated today with no new acute complaints or concerns. No acute  concerns or events noted overnight. He states his pain is currently controlled.  Objective: Vitals:   09/03/20 0330 09/03/20 0500 09/03/20 0516 09/03/20 0730  BP: 106/80 115/89 115/89 108/82  Pulse: (!) 56 (!) 56 (!) 54 61  Resp: 18  18 15   Temp:    98 F (36.7 C)  TempSrc:    Oral  SpO2:  98% 95% 97%  Weight:      Height:       No intake or output data in the 24 hours ending 09/03/20 0903 Filed Weights   09/02/20 1218  Weight: 72.6 kg    Examination:  General exam: Appears calm and comfortable  Respiratory system: Clear to auscultation. Respiratory effort normal. Cardiovascular system: S1 & S2 heard, RRR.  Gastrointestinal system: Abdomen is soft Central nervous system: Alert and awake Extremities: No edema Skin: No significant lesions noted Psychiatry: Flat affect.    Data Reviewed: I have personally reviewed following labs and imaging studies  CBC: Recent Labs  Lab 09/02/20 1335  WBC 4.8  NEUTROABS 2.3  HGB 13.8  HCT 41.3  MCV 99.3  PLT 940   Basic Metabolic Panel: Recent Labs  Lab 09/02/20 1335 09/03/20 0332  NA 137 135  K 3.6 3.7  CL 102 102  CO2 26 26  GLUCOSE 83 91  BUN 16 15  CREATININE 0.99 0.77  CALCIUM 8.6* 7.9*   GFR: Estimated Creatinine Clearance: 93.3 mL/min (by C-G formula based on SCr of 0.77 mg/dL). Liver Function Tests: Recent  Labs  Lab 09/02/20 1335  AST 30  ALT 21  ALKPHOS 36*  BILITOT 0.5  PROT 7.7  ALBUMIN 3.6   No results for input(s): LIPASE, AMYLASE in the last 168 hours. No results for input(s): AMMONIA in the last 168 hours. Coagulation Profile: No results for input(s): INR, PROTIME in the last 168 hours. Cardiac Enzymes: No results for input(s): CKTOTAL, CKMB, CKMBINDEX, TROPONINI in the last 168 hours. BNP (last 3 results) No results for input(s): PROBNP in the last 8760 hours. HbA1C: No results for input(s): HGBA1C in the last 72 hours. CBG: No results for input(s): GLUCAP in the last 168 hours. Lipid  Profile: No results for input(s): CHOL, HDL, LDLCALC, TRIG, CHOLHDL, LDLDIRECT in the last 72 hours. Thyroid Function Tests: No results for input(s): TSH, T4TOTAL, FREET4, T3FREE, THYROIDAB in the last 72 hours. Anemia Panel: No results for input(s): VITAMINB12, FOLATE, FERRITIN, TIBC, IRON, RETICCTPCT in the last 72 hours. Sepsis Labs: No results for input(s): PROCALCITON, LATICACIDVEN in the last 168 hours.  Recent Results (from the past 240 hour(s))  SARS CORONAVIRUS 2 (TAT 6-24 HRS) Nasopharyngeal Nasopharyngeal Swab     Status: None   Collection Time: 09/02/20  3:03 PM   Specimen: Nasopharyngeal Swab  Result Value Ref Range Status   SARS Coronavirus 2 NEGATIVE NEGATIVE Final    Comment: (NOTE) SARS-CoV-2 target nucleic acids are NOT DETECTED.  The SARS-CoV-2 RNA is generally detectable in upper and lower respiratory specimens during the acute phase of infection. Negative results do not preclude SARS-CoV-2 infection, do not rule out co-infections with other pathogens, and should not be used as the sole basis for treatment or other patient management decisions. Negative results must be combined with clinical observations, patient history, and epidemiological information. The expected result is Negative.  Fact Sheet for Patients: SugarRoll.be  Fact Sheet for Healthcare Providers: https://www.woods-mathews.com/  This test is not yet approved or cleared by the [place] FDA and  has been authorized for detection and/or diagnosis of SARS-CoV-2 by FDA under an Emergency Use Authorization (EUA). This EUA will remain  in effect (meaning this test can be used) for the duration of the COVID-19 declaration under Se ction 564(b)(1) of the Act, 21 U.S.C. section 360bbb-3(b)(1), unless the authorization is terminated or revoked sooner.  Performed at [facility], [place] [address] [place], [place] [address]          Radiology  Studies: MR BRAIN WO CONTRAST  Result Date: 09/02/2020 CLINICAL DATA:  Left arm numbness. EXAM: MRI HEAD WITHOUT CONTRAST TECHNIQUE: Multiplanar, multiecho pulse sequences of the brain and surrounding structures were obtained without intravenous contrast. COMPARISON:  Head CT 12/17/2014 FINDINGS: Brain: There is no evidence of an acute infarct, intracranial hemorrhage, mass, midline shift, or extra-axial fluid collection. Small T2 hyperintensities in the cerebral white matter bilaterally are nonspecific but compatible with mild chronic small vessel ischemic disease. The ventricles and sulci are within normal limits for age. Vascular: Major intracranial vascular flow voids are preserved. Skull and upper cervical spine: Unremarkable bone marrow signal. Sinuses/Orbits: Unremarkable orbits. Paranasal sinuses and mastoid air cells are clear. Other: None. IMPRESSION: 1. No acute intracranial abnormality. 2. Mild chronic small vessel ischemic disease. Electronically Signed   By: Logan Bores M.D.   On: 09/02/2020 14:30   MR Cervical Spine Wo Contrast  Result Date: 09/02/2020 CLINICAL DATA:  Left arm numbness. EXAM: MRI CERVICAL SPINE WITHOUT CONTRAST TECHNIQUE: Multiplanar, multisequence MR imaging of the cervical spine was performed. No intravenous contrast  was administered. COMPARISON:  Chest CTA 08/04/2020 FINDINGS: The study is motion degraded including severe motion on axial T1 and T2 gradient echo sequences. Alignment: Trace retrolisthesis of C3 on C4. Straightening of the normal cervical lordosis. Vertebrae: No fracture or suspicious marrow lesion. Degenerative endplate changes at Q7-6 including mild degenerative edema associated with moderate disc space narrowing. Cord: Abnormal T2 hyperintensity in the cord bilaterally at C3-4. Posterior Fossa, vertebral arteries, paraspinal tissues: Partial imaging of known left apical lung mass which invades into the soft tissues superior and posterior to the lung apex  including the paraspinal soft tissues at T1-2. unremarkable included posterior fossa. Disc levels: C2-3: Disc bulging and right uncovertebral spurring result in mild right neural foraminal stenosis without significant spinal stenosis. C3-4: Disc bulging and uncovertebral spurring result in severe spinal stenosis with severe cord flattening and severe bilateral neural foraminal stenosis. C4-5: Disc bulging, uncovertebral spurring, and mild facet arthrosis result in moderate right neural foraminal stenosis without significant spinal stenosis. C5-6: Mild disc bulging and left facet arthrosis without stenosis. C6-7: Negative. C7-T1: Negative. IMPRESSION: 1. Motion degraded examination. 2. Partial imaging of known invasive left apical lung mass which likely involves the left brachial plexus. 3. Cervical disc degeneration greatest at C3-4 where there is severe spinal stenosis and cord signal abnormality suggesting spondylotic myelopathy. 4. Severe bilateral neural foraminal stenosis at C3-4. Electronically Signed   By: Logan Bores M.D.   On: 09/02/2020 14:46        Scheduled Meds:  atorvastatin  20 mg Oral q1800   heparin  5,000 Units Subcutaneous Q8H   levothyroxine  200 mcg Oral QAC breakfast   nicotine  14 mg Transdermal Daily   oxyCODONE  10 mg Oral Q6H   senna  1 tablet Oral BID     LOS: 1 day    Time spent: 35 minutes    Brailyn Killion Darleen Crocker, DO [facility]  If 7PM-7AM, please contact night-coverage [email or web address] 09/03/2020, 9:03 AM

## 2020-09-04 ENCOUNTER — Inpatient Hospital Stay (HOSPITAL_COMMUNITY): Payer: Medicare Other

## 2020-09-04 LAB — BASIC METABOLIC PANEL
Anion gap: 6 (ref 5–15)
BUN: 13 mg/dL (ref 8–23)
CO2: 28 mmol/L (ref 22–32)
Calcium: 8.7 mg/dL — ABNORMAL LOW (ref 8.9–10.3)
Chloride: 102 mmol/L (ref 98–111)
Creatinine, Ser: 1.04 mg/dL (ref 0.61–1.24)
GFR, Estimated: >60 mL/min (ref >60)
Glucose, Bld: 83 mg/dL (ref 70–99)
Potassium: 4 mmol/L (ref 3.5–5.1)
Sodium: 136 mmol/L (ref 135–145)

## 2020-09-04 LAB — CBC
HCT: 37.9 % — ABNORMAL LOW (ref 39.0–52.0)
Hemoglobin: 12.6 g/dL — ABNORMAL LOW (ref 13.0–17.0)
MCH: 32.9 pg (ref 26.0–34.0)
MCHC: 33.2 g/dL (ref 30.0–36.0)
MCV: 99 fL (ref 80.0–100.0)
Platelets: 203 10*3/uL (ref 150–400)
RBC: 3.83 MIL/uL — ABNORMAL LOW (ref 4.22–5.81)
RDW: 15.6 % — ABNORMAL HIGH (ref 11.5–15.5)
WBC: 4.1 10*3/uL (ref 4.0–10.5)
nRBC: 0 % (ref 0.0–0.2)

## 2020-09-04 LAB — MAGNESIUM: Magnesium: 1.9 mg/dL (ref 1.7–2.4)

## 2020-09-04 MED ORDER — ONDANSETRON HCL 4 MG/2ML IJ SOLN
4.0000 mg | Freq: Four times a day (QID) | INTRAMUSCULAR | Status: DC | PRN
  Administered 2020-09-04: 4 mg via INTRAVENOUS
  Filled 2020-09-04: qty 2

## 2020-09-04 MED ORDER — SODIUM CHLORIDE 0.9 % IV SOLN
12.5000 mg | Freq: Four times a day (QID) | INTRAVENOUS | Status: DC | PRN
  Administered 2020-09-04: 12.5 mg via INTRAVENOUS
  Filled 2020-09-04: qty 12.5
  Filled 2020-09-04: qty 0.5

## 2020-09-04 MED ORDER — IOHEXOL 300 MG/ML  SOLN
100.0000 mL | Freq: Once | INTRAMUSCULAR | Status: AC | PRN
  Administered 2020-09-04: 100 mL via INTRAVENOUS

## 2020-09-04 NOTE — Progress Notes (Signed)
Patient ID: Miguel Colon, male   DOB: 1955/09/15, 65 y.o.   MRN: [id number] Aware of request for lung biopsy on patient.  Case has been reviewed by Dr. Laurence Ferrari.  He recommends review of additional imaging for other potential safer sites to biopsy.  Will await follow-up CT abdomen pelvis before scheduling bx.

## 2020-09-04 NOTE — Progress Notes (Addendum)
Progress Note    Miguel Colon  TDD:[id number] DOB: [birth year]  DOA: 09/02/2020 PCP: [name], MD      Brief Narrative:    Medical records reviewed and are as summarized below:  Miguel Colon is a 65 y.o. male  with medical history significant of asthma, hypothyroidism, hyperlipidemia who was diagnosed with left lung apex on June 6, '22. He has not yet had follow up. He has been having increasing numbness in the left UE and severe pain in the left chest and left UE.      Assessment/Plan:   Active Problems:   Chronic back pain   Hypercholesteremia   Hypothyroidism   Pancoast syndrome, left (HCC)    Body mass index is 23.63 kg/m.   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  CT lung biopsy has been ordered as well.  No evidence of brain metastasis on MRI brain without contrast.  Nausea and vomiting: He is scheduled for CT abdomen pelvis this morning.  However, he requested that CT abdomen and pelvis be postponed to a later time this afternoon because of significant nausea.  Treat with antiemetics.  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 Christella Noa, neurosurgeon on 09/03/2020.  He said there was no need for emergent surgery at this time.  Hypothyroidism: TSH is 96.040 and FT4 < 0.25.  Continue Synthroid.  Medical nonadherence: The importance of medical adherence was emphasized.  Tobacco use disorder: Counseled to quit smoking cigarettes.   Diet Order             Diet NPO time specified Except for: Sips with Meds  Diet effective now                      Consultants: Oncologist Neurosurgeon  Procedures: None    Medications:    atorvastatin  20 mg Oral q1800   heparin  5,000 Units Subcutaneous Q8H   levothyroxine  200 mcg Oral QAC breakfast    nicotine  14 mg Transdermal Daily   oxyCODONE  10 mg Oral Q6H   senna  1 tablet Oral BID   Continuous Infusions:   Anti-infectives (From admission, onward)    None              Family Communication/Anticipated D/C date and plan/Code Status   DVT prophylaxis: heparin injection 5,000 Units Start: 09/02/20 1600     Code Status: Full Code  Family Communication: None Disposition Plan:    Status is: Inpatient  Remains inpatient appropriate because:Inpatient level of care appropriate due to severity of illness  Dispo: The patient is from: Home              Anticipated d/c is to: Home              Patient currently is not medically stable to d/c.   Difficult to place patient No           Subjective:   C/o nausea.  He said he vomited yesterday.  He supposed to drink oral contrast for CT abdomen pelvis that he thinks is going to be difficult because of significant nausea.  Objective:    Vitals:   09/03/20 1809 09/03/20 1822 09/03/20 2030 09/04/20 0514  BP: 114/84  (!) 123/93 (!) 109/95  Pulse: (!) 51  (!) 58 68  Resp:  18  18 18   Temp:  97.7 F (36.5 C) 98 F (36.7 C) 97.9 F (36.6 C)  TempSrc:  Oral Oral Oral  SpO2: 97%  99% 96%  Weight:      Height:       No data found.   Intake/Output Summary (Last 24 hours) at 09/04/2020 0851 Last data filed at 09/04/2020 0745 Gross per 24 hour  Intake 1020 ml  Output 850 ml  Net 170 ml   Filed Weights   09/02/20 1218  Weight: 72.6 kg    Exam:  GEN: NAD SKIN: No rash EYES: EOMI ENT: MMM CV: RRR PULM: CTA B ABD: soft, ND, NT, +BS CNS: AAO x 3, non focal EXT: No edema or tenderness        Data Reviewed:   I have personally reviewed following labs and imaging studies:  Labs: Labs show the following:   Basic Metabolic Panel: Recent Labs  Lab 09/02/20 1335 09/03/20 0332 09/04/20 0414  NA 137 135 136  K 3.6 3.7 4.0  CL 102 102 102  CO2 26 26 28   GLUCOSE 83 91 83  BUN 16 15 13    CREATININE 0.99 0.77 1.04  CALCIUM 8.6* 7.9* 8.7*  MG  --   --  1.9   GFR Estimated Creatinine Clearance: 71.8 mL/min (by C-G formula based on SCr of 1.04 mg/dL). Liver Function Tests: Recent Labs  Lab 09/02/20 1335  AST 30  ALT 21  ALKPHOS 36*  BILITOT 0.5  PROT 7.7  ALBUMIN 3.6   No results for input(s): LIPASE, AMYLASE in the last 168 hours. No results for input(s): AMMONIA in the last 168 hours. Coagulation profile No results for input(s): INR, PROTIME in the last 168 hours.  CBC: Recent Labs  Lab 09/02/20 1335 09/04/20 0414  WBC 4.8 4.1  NEUTROABS 2.3  --   HGB 13.8 12.6*  HCT 41.3 37.9*  MCV 99.3 99.0  PLT 253 203   Cardiac Enzymes: No results for input(s): CKTOTAL, CKMB, CKMBINDEX, TROPONINI in the last 168 hours. BNP (last 3 results) No results for input(s): PROBNP in the last 8760 hours. CBG: No results for input(s): GLUCAP in the last 168 hours. D-Dimer: No results for input(s): DDIMER in the last 72 hours. Hgb A1c: No results for input(s): HGBA1C in the last 72 hours. Lipid Profile: No results for input(s): CHOL, HDL, LDLCALC, TRIG, CHOLHDL, LDLDIRECT in the last 72 hours. Thyroid function studies: Recent Labs    09/03/20 0332  TSH 96.040*   Anemia work up: No results for input(s): VITAMINB12, FOLATE, FERRITIN, TIBC, IRON, RETICCTPCT in the last 72 hours. Sepsis Labs: Recent Labs  Lab 09/02/20 1335 09/04/20 0414  WBC 4.8 4.1    Microbiology Recent Results (from the past 240 hour(s))  SARS CORONAVIRUS 2 (TAT 6-24 HRS) Nasopharyngeal Nasopharyngeal Swab     Status: None   Collection Time: 09/02/20  3:03 PM   Specimen: Nasopharyngeal Swab  Result Value Ref Range Status   SARS Coronavirus 2 NEGATIVE NEGATIVE Final    Comment: (NOTE) SARS-CoV-2 target nucleic acids are NOT DETECTED.  The SARS-CoV-2 RNA is generally detectable in upper and lower respiratory specimens during the acute phase of infection. Negative results do not preclude  SARS-CoV-2 infection, do not rule out co-infections with other pathogens, and should not be used as the sole basis for treatment or other patient management decisions. Negative results must be combined with clinical observations, patient history, and epidemiological information. The expected result is Negative.  Fact  Sheet for Patients: SugarRoll.be  Fact Sheet for Healthcare Providers: https://www.woods-mathews.com/  This test is not yet approved or cleared by the [place] FDA and  has been authorized for detection and/or diagnosis of SARS-CoV-2 by FDA under an Emergency Use Authorization (EUA). This EUA will remain  in effect (meaning this test can be used) for the duration of the COVID-19 declaration under Se ction 564(b)(1) of the Act, 21 U.S.C. section 360bbb-3(b)(1), unless the authorization is terminated or revoked sooner.  Performed at [facility], [place] [address] [place], [place] [address]     Procedures and diagnostic studies:  MR BRAIN WO CONTRAST  Result Date: 09/02/2020 CLINICAL DATA:  Left arm numbness. EXAM: MRI HEAD WITHOUT CONTRAST TECHNIQUE: Multiplanar, multiecho pulse sequences of the brain and surrounding structures were obtained without intravenous contrast. COMPARISON:  Head CT 12/17/2014 FINDINGS: Brain: There is no evidence of an acute infarct, intracranial hemorrhage, mass, midline shift, or extra-axial fluid collection. Small T2 hyperintensities in the cerebral white matter bilaterally are nonspecific but compatible with mild chronic small vessel ischemic disease. The ventricles and sulci are within normal limits for age. Vascular: Major intracranial vascular flow voids are preserved. Skull and upper cervical spine: Unremarkable bone marrow signal. Sinuses/Orbits: Unremarkable orbits. Paranasal sinuses and mastoid air cells are clear. Other: None. IMPRESSION: 1. No acute intracranial abnormality. 2. Mild  chronic small vessel ischemic disease. Electronically Signed   By: Logan Bores M.D.   On: 09/02/2020 14:30   MR Cervical Spine Wo Contrast  Result Date: 09/02/2020 CLINICAL DATA:  Left arm numbness. EXAM: MRI CERVICAL SPINE WITHOUT CONTRAST TECHNIQUE: Multiplanar, multisequence MR imaging of the cervical spine was performed. No intravenous contrast was administered. COMPARISON:  Chest CTA 08/04/2020 FINDINGS: The study is motion degraded including severe motion on axial T1 and T2 gradient echo sequences. Alignment: Trace retrolisthesis of C3 on C4. Straightening of the normal cervical lordosis. Vertebrae: No fracture or suspicious marrow lesion. Degenerative endplate changes at H8-5 including mild degenerative edema associated with moderate disc space narrowing. Cord: Abnormal T2 hyperintensity in the cord bilaterally at C3-4. Posterior Fossa, vertebral arteries, paraspinal tissues: Partial imaging of known left apical lung mass which invades into the soft tissues superior and posterior to the lung apex including the paraspinal soft tissues at T1-2. unremarkable included posterior fossa. Disc levels: C2-3: Disc bulging and right uncovertebral spurring result in mild right neural foraminal stenosis without significant spinal stenosis. C3-4: Disc bulging and uncovertebral spurring result in severe spinal stenosis with severe cord flattening and severe bilateral neural foraminal stenosis. C4-5: Disc bulging, uncovertebral spurring, and mild facet arthrosis result in moderate right neural foraminal stenosis without significant spinal stenosis. C5-6: Mild disc bulging and left facet arthrosis without stenosis. C6-7: Negative. C7-T1: Negative. IMPRESSION: 1. Motion degraded examination. 2. Partial imaging of known invasive left apical lung mass which likely involves the left brachial plexus. 3. Cervical disc degeneration greatest at C3-4 where there is severe spinal stenosis and cord signal abnormality suggesting  spondylotic myelopathy. 4. Severe bilateral neural foraminal stenosis at C3-4. Electronically Signed   By: Logan Bores M.D.   On: 09/02/2020 14:46               LOS: 2 days   Maurice Fotheringham  [facility]   Pager on [email or web address]. If 7PM-7AM, please contact night-coverage at [email or web address]     09/04/2020, 8:51 AM

## 2020-09-04 NOTE — Progress Notes (Addendum)
At 0815 Pt was instructed to remain NPO and starting drinking the contrast. By 1415 he had only drank one of the bottles. I instructed him that he needed to drink the other one and he became argumentative, stating "I don't know how anyone can expect me to drink all this in 1 hour!". I reminded him that he had had the bottles for 6 hours, not 1. He put his hand up and said "we aren't going to discuss this anymore, I'll get it done". I told him to call me when he had finished the second bottle, so that I could call CT. He verbalized understanding.

## 2020-09-05 ENCOUNTER — Encounter (HOSPITAL_COMMUNITY): Payer: Self-pay | Admitting: Internal Medicine

## 2020-09-05 ENCOUNTER — Inpatient Hospital Stay (HOSPITAL_COMMUNITY): Payer: Medicare Other

## 2020-09-05 DIAGNOSIS — M79603 Pain in arm, unspecified: Secondary | ICD-10-CM

## 2020-09-05 DIAGNOSIS — M25519 Pain in unspecified shoulder: Secondary | ICD-10-CM

## 2020-09-05 DIAGNOSIS — C3412 Malignant neoplasm of upper lobe, left bronchus or lung: Principal | ICD-10-CM

## 2020-09-05 DIAGNOSIS — M4802 Spinal stenosis, cervical region: Secondary | ICD-10-CM

## 2020-09-05 MED ORDER — NALOXONE HCL 0.4 MG/ML IJ SOLN
INTRAMUSCULAR | Status: AC
  Filled 2020-09-05: qty 1

## 2020-09-05 MED ORDER — MIDAZOLAM HCL 2 MG/2ML IJ SOLN
INTRAMUSCULAR | Status: AC
  Filled 2020-09-05: qty 4

## 2020-09-05 MED ORDER — FENTANYL CITRATE (PF) 100 MCG/2ML IJ SOLN
INTRAMUSCULAR | Status: AC | PRN
  Administered 2020-09-05: 50 ug via INTRAVENOUS

## 2020-09-05 MED ORDER — HYDROCODONE-ACETAMINOPHEN 5-325 MG PO TABS
1.0000 | ORAL_TABLET | ORAL | Status: DC | PRN
  Administered 2020-09-07: 2 via ORAL
  Filled 2020-09-05: qty 2

## 2020-09-05 MED ORDER — MORPHINE SULFATE (PF) 4 MG/ML IV SOLN: 4.0000 mg | INTRAVENOUS | Status: DC | PRN

## 2020-09-05 MED ORDER — HEPARIN SODIUM (PORCINE) 5000 UNIT/ML IJ SOLN
5000.0000 [IU] | Freq: Three times a day (TID) | INTRAMUSCULAR | Status: DC
  Administered 2020-09-05 – 2020-09-09 (×11): 5000 [IU] via SUBCUTANEOUS
  Filled 2020-09-05 (×10): qty 1

## 2020-09-05 MED ORDER — FENTANYL CITRATE (PF) 100 MCG/2ML IJ SOLN
INTRAMUSCULAR | Status: AC
  Filled 2020-09-05: qty 2

## 2020-09-05 MED ORDER — LIDOCAINE HCL (PF) 1 % IJ SOLN
INTRAMUSCULAR | Status: AC | PRN
  Administered 2020-09-05: 10 mL via INTRADERMAL

## 2020-09-05 MED ORDER — FLUMAZENIL 0.5 MG/5ML IV SOLN
INTRAVENOUS | Status: AC
  Filled 2020-09-05: qty 5

## 2020-09-05 MED ORDER — MIDAZOLAM HCL 2 MG/2ML IJ SOLN
INTRAMUSCULAR | Status: AC | PRN
  Administered 2020-09-05: 1 mg via INTRAVENOUS

## 2020-09-05 NOTE — Procedures (Signed)
  Procedure: CT core biopsy LUL lung mass   EBL:   minimal Complications:  none immediate  See full dictation in [facility].  Dillard Cannon MD Main # [phone] Pager  [phone]

## 2020-09-05 NOTE — Progress Notes (Signed)
IP PROGRESS NOTE  Subjective:   Patient reports no major changes in his health.  He still has complaints of shoulder chest and arm pain on the left side.  Denies any neurological deficits at this time.  No shortness of breath or difficulty breathing noted.  Objective:  Vital signs in last 24 hours: Temp:  [97.7 F (36.5 C)-98.8 F (37.1 C)] 98.6 F (37 C) (07/08 0540) Pulse Rate:  [61-71] 67 (07/08 0540) Resp:  [16-18] 18 (07/08 0540) BP: (96-126)/(71-90) 126/90 (07/08 0540) SpO2:  [93 %-97 %] 96 % (07/08 0540) Weight change:  Last BM Date: 09/04/20  Intake/Output from previous day: 07/07 0701 - 07/08 0700 In: 770 [P.O.:720; IV Piggyback:50] Out: -  General: Alert, awake without distress. Head: Normocephalic atraumatic. Mouth: mucous membranes moist, pharynx normal without lesions Eyes: No scleral icterus.  Pupils are equal and round reactive to light. Resp: clear to auscultation bilaterally without rhonchi or wheezes or dullness to percussion. Cardio: regular rate and rhythm, S1, S2 normal, no murmur, click, rub or gallop GI: soft, non-tender; bowel sounds normal; no masses,  no organomegaly Musculoskeletal: No joint deformity or effusion. Neurological: Weakness in the left upper extremity with motor strength Skin: No rashes or lesions.  Lab Results: Recent Labs    09/02/20 1335 09/04/20 0414  WBC 4.8 4.1  HGB 13.8 12.6*  HCT 41.3 37.9*  PLT 253 203    BMET Recent Labs    09/03/20 0332 09/04/20 0414  NA 135 136  K 3.7 4.0  CL 102 102  CO2 26 28  GLUCOSE 91 83  BUN 15 13  CREATININE 0.77 1.04  CALCIUM 7.9* 8.7*    Studies/Results: CT ABDOMEN PELVIS W CONTRAST  Result Date: 09/04/2020 CLINICAL DATA:  Follow-up to lung cancer diagnosis. Evaluate for metastatic disease. EXAM: CT ABDOMEN AND PELVIS WITH CONTRAST TECHNIQUE: Multidetector CT imaging of the abdomen and pelvis was performed using the standard protocol following bolus administration of  intravenous contrast. CONTRAST:  154mL OMNIPAQUE IOHEXOL 300 MG/ML  SOLN COMPARISON:  CT abdomen and pelvis 11/09/2018 FINDINGS: Lower chest: Linear atelectasis or scarring in the right lung base. Circumscribed nodule in the left base measuring 1.1 cm diameter, likely metastatic. Hepatobiliary: No focal liver lesions identified. Gallbladder and bile ducts are normal. Pancreas: Unremarkable. No pancreatic ductal dilatation or surrounding inflammatory changes. Spleen: Normal in size without focal abnormality. Adrenals/Urinary Tract: No adrenal gland nodules. Kidneys are symmetrical. Subcentimeter cysts. No solid mass. No hydronephrosis or hydroureter. Bladder is unremarkable. Stomach/Bowel: Stomach, small bowel, and colon are not abnormally distended. The colon is diffusely stool-filled with prominent stool in the rectum. Likely constipation. No wall thickening or inflammatory changes. Appendix is not identified. Vascular/Lymphatic: Aortic atherosclerosis. No enlarged abdominal or pelvic lymph nodes. Reproductive: Prostate is unremarkable. Other: No free air or free fluid in the abdomen. Abdominal wall musculature appears intact. Musculoskeletal: Mild degenerative changes in the spine. No destructive or expansile bone lesions are appreciated. There a few scattered subcentimeter foci of sclerosis, likely representing bone islands. IMPRESSION: 1. Left lung base nodule, likely metastatic. 2. No metastatic disease demonstrated in the abdomen or pelvis. 3. Diffusely stool-filled colon with prominent stool filled rectum, likely constipation. Electronically Signed   By: Lucienne Capers M.D.   On: 09/04/2020 19:12    Medications: I have reviewed the patient's current medications.  Assessment/Plan:   65 year old with:    1.  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  His work-up is  currently pending a biopsy.    He is under consideration for lung biopsy before  proceeding with treatment.  Treatment choices were discussed with the patient today and I feel that chemotherapy with radiation is his best option once the pathological confirmation is complete.  Unless we are dealing with small cell cancer, therapy can commence as an outpatient.   2.  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  Follow-up with neurosurgery is recommended at this time.   3.  Arm and shoulder pain: Related to his tumor.  I recommend continuing pain management as you are doing.  4.  Disposition: I have no objection to discharge after obtaining tissue biopsy and controlling his pain.  We will arrange follow-up at [facility] after his discharge.   25  minutes were dedicated to this encounter.  The time was dedicated to reviewing CT scan imaging studies, treatment choices, future plan of care discussion and complications noted to therapy.    LOS: 3 days   Zola Button 09/05/2020, 8:24 AM

## 2020-09-05 NOTE — Progress Notes (Signed)
Progress Note    Miguel Colon  URK:[id number] DOB: 10/18/1955  DOA: 09/02/2020 PCP: [name], MD      Brief Narrative:    Medical records reviewed and are as summarized below:  Miguel Colon is a 65 y.o. male  with medical history significant of asthma, hypothyroidism, hyperlipidemia who was diagnosed with left lung apex on June 6, '22. He has not yet had follow up. He has been having increasing numbness and pain in the left upper extremity.  He was admitted to the hospital for pain control.  Oncologist and neurosurgeon were consulted.  Case was discussed with Dr. Christella Noa, neurosurgeon, who said that there was no indication for urgent surgical intervention.  He recommended management from oncologist.  Patient underwent CT-guided lung biopsy.  Palliative care team was consulted to assist with pain control.   Assessment/Plan:   Active Problems:   Chronic back pain   Hypercholesteremia   Hypothyroidism   Pancoast syndrome, left (HCC)    Body mass index is 23.63 kg/m.   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  CT abdomen and pelvis showed left lung base nodule suspicious for metastatic disease.  No evidence of brain metastasis on MRI brain without contrast.  He said his pain is very severe and not controlled with current analgesics.  Increase morphine from 2 mg to 4 mg every 4 hours as needed for pain.  Continue oxycodone.  Consulted palliative care team to assist with pain management.  Nausea and vomiting: No vomiting today.  Continue antiemetics as needed.  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 Christella Noa, neurosurgeon on 09/03/2020.  He said there was no indication for urgent surgery at this time.  Outpatient follow-up with neurosurgeon.  Hypothyroidism: TSH is  96.040 and FT4 < 0.25.  Continue Synthroid.  Medical nonadherence: The importance of medical adherence was emphasized.  Tobacco use disorder: Counseled to quit smoking cigarettes.   Diet Order             Diet NPO time specified Except for: Sips with Meds  Diet effective now                      Consultants: Oncologist Neurosurgeon  Procedures: None    Medications:    atorvastatin  20 mg Oral q1800   heparin  5,000 Units Subcutaneous Q8H   levothyroxine  200 mcg Oral QAC breakfast   nicotine  14 mg Transdermal Daily   oxyCODONE  10 mg Oral Q6H   senna  1 tablet Oral BID   Continuous Infusions:  promethazine (PHENERGAN) injection (IM or IVPB) Stopped (09/04/20 0950)     Anti-infectives (From admission, onward)    None              Family Communication/Anticipated D/C date and plan/Code Status   DVT prophylaxis: heparin injection 5,000 Units Start: 09/05/20 2200     Code Status: Full Code  Family Communication: None Disposition Plan:    Status is: Inpatient  Remains inpatient appropriate because:Inpatient level of care appropriate due to severity of illness  Dispo: The patient is from: Home              Anticipated d/c is to: Home              Patient currently is not medically stable  to d/c.   Difficult to place patient No           Subjective:   C/o nausea.  He said he vomited yesterday.  He supposed to drink oral contrast for CT abdomen pelvis that he thinks is going to be difficult because of significant nausea.  Objective:    Vitals:   09/04/20 0942 09/04/20 1311 09/04/20 2142 09/05/20 0540  BP: 115/89 112/84 96/71 126/90  Pulse: 61 71 67 67  Resp: 16 16 18 18   Temp: 97.8 F (36.6 C) 97.7 F (36.5 C) 98.8 F (37.1 C) 98.6 F (37 C)  TempSrc: Oral Oral Oral Oral  SpO2: 96% 97% 93% 96%  Weight:      Height:       No data found.   Intake/Output Summary (Last 24 hours) at 09/05/2020 1045 Last data filed at  09/05/2020 1000 Gross per 24 hour  Intake 530 ml  Output 0 ml  Net 530 ml   Filed Weights   09/02/20 1218  Weight: 72.6 kg    Exam:  GEN: NAD SKIN: No rash EYES: EOMI ENT: MMM CV: RRR PULM: CTA B ABD: soft, ND, NT, +BS CNS: AAO x 3, non focal EXT: No edema or tenderness        Data Reviewed:   I have personally reviewed following labs and imaging studies:  Labs: Labs show the following:   Basic Metabolic Panel: Recent Labs  Lab 09/02/20 1335 09/03/20 0332 09/04/20 0414  NA 137 135 136  K 3.6 3.7 4.0  CL 102 102 102  CO2 26 26 28   GLUCOSE 83 91 83  BUN 16 15 13   CREATININE 0.99 0.77 1.04  CALCIUM 8.6* 7.9* 8.7*  MG  --   --  1.9   GFR Estimated Creatinine Clearance: 71.8 mL/min (by C-G formula based on SCr of 1.04 mg/dL). Liver Function Tests: Recent Labs  Lab 09/02/20 1335  AST 30  ALT 21  ALKPHOS 36*  BILITOT 0.5  PROT 7.7  ALBUMIN 3.6   No results for input(s): LIPASE, AMYLASE in the last 168 hours. No results for input(s): AMMONIA in the last 168 hours. Coagulation profile No results for input(s): INR, PROTIME in the last 168 hours.  CBC: Recent Labs  Lab 09/02/20 1335 09/04/20 0414  WBC 4.8 4.1  NEUTROABS 2.3  --   HGB 13.8 12.6*  HCT 41.3 37.9*  MCV 99.3 99.0  PLT 253 203   Cardiac Enzymes: No results for input(s): CKTOTAL, CKMB, CKMBINDEX, TROPONINI in the last 168 hours. BNP (last 3 results) No results for input(s): PROBNP in the last 8760 hours. CBG: No results for input(s): GLUCAP in the last 168 hours. D-Dimer: No results for input(s): DDIMER in the last 72 hours. Hgb A1c: No results for input(s): HGBA1C in the last 72 hours. Lipid Profile: No results for input(s): CHOL, HDL, LDLCALC, TRIG, CHOLHDL, LDLDIRECT in the last 72 hours. Thyroid function studies: Recent Labs    09/03/20 0332  TSH 96.040*   Anemia work up: No results for input(s): VITAMINB12, FOLATE, FERRITIN, TIBC, IRON, RETICCTPCT in the last 72  hours. Sepsis Labs: Recent Labs  Lab 09/02/20 1335 09/04/20 0414  WBC 4.8 4.1    Microbiology Recent Results (from the past 240 hour(s))  SARS CORONAVIRUS 2 (TAT 6-24 HRS) Nasopharyngeal Nasopharyngeal Swab     Status: None   Collection Time: 09/02/20  3:03 PM   Specimen: Nasopharyngeal Swab  Result Value Ref Range Status   SARS Coronavirus  2 NEGATIVE NEGATIVE Final    Comment: (NOTE) SARS-CoV-2 target nucleic acids are NOT DETECTED.  The SARS-CoV-2 RNA is generally detectable in upper and lower respiratory specimens during the acute phase of infection. Negative results do not preclude SARS-CoV-2 infection, do not rule out co-infections with other pathogens, and should not be used as the sole basis for treatment or other patient management decisions. Negative results must be combined with clinical observations, patient history, and epidemiological information. The expected result is Negative.  Fact Sheet for Patients: SugarRoll.be  Fact Sheet for Healthcare Providers: https://www.woods-mathews.com/  This test is not yet approved or cleared by the [place] FDA and  has been authorized for detection and/or diagnosis of SARS-CoV-2 by FDA under an Emergency Use Authorization (EUA). This EUA will remain  in effect (meaning this test can be used) for the duration of the COVID-19 declaration under Se ction 564(b)(1) of the Act, 21 U.S.C. section 360bbb-3(b)(1), unless the authorization is terminated or revoked sooner.  Performed at [facility], [place] [address] [place], [place] [address]     Procedures and diagnostic studies:  CT ABDOMEN PELVIS W CONTRAST  Result Date: 09/04/2020 CLINICAL DATA:  Follow-up to lung cancer diagnosis. Evaluate for metastatic disease. EXAM: CT ABDOMEN AND PELVIS WITH CONTRAST TECHNIQUE: Multidetector CT imaging of the abdomen and pelvis was performed using the standard protocol following bolus  administration of intravenous contrast. CONTRAST:  127mL OMNIPAQUE IOHEXOL 300 MG/ML  SOLN COMPARISON:  CT abdomen and pelvis 11/09/2018 FINDINGS: Lower chest: Linear atelectasis or scarring in the right lung base. Circumscribed nodule in the left base measuring 1.1 cm diameter, likely metastatic. Hepatobiliary: No focal liver lesions identified. Gallbladder and bile ducts are normal. Pancreas: Unremarkable. No pancreatic ductal dilatation or surrounding inflammatory changes. Spleen: Normal in size without focal abnormality. Adrenals/Urinary Tract: No adrenal gland nodules. Kidneys are symmetrical. Subcentimeter cysts. No solid mass. No hydronephrosis or hydroureter. Bladder is unremarkable. Stomach/Bowel: Stomach, small bowel, and colon are not abnormally distended. The colon is diffusely stool-filled with prominent stool in the rectum. Likely constipation. No wall thickening or inflammatory changes. Appendix is not identified. Vascular/Lymphatic: Aortic atherosclerosis. No enlarged abdominal or pelvic lymph nodes. Reproductive: Prostate is unremarkable. Other: No free air or free fluid in the abdomen. Abdominal wall musculature appears intact. Musculoskeletal: Mild degenerative changes in the spine. No destructive or expansile bone lesions are appreciated. There a few scattered subcentimeter foci of sclerosis, likely representing bone islands. IMPRESSION: 1. Left lung base nodule, likely metastatic. 2. No metastatic disease demonstrated in the abdomen or pelvis. 3. Diffusely stool-filled colon with prominent stool filled rectum, likely constipation. Electronically Signed   By: Lucienne Capers M.D.   On: 09/04/2020 19:12               LOS: 3 days   Kyrie Bun  [facility]   Pager on [email or web address]. If 7PM-7AM, please contact night-coverage at [email or web address]     09/05/2020, 10:45 AM

## 2020-09-05 NOTE — Progress Notes (Signed)
MEDICATION-RELATED CONSULT NOTE   IR Procedure Consult - Anticoagulant/Antiplatelet PTA/Inpatient Med List Review by Pharmacist    Procedure: CT core biopsy LUL lung mass    Completed: 09/05/20 at 13:52  Post-Procedural bleeding risk per IR MD assessment:  standard  Antithrombotic medications on inpatient or PTA profile prior to procedure:   Heparin 5000 units SQ q8h    Recommended restart time per IR Post-Procedure Guidelines:  Day 0 (at lest 6 hours or at next standard dose interval)   Other considerations:      Plan:   Resume SQ heparin at 22:00 tonight  Peggyann Juba, PharmD, BCPS Pharmacy: [phone] 09/05/2020 1:56 PM

## 2020-09-05 NOTE — Care Management Important Message (Signed)
Important Message  Patient Details IM Letter given to the Patient. Name: Miguel Colon MRN: [id number] Date of Birth: [birth year]   Medicare Important Message Given:  Yes     Kerin Salen 09/05/2020, 10:46 AM

## 2020-09-05 NOTE — Consult Note (Signed)
Chief Complaint: Patient was seen in consultation today for CT guided left lung mass biopsy Chief Complaint  Patient presents with   Chest Pain    Referring Physician(s): Shadad,F  Supervising Physician: Arne Cleveland  Patient Status: [facility] - In-pt  History of Present Illness: Miguel Colon is a 65 y.o. male smoker with past medical history of asthma, migraine headaches, thyroid disease who recently transferred from [facility] to [facility] with left arm/shoulder /chest wall pain along with some LUE paresthesias.  He underwent imaging at [facility] on 08/04/2020 which revealed a 5.5 cm left lung apex mass likely involving the left brachial plexus and compatible with primary lung neoplasm along with a 1.1 centimeter noncalcified left lower lobe nodule and 2.3 cm calcified nodule in the right upper lobe  and partially obscured nodular scarring in the left upper lobe.  MRI of the brain was negative for any acute abnormality and CT abdomen pelvis revealed no metastatic disease.  Patient also has cervical disc degeneration greatest at C3-4 with severe spinal stenosis and cord signal abnormality suggesting spondylitic myelopathy.  Request now received from oncology for CT-guided left lung mass biopsy for further evaluation.  Past Medical History:  Diagnosis Date   Asthma    Back pain    Migraines    Thyroid disease     Past Surgical History:  Procedure Laterality Date   THYROIDECTOMY      Allergies: Patient has no known allergies.  Medications: Prior to Admission medications   Medication Sig Start Date End Date Taking? Authorizing Provider  atorvastatin (LIPITOR) 20 MG tablet Take 20 mg by mouth daily at 6 PM.    Yes [provider]  levothyroxine (SYNTHROID) 200 MCG tablet Take 200 mcg by mouth daily before breakfast.   Yes [provider]  naproxen sodium (ALEVE) 220 MG tablet Take 220 mg by mouth daily as needed (pain).   Yes  [provider]  polyethylene glycol-electrolytes (TRILYTE) 420 g solution Take 4,000 mLs by mouth as directed. Patient not taking: No sig reported 01/16/19   Rourk, Cristopher Estimable, MD     Family History  Problem Relation Age of Onset   Hypertension Mother    Hypertension Father    Colon cancer Father 57    Social History   Socioeconomic History   Marital status: Legally Separated    Spouse name: Not on file   Number of children: Not on file   Years of education: Not on file   Highest education level: Not on file  Occupational History   Not on file  Tobacco Use   Smoking status: Every Day    Packs/day: 0.50    Pack years: 0.00    Types: Cigarettes   Smokeless tobacco: Never   Tobacco comments:    down to 6 cigarettes a day   Vaping Use   Vaping Use: Never used  Substance and Sexual Activity   Alcohol use: No   Drug use: No   Sexual activity: Not on file  Other Topics Concern   Not on file  Social History Narrative   Not on file   Social Determinants of Health   Financial Resource Strain: Not on file  Food Insecurity: Not on file  Transportation Needs: Not on file  Physical Activity: Not on file  Stress: Not on file  Social Connections: Not on file      Review of Systems see above; denies fever, headache, worsening dyspnea, cough, abdominal pain,  back pain, vomiting or bleeding.  He has had some recent nausea.  Vital Signs: BP 126/90 (BP Location: Right Arm)   Pulse 67   Temp 98.6 F (37 C) (Oral)   Resp 18   Ht 5\' 9"  (1.753 m)   Wt 160 lb (72.6 kg)   SpO2 96%   BMI 23.63 kg/m   Physical Exam awake, alert.  Chest with distant breath sounds bilaterally.  Heart with regular rate and rhythm.  Abdomen soft, positive bowel sounds, nontender.  No lower extremity edema.  Imaging: MR BRAIN WO CONTRAST  Result Date: 09/02/2020 CLINICAL DATA:  Left arm numbness. EXAM: MRI HEAD WITHOUT CONTRAST TECHNIQUE: Multiplanar, multiecho pulse sequences of the  brain and surrounding structures were obtained without intravenous contrast. COMPARISON:  Head CT 12/17/2014 FINDINGS: Brain: There is no evidence of an acute infarct, intracranial hemorrhage, mass, midline shift, or extra-axial fluid collection. Small T2 hyperintensities in the cerebral white matter bilaterally are nonspecific but compatible with mild chronic small vessel ischemic disease. The ventricles and sulci are within normal limits for age. Vascular: Major intracranial vascular flow voids are preserved. Skull and upper cervical spine: Unremarkable bone marrow signal. Sinuses/Orbits: Unremarkable orbits. Paranasal sinuses and mastoid air cells are clear. Other: None. IMPRESSION: 1. No acute intracranial abnormality. 2. Mild chronic small vessel ischemic disease. Electronically Signed   By: Logan Bores M.D.   On: 09/02/2020 14:30   MR Cervical Spine Wo Contrast  Result Date: 09/02/2020 CLINICAL DATA:  Left arm numbness. EXAM: MRI CERVICAL SPINE WITHOUT CONTRAST TECHNIQUE: Multiplanar, multisequence MR imaging of the cervical spine was performed. No intravenous contrast was administered. COMPARISON:  Chest CTA 08/04/2020 FINDINGS: The study is motion degraded including severe motion on axial T1 and T2 gradient echo sequences. Alignment: Trace retrolisthesis of C3 on C4. Straightening of the normal cervical lordosis. Vertebrae: No fracture or suspicious marrow lesion. Degenerative endplate changes at Z6-1 including mild degenerative edema associated with moderate disc space narrowing. Cord: Abnormal T2 hyperintensity in the cord bilaterally at C3-4. Posterior Fossa, vertebral arteries, paraspinal tissues: Partial imaging of known left apical lung mass which invades into the soft tissues superior and posterior to the lung apex including the paraspinal soft tissues at T1-2. unremarkable included posterior fossa. Disc levels: C2-3: Disc bulging and right uncovertebral spurring result in mild right neural  foraminal stenosis without significant spinal stenosis. C3-4: Disc bulging and uncovertebral spurring result in severe spinal stenosis with severe cord flattening and severe bilateral neural foraminal stenosis. C4-5: Disc bulging, uncovertebral spurring, and mild facet arthrosis result in moderate right neural foraminal stenosis without significant spinal stenosis. C5-6: Mild disc bulging and left facet arthrosis without stenosis. C6-7: Negative. C7-T1: Negative. IMPRESSION: 1. Motion degraded examination. 2. Partial imaging of known invasive left apical lung mass which likely involves the left brachial plexus. 3. Cervical disc degeneration greatest at C3-4 where there is severe spinal stenosis and cord signal abnormality suggesting spondylotic myelopathy. 4. Severe bilateral neural foraminal stenosis at C3-4. Electronically Signed   By: Logan Bores M.D.   On: 09/02/2020 14:46   CT ABDOMEN PELVIS W CONTRAST  Result Date: 09/04/2020 CLINICAL DATA:  Follow-up to lung cancer diagnosis. Evaluate for metastatic disease. EXAM: CT ABDOMEN AND PELVIS WITH CONTRAST TECHNIQUE: Multidetector CT imaging of the abdomen and pelvis was performed using the standard protocol following bolus administration of intravenous contrast. CONTRAST:  174mL OMNIPAQUE IOHEXOL 300 MG/ML  SOLN COMPARISON:  CT abdomen and pelvis 11/09/2018 FINDINGS: Lower chest: Linear atelectasis or scarring in  the right lung base. Circumscribed nodule in the left base measuring 1.1 cm diameter, likely metastatic. Hepatobiliary: No focal liver lesions identified. Gallbladder and bile ducts are normal. Pancreas: Unremarkable. No pancreatic ductal dilatation or surrounding inflammatory changes. Spleen: Normal in size without focal abnormality. Adrenals/Urinary Tract: No adrenal gland nodules. Kidneys are symmetrical. Subcentimeter cysts. No solid mass. No hydronephrosis or hydroureter. Bladder is unremarkable. Stomach/Bowel: Stomach, small bowel, and colon are  not abnormally distended. The colon is diffusely stool-filled with prominent stool in the rectum. Likely constipation. No wall thickening or inflammatory changes. Appendix is not identified. Vascular/Lymphatic: Aortic atherosclerosis. No enlarged abdominal or pelvic lymph nodes. Reproductive: Prostate is unremarkable. Other: No free air or free fluid in the abdomen. Abdominal wall musculature appears intact. Musculoskeletal: Mild degenerative changes in the spine. No destructive or expansile bone lesions are appreciated. There a few scattered subcentimeter foci of sclerosis, likely representing bone islands. IMPRESSION: 1. Left lung base nodule, likely metastatic. 2. No metastatic disease demonstrated in the abdomen or pelvis. 3. Diffusely stool-filled colon with prominent stool filled rectum, likely constipation. Electronically Signed   By: Lucienne Capers M.D.   On: 09/04/2020 19:12    Labs:  CBC: Recent Labs    08/04/20 1325 09/02/20 1335 09/04/20 0414  WBC 5.5 4.8 4.1  HGB 14.1 13.8 12.6*  HCT 42.9 41.3 37.9*  PLT 295 253 203    COAGS: No results for input(s): INR, APTT in the last 8760 hours.  BMP: Recent Labs    08/04/20 1325 09/02/20 1335 09/03/20 0332 09/04/20 0414  NA 134* 137 135 136  K 3.9 3.6 3.7 4.0  CL 101 102 102 102  CO2 28 26 26 28   GLUCOSE 86 83 91 83  BUN 15 16 15 13   CALCIUM 8.7* 8.6* 7.9* 8.7*  CREATININE 1.10 0.99 0.77 1.04  GFRNONAA >60 >60 >60 >60    LIVER FUNCTION TESTS: Recent Labs    08/04/20 1325 09/02/20 1335  BILITOT 0.3 0.5  AST 22 30  ALT 15 21  ALKPHOS 40 36*  PROT 6.9 7.7  ALBUMIN 3.1* 3.6    TUMOR MARKERS: No results for input(s): AFPTM, CEA, CA199, CHROMGRNA in the last 8760 hours.  Assessment and Plan: 65 y.o. male smoker with past medical history of asthma, migraine headaches, thyroid disease who recently transferred from [facility]  to [facility] with left arm/shoulder /chest wall pain along with some LUE  paresthesias.  He underwent imaging at [facility] on 08/04/2020 which revealed a 5.5 cm left lung apex mass likely involving the left brachial plexus and compatible with primary lung neoplasm along with a 1.1 centimeter noncalcified left lower lobe nodule and 2.3 cm calcified nodule in the right upper lobe  and partially obscured nodular scarring in the left upper lobe.  MRI of the brain was negative for any acute abnormality and CT abdomen pelvis revealed no metastatic disease.  Patient also has cervical disc degeneration greatest at C3-4 with severe spinal stenosis and cord signal abnormality suggesting spondylitic myelopathy.  Request now received from oncology for CT-guided left lung mass biopsy for further evaluation.  Imaging studies have been reviewed by Dr. Vernard Gambles.  Details/risks of procedure, including but not limited to, internal bleeding, infection, injury to adjacent structures, pneumothorax requiring chest tube placement discussed with patient with his understanding and consent.  Procedure scheduled for today.   Thank you for this interesting consult.  I greatly enjoyed meeting Miguel Colon and look forward to participating in  their care.  A copy of this report was sent to the requesting provider on this date.  Electronically Signed: D. Rowe Robert, PA-C 09/05/2020, 9:42 AM   I spent a total of   30 minutes in face to face in clinical consultation, greater than 50% of which was counseling/coordinating care for CT-guided left lung mass biopsy

## 2020-09-06 DIAGNOSIS — E038 Other specified hypothyroidism: Secondary | ICD-10-CM

## 2020-09-06 DIAGNOSIS — G8929 Other chronic pain: Secondary | ICD-10-CM

## 2020-09-06 DIAGNOSIS — M5440 Lumbago with sciatica, unspecified side: Secondary | ICD-10-CM

## 2020-09-06 NOTE — Progress Notes (Addendum)
PROGRESS NOTE  Miguel Colon [id number]:[id number] DOB: 11/01/1955 DOA: 09/02/2020 PCP: [name], MD  HPI/Recap of past 24 hours: .  Patient was transferred from [facility] admitted for Pancoast syndrome status post CT-guided core lung biopsy on September 05, 2020. Patient seen and examined at bedside denies any new complaints   Assessment/Plan: Active Problems:   Chronic back pain   Hypercholesteremia   Hypothyroidism   Pancoast syndrome, left ([place])  1.  Pancoast syndrome on the left.  Lung Status post: Ultrasound-guided lung Biopsy result is pending   2.  Chronic back pain continue current pain management.  Palliative care consult is pending.  3.  Hypothyroidism stable continue current management  4.  Hypercholesterolemia: Continue current home meds  Code Status: FULL   Severity of Illness: The appropriate patient status for this patient is INPATIENT. Inpatient status is judged to be reasonable and necessary in order to provide the required intensity of service to ensure the patient's safety. The patient's presenting symptoms, physical exam findings, and initial radiographic and laboratory data in the context of their chronic comorbidities is felt to place them at high risk for further clinical deterioration. Furthermore, it is not anticipated that the patient will be medically stable for discharge from the hospital within 2 midnights of admission. The following factors support the patient status of inpatient.   Needs pain control Waiting on biopsy report * I certify that at the point of admission it is my clinical judgment that the patient will require inpatient hospital care spanning beyond 2 midnights from the point of admission due to high intensity of service, high risk for further deterioration and high frequency of surveillance required.*   Family Communication: Patient  Disposition Plan: Home when stable   Consultants: Oncology Intervention  radiology Neurosurgery  Procedures: Core biopsy of the lung 10/18/2020  Antimicrobials: None  DVT prophylaxis: Heparin   Objective: Vitals:   09/05/20 1352 09/05/20 1405 09/05/20 2134 09/06/20 0601  BP: (!) 122/94 132/90 99/81 101/72  Pulse:  76 75 71  Resp:  16 18 14   Temp:   98.6 F (37 C) 98.4 F (36.9 C)  TempSrc:   Oral Oral  SpO2: 98% 99% 96% 95%  Weight:      Height:        Intake/Output Summary (Last 24 hours) at 09/06/2020 0834 Last data filed at 09/06/2020 0601 Gross per 24 hour  Intake 780 ml  Output 0 ml  Net 780 ml   Filed Weights   09/02/20 1218  Weight: 72.6 kg   Body mass index is 23.63 kg/m.  Exam:  General: 65 y.o. year-old male well developed well nourished in no acute distress.  Alert and oriented x3. Cardiovascular: Regular rate and rhythm with no rubs or gallops.  No thyromegaly or JVD noted.   Respiratory: Clear to auscultation with no wheezes or rales. Good inspiratory effort. Abdomen: Soft nontender nondistended with normal bowel sounds x4 quadrants. Musculoskeletal: No lower extremity edema. 2/4 pulses in all 4 extremities. Skin: No ulcerative lesions noted or rashes, Psychiatry: Mood is appropriate for condition and setting    Data Reviewed: CBC: Recent Labs  Lab 09/02/20 1335 09/04/20 0414  WBC 4.8 4.1  NEUTROABS 2.3  --   HGB 13.8 12.6*  HCT 41.3 37.9*  MCV 99.3 99.0  PLT 253 093   Basic Metabolic Panel: Recent Labs  Lab 09/02/20 1335 09/03/20 0332 09/04/20 0414  NA 137 135 136  K 3.6 3.7 4.0  CL 102 102 102  CO2 26 26 28   GLUCOSE 83 91 83  BUN 16 15 13   CREATININE 0.99 0.77 1.04  CALCIUM 8.6* 7.9* 8.7*  MG  --   --  1.9   GFR: Estimated Creatinine Clearance: 71.8 mL/min (by C-G formula based on SCr of 1.04 mg/dL). Liver Function Tests: Recent Labs  Lab 09/02/20 1335  AST 30  ALT 21  ALKPHOS 36*  BILITOT 0.5  PROT 7.7  ALBUMIN 3.6   No results for input(s): LIPASE, AMYLASE in the last 168  hours. No results for input(s): AMMONIA in the last 168 hours. Coagulation Profile: No results for input(s): INR, PROTIME in the last 168 hours. Cardiac Enzymes: No results for input(s): CKTOTAL, CKMB, CKMBINDEX, TROPONINI in the last 168 hours. BNP (last 3 results) No results for input(s): PROBNP in the last 8760 hours. HbA1C: No results for input(s): HGBA1C in the last 72 hours. CBG: No results for input(s): GLUCAP in the last 168 hours. Lipid Profile: No results for input(s): CHOL, HDL, LDLCALC, TRIG, CHOLHDL, LDLDIRECT in the last 72 hours. Thyroid Function Tests: No results for input(s): TSH, T4TOTAL, FREET4, T3FREE, THYROIDAB in the last 72 hours. Anemia Panel: No results for input(s): VITAMINB12, FOLATE, FERRITIN, TIBC, IRON, RETICCTPCT in the last 72 hours. Urine analysis:    Component Value Date/Time   COLORURINE YELLOW 09/02/2020 1848   APPEARANCEUR CLEAR 09/02/2020 1848   LABSPEC 1.021 09/02/2020 1848   PHURINE 6.0 09/02/2020 1848   GLUCOSEU NEGATIVE 09/02/2020 1848   HGBUR NEGATIVE 09/02/2020 1848   BILIRUBINUR NEGATIVE 09/02/2020 1848   KETONESUR NEGATIVE 09/02/2020 1848   PROTEINUR NEGATIVE 09/02/2020 1848   UROBILINOGEN 1.0 12/17/2014 0040   NITRITE NEGATIVE 09/02/2020 1848   LEUKOCYTESUR NEGATIVE 09/02/2020 1848   Sepsis Labs: @LABRCNTIP (procalcitonin:4,lacticidven:4)  ) Recent Results (from the past 240 hour(s))  SARS CORONAVIRUS 2 (TAT 6-24 HRS) Nasopharyngeal Nasopharyngeal Swab     Status: None   Collection Time: 09/02/20  3:03 PM   Specimen: Nasopharyngeal Swab  Result Value Ref Range Status   SARS Coronavirus 2 NEGATIVE NEGATIVE Final    Comment: (NOTE) SARS-CoV-2 target nucleic acids are NOT DETECTED.  The SARS-CoV-2 RNA is generally detectable in upper and lower respiratory specimens during the acute phase of infection. Negative results do not preclude SARS-CoV-2 infection, do not rule out co-infections with other pathogens, and should not be  used as the sole basis for treatment or other patient management decisions. Negative results must be combined with clinical observations, patient history, and epidemiological information. The expected result is Negative.  Fact Sheet for Patients: SugarRoll.be  Fact Sheet for Healthcare Providers: https://www.woods-mathews.com/  This test is not yet approved or cleared by the [place] FDA and  has been authorized for detection and/or diagnosis of SARS-CoV-2 by FDA under an Emergency Use Authorization (EUA). This EUA will remain  in effect (meaning this test can be used) for the duration of the COVID-19 declaration under Se ction 564(b)(1) of the Act, 21 U.S.C. section 360bbb-3(b)(1), unless the authorization is terminated or revoked sooner.  Performed at [facility] [place] [address], [place], [place] [address]       Studies: CT LUNG MASS BIOPSY  Result Date: 09/05/2020 Arne Cleveland, MD     09/05/2020  1:52 PM Procedure: CT core biopsy LUL lung mass  EBL:   minimal Complications:  none immediate See full dictation in [employer]. Dillard Cannon MD Main # [phone] Pager  [phone]    Scheduled Meds:  atorvastatin  20 mg Oral q1800   heparin  5,000 Units Subcutaneous Q8H   levothyroxine  200 mcg Oral QAC breakfast   nicotine  14 mg Transdermal Daily   oxyCODONE  10 mg Oral Q6H   senna  1 tablet Oral BID    Continuous Infusions:  promethazine (PHENERGAN) injection (IM or IVPB) Stopped (09/04/20 0950)     LOS: 4 days     Cristal Deer, MD [facility]  To reach me or the doctor on call, go to: [email or web address] Password [email or web address]  09/06/2020, 8:34 AM

## 2020-09-07 MED ORDER — RISAQUAD PO CAPS
2.0000 | ORAL_CAPSULE | Freq: Two times a day (BID) | ORAL | Status: DC
  Administered 2020-09-07 – 2020-09-09 (×5): 2 via ORAL
  Filled 2020-09-07 (×5): qty 2

## 2020-09-07 MED ORDER — MAGNESIUM CITRATE PO SOLN
0.5000 | Freq: Once | ORAL | Status: AC
  Administered 2020-09-07: 1 via ORAL
  Filled 2020-09-07: qty 296

## 2020-09-07 MED ORDER — FENTANYL CITRATE (PF) 100 MCG/2ML IJ SOLN
25.0000 ug | INTRAMUSCULAR | Status: DC | PRN
  Administered 2020-09-07: 25 ug via INTRAVENOUS
  Filled 2020-09-07: qty 2

## 2020-09-07 MED ORDER — KETOROLAC TROMETHAMINE 15 MG/ML IJ SOLN
15.0000 mg | Freq: Three times a day (TID) | INTRAMUSCULAR | Status: DC
  Administered 2020-09-07 – 2020-09-09 (×6): 15 mg via INTRAVENOUS
  Filled 2020-09-07 (×6): qty 1

## 2020-09-07 MED ORDER — GLYCERIN (LAXATIVE) 1.2 G RE SUPP
1.0000 | RECTAL | Status: DC | PRN
  Filled 2020-09-07: qty 1

## 2020-09-07 MED ORDER — METHADONE HCL 5 MG PO TABS
5.0000 mg | ORAL_TABLET | Freq: Two times a day (BID) | ORAL | Status: DC
Start: 2020-09-07 — End: 2020-09-09
  Administered 2020-09-07 – 2020-09-09 (×5): 5 mg via ORAL
  Filled 2020-09-07 (×5): qty 1

## 2020-09-07 MED ORDER — BISACODYL 5 MG PO TBEC
10.0000 mg | DELAYED_RELEASE_TABLET | Freq: Every day | ORAL | Status: DC
  Administered 2020-09-08: 10 mg via ORAL
  Filled 2020-09-07: qty 2

## 2020-09-07 MED ORDER — ACETAMINOPHEN 500 MG PO TABS
1000.0000 mg | ORAL_TABLET | Freq: Three times a day (TID) | ORAL | Status: DC
  Administered 2020-09-07 – 2020-09-09 (×5): 1000 mg via ORAL
  Filled 2020-09-07 (×5): qty 2

## 2020-09-07 MED ORDER — TRAZODONE HCL 50 MG PO TABS
50.0000 mg | ORAL_TABLET | Freq: Every day | ORAL | Status: DC
  Administered 2020-09-07 – 2020-09-08 (×2): 50 mg via ORAL
  Filled 2020-09-07 (×2): qty 1

## 2020-09-07 MED ORDER — LIDOCAINE 5 % EX PTCH
1.0000 | MEDICATED_PATCH | CUTANEOUS | Status: DC
  Administered 2020-09-07 – 2020-09-08 (×2): 1 via TRANSDERMAL
  Filled 2020-09-07 (×3): qty 1

## 2020-09-07 NOTE — Plan of Care (Signed)

## 2020-09-07 NOTE — Consult Note (Signed)
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 He is s/p biopsy of mass and is awaiting results and treatment options. Palliative care consultation for pain management.  Pain is described as persistently throbbing. His entire forearm left side is numb and feels like it is heavy. He has shooting lighting bolt pain down the left arm. Pain is particularly bad at night. In general he is stoic with his pain and does not complain although it is wearing on him and becomes at times almost unbearable. He tells me he tries to distract himself by reading books on his ipad and watching TV. He is very anxious awaiting next steps. I encouraged mobility.  He is also having issues with severe constipation. He had issues prior to taking opioids and now it is worse.   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  He is relatively opioid naive but is engaged with his care and plan.  Toradol q6 hours for inflammatory pain Scheduled Tylenol 1000mg  q6 hours Methadone 5mg  BID (monitor closely for sedation) Fentanyl IV for breakthrough pain until methadone can reach steady state in 3-5 days. Discontinued all other oral and IV opioids Lidoderm patch to left upper neck  Constipation: May need Relistor or Movantik Will start with Magnesium Citrate Dulcolax BID Glycerin suppository PRN Encouraged fluid intake, fiber and probiotic supplementation  Time: 50 minutes Greater than 50%  of this time was spent counseling and coordinating care related to the above assessment and plan.   Lane Hacker, DO Palliative Medicine

## 2020-09-07 NOTE — Progress Notes (Addendum)
PROGRESS NOTE  Mindy Behnken TFT:[id number] DOB: 1955-03-16 DOA: 09/02/2020 PCP: [name], MD  HPI/Recap of past 24 hours: .  Patient was transferred from [facility] admitted for Pancoast syndrome status post CT-guided core lung biopsy on September 05, 2020. Patient seen and examined at bedside denies any new complaints  September 07, 2020: Patient seen and examined at bedside Complaining of severe pain in his left shoulder going down to the left arm feeling really bad and that made current medications for pain is a waste of resources because they are not helping.  he still awaiting palliative consult. His biopsy is not available yet   Assessment/Plan: Active Problems:   Chronic back pain   Hypercholesteremia   Hypothyroidism   Pancoast syndrome, left ([place])  1.  Panchal syndrome on the left. Status post: Ultrasound-guided lung Biopsy result is pending  2.  Chronic back pain continue current pain management.   Palliative care consult have seen patient today and they have recommendations for pain management  3.  Hypothyroidism stable continue current management  4.  Hypercholesterolemia: Continue current home meds  Code Status: FULL   Severity of Illness: The appropriate patient status for this patient is INPATIENT. Inpatient status is judged to be reasonable and necessary in order to provide the required intensity of service to ensure the patient's safety. The patient's presenting symptoms, physical exam findings, and initial radiographic and laboratory data in the context of their chronic comorbidities is felt to place them at high risk for further clinical deterioration. Furthermore, it is not anticipated that the patient will be medically stable for discharge from the hospital within 2 midnights of admission. The following factors support the patient status of inpatient.   Needs pain control Waiting on biopsy report * I certify that at the point of admission it is my clinical  judgment that the patient will require inpatient hospital care spanning beyond 2 midnights from the point of admission due to high intensity of service, high risk for further deterioration and high frequency of surveillance required.*   Family Communication: Patient  Disposition Plan: Home when stable   Consultants: Oncology Intervention radiology Neurosurgery  Procedures: Core biopsy of the lung 10/18/2020  Antimicrobials: None  DVT prophylaxis: Heparin   Objective: Vitals:   09/06/20 1409 09/06/20 2220 09/07/20 0626 09/07/20 1346  BP: 94/69 98/77 114/84 112/78  Pulse: 71 71 70 (!) 57  Resp: 18 18 18 18   Temp:  98.7 F (37.1 C) 98.2 F (36.8 C) 98.2 F (36.8 C)  TempSrc:  Oral Oral Oral  SpO2: 92% 95% 97% 94%  Weight:      Height:        Intake/Output Summary (Last 24 hours) at 09/07/2020 1533 Last data filed at 09/07/2020 1400 Gross per 24 hour  Intake 960 ml  Output 0 ml  Net 960 ml    Filed Weights   09/02/20 1218  Weight: 72.6 kg   Body mass index is 23.63 kg/m.  Exam:  General: 65 y.o. year-old male well developed well nourished in no acute distress.  Alert and oriented x3. Cardiovascular: Regular rate and rhythm with no rubs or gallops.  No thyromegaly or JVD noted.   Respiratory: Clear to auscultation with no wheezes or rales. Good inspiratory effort. Abdomen: Soft nontender nondistended with normal bowel sounds x4 quadrants. Musculoskeletal: No lower extremity edema. 2/4 pulses in all 4 extremities. Skin: No ulcerative lesions noted or rashes, Psychiatry: Mood is appropriate for condition and setting  Data Reviewed: CBC: Recent Labs  Lab 09/02/20 1335 09/04/20 0414  WBC 4.8 4.1  NEUTROABS 2.3  --   HGB 13.8 12.6*  HCT 41.3 37.9*  MCV 99.3 99.0  PLT 253 720    Basic Metabolic Panel: Recent Labs  Lab 09/02/20 1335 09/03/20 0332 09/04/20 0414  NA 137 135 136  K 3.6 3.7 4.0  CL 102 102 102  CO2 26 26 28   GLUCOSE 83 91 83   BUN 16 15 13   CREATININE 0.99 0.77 1.04  CALCIUM 8.6* 7.9* 8.7*  MG  --   --  1.9    GFR: Estimated Creatinine Clearance: 71.8 mL/min (by C-G formula based on SCr of 1.04 mg/dL). Liver Function Tests: Recent Labs  Lab 09/02/20 1335  AST 30  ALT 21  ALKPHOS 36*  BILITOT 0.5  PROT 7.7  ALBUMIN 3.6    No results for input(s): LIPASE, AMYLASE in the last 168 hours. No results for input(s): AMMONIA in the last 168 hours. Coagulation Profile: No results for input(s): INR, PROTIME in the last 168 hours. Cardiac Enzymes: No results for input(s): CKTOTAL, CKMB, CKMBINDEX, TROPONINI in the last 168 hours. BNP (last 3 results) No results for input(s): PROBNP in the last 8760 hours. HbA1C: No results for input(s): HGBA1C in the last 72 hours. CBG: No results for input(s): GLUCAP in the last 168 hours. Lipid Profile: No results for input(s): CHOL, HDL, LDLCALC, TRIG, CHOLHDL, LDLDIRECT in the last 72 hours. Thyroid Function Tests: No results for input(s): TSH, T4TOTAL, FREET4, T3FREE, THYROIDAB in the last 72 hours. Anemia Panel: No results for input(s): VITAMINB12, FOLATE, FERRITIN, TIBC, IRON, RETICCTPCT in the last 72 hours. Urine analysis:    Component Value Date/Time   COLORURINE YELLOW 09/02/2020 1848   APPEARANCEUR CLEAR 09/02/2020 1848   LABSPEC 1.021 09/02/2020 1848   PHURINE 6.0 09/02/2020 1848   GLUCOSEU NEGATIVE 09/02/2020 1848   HGBUR NEGATIVE 09/02/2020 1848   BILIRUBINUR NEGATIVE 09/02/2020 1848   KETONESUR NEGATIVE 09/02/2020 1848   PROTEINUR NEGATIVE 09/02/2020 1848   UROBILINOGEN 1.0 12/17/2014 0040   NITRITE NEGATIVE 09/02/2020 1848   LEUKOCYTESUR NEGATIVE 09/02/2020 1848   Sepsis Labs: @LABRCNTIP (procalcitonin:4,lacticidven:4)  ) Recent Results (from the past 240 hour(s))  SARS CORONAVIRUS 2 (TAT 6-24 HRS) Nasopharyngeal Nasopharyngeal Swab     Status: None   Collection Time: 09/02/20  3:03 PM   Specimen: Nasopharyngeal Swab  Result Value Ref  Range Status   SARS Coronavirus 2 NEGATIVE NEGATIVE Final    Comment: (NOTE) SARS-CoV-2 target nucleic acids are NOT DETECTED.  The SARS-CoV-2 RNA is generally detectable in upper and lower respiratory specimens during the acute phase of infection. Negative results do not preclude SARS-CoV-2 infection, do not rule out co-infections with other pathogens, and should not be used as the sole basis for treatment or other patient management decisions. Negative results must be combined with clinical observations, patient history, and epidemiological information. The expected result is Negative.  Fact Sheet for Patients: SugarRoll.be  Fact Sheet for Healthcare Providers: https://www.woods-mathews.com/  This test is not yet approved or cleared by the [place] FDA and  has been authorized for detection and/or diagnosis of SARS-CoV-2 by FDA under an Emergency Use Authorization (EUA). This EUA will remain  in effect (meaning this test can be used) for the duration of the COVID-19 declaration under Se ction 564(b)(1) of the Act, 21 U.S.C. section 360bbb-3(b)(1), unless the authorization is terminated or revoked sooner.  Performed at [facility], [place]  [address] [place], [place] [address]       Studies: No results found.  Scheduled Meds:  acetaminophen  1,000 mg Oral TID   acidophilus  2 capsule Oral BID   atorvastatin  20 mg Oral q1800   [START ON 09/08/2020] bisacodyl  10 mg Oral Daily   heparin  5,000 Units Subcutaneous Q8H   ketorolac  15 mg Intravenous Q8H   levothyroxine  200 mcg Oral QAC breakfast   lidocaine  1 patch Transdermal Q24H   methadone  5 mg Oral Q12H   nicotine  14 mg Transdermal Daily   traZODone  50 mg Oral QHS    Continuous Infusions:  promethazine (PHENERGAN) injection (IM or IVPB) Stopped (09/04/20 0950)     LOS: 5 days     Cristal Deer, MD [facility]  To reach me or the doctor on  call, go to: [email or web address] Password [facility]  09/07/2020, 3:33 PM

## 2020-09-08 MED ORDER — METHYLNALTREXONE BROMIDE 12 MG/0.6ML ~~LOC~~ SOLN
12.0000 mg | Freq: Once | SUBCUTANEOUS | Status: AC
  Administered 2020-09-08: 12 mg via SUBCUTANEOUS
  Filled 2020-09-08: qty 0.6

## 2020-09-08 MED ORDER — SENNOSIDES-DOCUSATE SODIUM 8.6-50 MG PO TABS
1.0000 | ORAL_TABLET | Freq: Two times a day (BID) | ORAL | Status: DC
  Administered 2020-09-08: 1 via ORAL
  Filled 2020-09-08: qty 1

## 2020-09-08 MED ORDER — METHYLNALTREXONE BROMIDE 12 MG/0.6ML ~~LOC~~ SOLN: 12.0000 mg | Freq: Once | SUBCUTANEOUS | Status: DC

## 2020-09-08 MED ORDER — POLYETHYLENE GLYCOL 3350 17 G PO PACK
17.0000 g | PACK | Freq: Every day | ORAL | Status: DC
  Administered 2020-09-08: 17 g via ORAL
  Filled 2020-09-08: qty 1

## 2020-09-08 NOTE — Care Management Important Message (Signed)
Important Message  Patient Details IM Letter given to the Patient. Name: Enrrique Mierzwa MRN: [id number] Date of Birth: 05/27/1955   Medicare Important Message Given:  Yes     Kerin Salen 09/08/2020, 11:14 AM

## 2020-09-08 NOTE — Progress Notes (Signed)
Progress Note    Miguel Colon  MVE:[id number] DOB: 1955/12/16  DOA: 09/02/2020 PCP: [name], MD      Brief Narrative:    Medical records reviewed and are as summarized below:  Miguel Colon is a 65 y.o. male  with medical history significant of asthma, hypothyroidism, hyperlipidemia who was diagnosed with left lung apex on June 6, '22. He has not yet had follow up. He has been having increasing numbness and pain in the left upper extremity.  He was admitted to the hospital for pain control.  Oncologist and neurosurgeon were consulted.  Case was discussed with Dr. Christella Noa, neurosurgeon, who said that there was no indication for urgent surgical intervention.  He recommended management from oncologist.  Patient underwent CT-guided lung biopsy.  Palliative care team was consulted to assist with pain control.   Assessment/Plan:   Active Problems:   Chronic back pain   Hypercholesteremia   Hypothyroidism   Pancoast syndrome, left (HCC)    Body mass index is 23.63 kg/m.   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 CT abdomen and pelvis showed left lung base nodule suspicious for metastatic disease.  No evidence of brain metastasis on MRI brain without contrast.  Appreciate recommendations from palliative care team.  Continue IV fentanyl as needed for pain and methadone.  Continue analgesics.  Outpatient follow-up with oncologist.  Nausea and vomiting: Improved.    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 Christella Noa, neurosurgeon on 09/03/2020.  He said there was no indication for urgent surgery at this time.  Outpatient follow-up with neurosurgeon.  Hypothyroidism: TSH is 96.040 and FT4 <  0.25.  Continue Synthroid.  Medical nonadherence: The importance of medical adherence was emphasized.  Tobacco use disorder: Counseled to quit smoking cigarettes.   Diet Order             Diet regular Room service appropriate? Yes; Fluid consistency: Thin  Diet effective now                      Consultants: Oncologist Neurosurgeon  Procedures: None    Medications:    acetaminophen  1,000 mg Oral TID   acidophilus  2 capsule Oral BID   atorvastatin  20 mg Oral q1800   bisacodyl  10 mg Oral Daily   heparin  5,000 Units Subcutaneous Q8H   ketorolac  15 mg Intravenous Q8H   levothyroxine  200 mcg Oral QAC breakfast   lidocaine  1 patch Transdermal Q24H   methadone  5 mg Oral Q12H   nicotine  14 mg Transdermal Daily   polyethylene glycol  17 g Oral Daily   senna-docusate  1 tablet Oral BID   traZODone  50 mg Oral QHS   Continuous Infusions:  promethazine (PHENERGAN) injection (IM or IVPB) Stopped (09/04/20 0950)     Anti-infectives (From admission, onward)    None              Family Communication/Anticipated D/C date and plan/Code Status   DVT prophylaxis: heparin injection 5,000 Units Start: 09/05/20 2200     Code Status: Full Code  Family Communication: None Disposition Plan:    Status is: Inpatient  Remains inpatient appropriate because:Inpatient level of care appropriate due to severity of illness  Dispo: The patient  is from: Home              Anticipated d/c is to: Home              Patient currently is not medically stable to d/c.   Difficult to place patient No           Subjective:   Interval events noted.  He feels a little better.  He said he had pain in the left arm is bearable.  Objective:    Vitals:   09/07/20 1346 09/07/20 2213 09/08/20 0619 09/08/20 1357  BP: 112/78 100/89 106/75 99/65  Pulse: (!) 57 68 (!) 54 (!) 56  Resp: 18 17 18 18   Temp: 98.2 F (36.8 C) 98.8 F (37.1 C) 97.6 F (36.4 C)  98.2 F (36.8 C)  TempSrc: Oral Oral Oral Oral  SpO2: 94% 96% 98%   Weight:      Height:       No data found.   Intake/Output Summary (Last 24 hours) at 09/08/2020 1648 Last data filed at 09/08/2020 1400 Gross per 24 hour  Intake 960 ml  Output 0 ml  Net 960 ml   Filed Weights   09/02/20 1218  Weight: 72.6 kg    Exam:  GEN: NAD SKIN: No rash EYES: EOMI ENT: MMM CV: RRR PULM: CTA B ABD: soft, ND, NT, +BS CNS: AAO x 3, non focal EXT: No edema or tenderness         Data Reviewed:   I have personally reviewed following labs and imaging studies:  Labs: Labs show the following:   Basic Metabolic Panel: Recent Labs  Lab 09/02/20 1335 09/03/20 0332 09/04/20 0414  NA 137 135 136  K 3.6 3.7 4.0  CL 102 102 102  CO2 26 26 28   GLUCOSE 83 91 83  BUN 16 15 13   CREATININE 0.99 0.77 1.04  CALCIUM 8.6* 7.9* 8.7*  MG  --   --  1.9   GFR Estimated Creatinine Clearance: 71.8 mL/min (by C-G formula based on SCr of 1.04 mg/dL). Liver Function Tests: Recent Labs  Lab 09/02/20 1335  AST 30  ALT 21  ALKPHOS 36*  BILITOT 0.5  PROT 7.7  ALBUMIN 3.6   No results for input(s): LIPASE, AMYLASE in the last 168 hours. No results for input(s): AMMONIA in the last 168 hours. Coagulation profile No results for input(s): INR, PROTIME in the last 168 hours.  CBC: Recent Labs  Lab 09/02/20 1335 09/04/20 0414  WBC 4.8 4.1  NEUTROABS 2.3  --   HGB 13.8 12.6*  HCT 41.3 37.9*  MCV 99.3 99.0  PLT 253 203   Cardiac Enzymes: No results for input(s): CKTOTAL, CKMB, CKMBINDEX, TROPONINI in the last 168 hours. BNP (last 3 results) No results for input(s): PROBNP in the last 8760 hours. CBG: No results for input(s): GLUCAP in the last 168 hours. D-Dimer: No results for input(s): DDIMER in the last 72 hours. Hgb A1c: No results for input(s): HGBA1C in the last 72 hours. Lipid Profile: No results for input(s): CHOL, HDL, LDLCALC, TRIG, CHOLHDL, LDLDIRECT in the last  72 hours. Thyroid function studies: No results for input(s): TSH, T4TOTAL, T3FREE, THYROIDAB in the last 72 hours.  Invalid input(s): FREET3  Anemia work up: No results for input(s): VITAMINB12, FOLATE, FERRITIN, TIBC, IRON, RETICCTPCT in the last 72 hours. Sepsis Labs: Recent Labs  Lab 09/02/20 1335 09/04/20 0414  WBC 4.8 4.1    Microbiology Recent Results (from the past 240  hour(s))  SARS CORONAVIRUS 2 (TAT 6-24 HRS) Nasopharyngeal Nasopharyngeal Swab     Status: None   Collection Time: 09/02/20  3:03 PM   Specimen: Nasopharyngeal Swab  Result Value Ref Range Status   SARS Coronavirus 2 NEGATIVE NEGATIVE Final    Comment: (NOTE) SARS-CoV-2 target nucleic acids are NOT DETECTED.  The SARS-CoV-2 RNA is generally detectable in upper and lower respiratory specimens during the acute phase of infection. Negative results do not preclude SARS-CoV-2 infection, do not rule out co-infections with other pathogens, and should not be used as the sole basis for treatment or other patient management decisions. Negative results must be combined with clinical observations, patient history, and epidemiological information. The expected result is Negative.  Fact Sheet for Patients: SugarRoll.be  Fact Sheet for Healthcare Providers: https://www.woods-mathews.com/  This test is not yet approved or cleared by the [place] FDA and  has been authorized for detection and/or diagnosis of SARS-CoV-2 by FDA under an Emergency Use Authorization (EUA). This EUA will remain  in effect (meaning this test can be used) for the duration of the COVID-19 declaration under Se ction 564(b)(1) of the Act, 21 U.S.C. section 360bbb-3(b)(1), unless the authorization is terminated or revoked sooner.  Performed at [facility], [place] [address] [place], [place] [address]     Procedures and diagnostic studies:  No results found.              LOS: 6 days   Miguel Colon  [facility]   Pager on [email or web address]. If 7PM-7AM, please contact night-coverage at [email or web address]     09/08/2020, 4:48 PM

## 2020-09-08 NOTE — Progress Notes (Signed)
IP PROGRESS NOTE  Subjective:   No new complaints reported by Miguel Colon this morning.  He underwent biopsy without any complications.  His pain is overall manageable at this time.  He denies any shortness of breath or neurological deficits.  Objective:  Vital signs in last 24 hours: Temp:  [97.6 F (36.4 C)-98.8 F (37.1 C)] 97.6 F (36.4 C) (07/11 0619) Pulse Rate:  [54-68] 54 (07/11 0619) Resp:  [17-18] 18 (07/11 0619) BP: (100-112)/(75-89) 106/75 (07/11 0619) SpO2:  [94 %-98 %] 98 % (07/11 1155) Weight change:  Last BM Date: 09/04/20  Intake/Output from previous day: 07/10 0701 - 07/11 0700 In: 840 [P.O.:840] Out: 0   General appearance: Comfortable appearing without any discomfort Head: Normocephalic without any trauma Oropharynx: Mucous membranes are moist and pink without any thrush or ulcers. Eyes: Pupils are equal and round reactive to light. Lymph nodes: No cervical, supraclavicular, inguinal or axillary lymphadenopathy.   Heart:regular rate and rhythm.  S1 and S2 without leg edema. Lung: Clear without any rhonchi or wheezes.  No dullness to percussion. Abdomin: Soft, nontender, nondistended with good bowel sounds.  No hepatosplenomegaly. Musculoskeletal: No joint deformity or effusion.  Full range of motion noted. Neurological: No deficits noted on motor, sensory and deep tendon reflex exam. Skin: No petechial rash or dryness.  Appeared moist.      Medications: I have reviewed the patient's current medications.  Assessment/Plan:   65 year old with:    1.  Left lung neoplasm currently under evaluation.  Biopsy completed and results currently ending.   Management choices were reviewed again with the patient likely will require radiation concomitantly with chemotherapy once the final pathology is available.  We will arrange a follow-up at [facility] upon his discharge.   2.  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  He  would require neurosurgical evaluation regarding this issue in the future.   3.  Arm and shoulder pain: Pain is manageable overall.  4.  Disposition: We will arrange follow-up in [place] closer to home based on his wishes.   25  minutes were spent on this visit.  50% of the time was face-to-face and dedicated to reviewing his disease status, treatment choices and complications related to therapy.    LOS: 6 days   Zola Button 09/08/2020, 11:25 AM

## 2020-09-09 ENCOUNTER — Other Ambulatory Visit: Payer: Self-pay | Admitting: Oncology

## 2020-09-09 DIAGNOSIS — C3412 Malignant neoplasm of upper lobe, left bronchus or lung: Secondary | ICD-10-CM

## 2020-09-09 DIAGNOSIS — Z515 Encounter for palliative care: Secondary | ICD-10-CM

## 2020-09-09 LAB — SURGICAL PATHOLOGY

## 2020-09-09 MED ORDER — ACETAMINOPHEN 500 MG PO TABS: 1000.0000 mg | ORAL_TABLET | Freq: Three times a day (TID) | ORAL | 0 refills | Status: DC

## 2020-09-09 MED ORDER — LIDOCAINE 5 % EX PTCH: 1.0000 | MEDICATED_PATCH | CUTANEOUS | 0 refills | Status: DC

## 2020-09-09 MED ORDER — MAGNESIUM CITRATE PO SOLN: 1.0000 | Freq: Once | ORAL | 11 refills | Status: AC

## 2020-09-09 MED ORDER — TRAZODONE HCL 50 MG PO TABS: 50.0000 mg | ORAL_TABLET | Freq: Every day | ORAL | 3 refills | Status: DC

## 2020-09-09 MED ORDER — NALOXEGOL OXALATE 25 MG PO TABS: 25.0000 mg | ORAL_TABLET | Freq: Every day | ORAL | 3 refills | Status: DC

## 2020-09-09 MED ORDER — NICOTINE 14 MG/24HR TD PT24: 14.0000 mg | MEDICATED_PATCH | Freq: Every day | TRANSDERMAL | 0 refills | Status: DC

## 2020-09-09 MED ORDER — METHADONE HCL 5 MG PO TABS: 5.0000 mg | ORAL_TABLET | Freq: Two times a day (BID) | ORAL | 0 refills | Status: DC

## 2020-09-09 MED ORDER — MELOXICAM 15 MG PO TABS: 15.0000 mg | ORAL_TABLET | Freq: Every day | ORAL | 2 refills | Status: DC

## 2020-09-09 NOTE — TOC Transition Note (Signed)
Transition of Care [facility]) - CM/SW Discharge Note  Patient Details  Name: Miguel Colon MRN: [id number] Date of Birth: 23-Oct-1955  Transition of Care [facility]) CM/SW Contact:  Sherie Don, LCSW Phone Number: 09/09/2020, 11:04 AM  Clinical Narrative: Patient in need of transportation home. Patient signed rider waiver, which was sent to [employer]. Transportation to be called when patient is ready for discharge. TOC signing off.  Final next level of care: Home/Self Care Barriers to Discharge: Barriers Resolved  Patient Goals and CMS Choice CMS Medicare.gov Compare Post Acute Care list provided to:: Patient Choice offered to / list presented to : NA  Discharge Plan and Services         DME Arranged: N/A DME Agency: NA  Readmission Risk Interventions No flowsheet data found.

## 2020-09-09 NOTE — Progress Notes (Unsigned)
Palliative Medicine RN Note: Sent in prior auth information for Lidoderm patches. [id number] - Rx #: [id number]. Waiting on authorization; sent requesting expedited review.  Marjie Skiff Marjarie Irion, RN, BSN, [facility] Palliative Medicine Team 09/09/2020 12:28 PM Office [phone]    Rec'd immediate approval:  Miguel Colon (Key: [id number]) - [id number] Lidocaine 5% patches     Status: PA Response - Approved  Created: July 12th, 2022 [phone]  Sent: July 12th, 2022  Marjie Skiff. Stryker Veasey, RN, BSN, [facility] Palliative Medicine Team 09/09/2020 12:30 PM Office [phone]

## 2020-09-09 NOTE — Progress Notes (Signed)
Brief oncology note:  Outpatient appointment has been scheduled at the [facility] on 09/17/2020 at 8:30 AM.  Schedule printed on AVS.  Mikey Bussing, DNP, AGPCNP-BC, AOCNP

## 2020-09-09 NOTE — Discharge Summary (Signed)
Physician Discharge Summary  Miguel Colon BTD:[id number] DOB: June 13, 1955 DOA: 09/02/2020  PCP: [name], MD  Admit date: 09/02/2020 Discharge date: 09/09/2020  Discharge disposition: Home   Recommendations for Outpatient Follow-Up:   Follow up at the cancer center at [facility] in [place].   Discharge Diagnosis:   Active Problems:   Chronic back pain   Hypercholesteremia   Hypothyroidism   Pancoast syndrome, left [facility])   Palliative care patient    Discharge Condition: Stable.  Diet recommendation:  Diet Order             Diet - low sodium heart healthy           Diet regular Room service appropriate? Yes; Fluid consistency: Thin  Diet effective now                     Code Status: Full Code     Hospital Course:   Mr.  Miguel Colon is a 65 y.o. male  with medical history significant of asthma, hypothyroidism, hyperlipidemia who was diagnosed with left lung apex on June 6, '22. He has not yet had follow up. He has been having increasing numbness and pain in the left upper extremity.   He was admitted to the hospital for pain control.  Oncologist and neurosurgeon were consulted.  Case was discussed with Dr. Christella Noa, neurosurgeon, who said that there was no indication for urgent surgical intervention.  He recommended management from oncologist.   Patient underwent CT-guided lung biopsy.  Pathology report showed poorly differentiated carcinoma.  Palliative care team was consulted to assist with pain control.  Outpatient follow-up with oncologist was strongly recommended.      Medical Consultants:   Palliative care team Oncologist   Discharge Exam:    Vitals:   09/08/20 1357 09/08/20 1959 09/08/20 2329 09/09/20 0405  BP: 99/65 105/72 112/76 99/65  Pulse: (!) 56 (!) 53 (!) 56 64  Resp: 18 15 15 15   Temp: 98.2 F (36.8 C) 98.2 F (36.8 C) 97.8 F (36.6 C) 98.2 F (36.8 C)  TempSrc: Oral Oral Oral Oral  SpO2:  95% 94% 94%  Weight:       Height:         GEN: NAD SKIN: Warm and dry EYES: No pallor or icterus ENT: MMM CV: RRR PULM: CTA B ABD: soft, ND, NT, +BS CNS: AAO x 3, non focal EXT: No edema or tenderness   The results of significant diagnostics from this hospitalization (including imaging, microbiology, ancillary and laboratory) are listed below for reference.     Procedures and Diagnostic Studies:   MR BRAIN WO CONTRAST  Result Date: 09/02/2020 CLINICAL DATA:  Left arm numbness. EXAM: MRI HEAD WITHOUT CONTRAST TECHNIQUE: Multiplanar, multiecho pulse sequences of the brain and surrounding structures were obtained without intravenous contrast. COMPARISON:  Head CT 12/17/2014 FINDINGS: Brain: There is no evidence of an acute infarct, intracranial hemorrhage, mass, midline shift, or extra-axial fluid collection. Small T2 hyperintensities in the cerebral white matter bilaterally are nonspecific but compatible with mild chronic small vessel ischemic disease. The ventricles and sulci are within normal limits for age. Vascular: Major intracranial vascular flow voids are preserved. Skull and upper cervical spine: Unremarkable bone marrow signal. Sinuses/Orbits: Unremarkable orbits. Paranasal sinuses and mastoid air cells are clear. Other: None. IMPRESSION: 1. No acute intracranial abnormality. 2. Mild chronic small vessel ischemic disease. Electronically Signed   By: Logan Bores M.D.   On: 09/02/2020 14:30   MR  Cervical Spine Wo Contrast  Result Date: 09/02/2020 CLINICAL DATA:  Left arm numbness. EXAM: MRI CERVICAL SPINE WITHOUT CONTRAST TECHNIQUE: Multiplanar, multisequence MR imaging of the cervical spine was performed. No intravenous contrast was administered. COMPARISON:  Chest CTA 08/04/2020 FINDINGS: The study is motion degraded including severe motion on axial T1 and T2 gradient echo sequences. Alignment: Trace retrolisthesis of C3 on C4. Straightening of the normal cervical lordosis. Vertebrae: No fracture or  suspicious marrow lesion. Degenerative endplate changes at L8-9 including mild degenerative edema associated with moderate disc space narrowing. Cord: Abnormal T2 hyperintensity in the cord bilaterally at C3-4. Posterior Fossa, vertebral arteries, paraspinal tissues: Partial imaging of known left apical lung mass which invades into the soft tissues superior and posterior to the lung apex including the paraspinal soft tissues at T1-2. unremarkable included posterior fossa. Disc levels: C2-3: Disc bulging and right uncovertebral spurring result in mild right neural foraminal stenosis without significant spinal stenosis. C3-4: Disc bulging and uncovertebral spurring result in severe spinal stenosis with severe cord flattening and severe bilateral neural foraminal stenosis. C4-5: Disc bulging, uncovertebral spurring, and mild facet arthrosis result in moderate right neural foraminal stenosis without significant spinal stenosis. C5-6: Mild disc bulging and left facet arthrosis without stenosis. C6-7: Negative. C7-T1: Negative. IMPRESSION: 1. Motion degraded examination. 2. Partial imaging of known invasive left apical lung mass which likely involves the left brachial plexus. 3. Cervical disc degeneration greatest at C3-4 where there is severe spinal stenosis and cord signal abnormality suggesting spondylotic myelopathy. 4. Severe bilateral neural foraminal stenosis at C3-4. Electronically Signed   By: Logan Bores M.D.   On: 09/02/2020 14:46     Labs:   Basic Metabolic Panel: Recent Labs  Lab 09/02/20 1335 09/03/20 0332 09/04/20 0414  NA 137 135 136  K 3.6 3.7 4.0  CL 102 102 102  CO2 26 26 28   GLUCOSE 83 91 83  BUN 16 15 13   CREATININE 0.99 0.77 1.04  CALCIUM 8.6* 7.9* 8.7*  MG  --   --  1.9   GFR Estimated Creatinine Clearance: 71.8 mL/min (by C-G formula based on SCr of 1.04 mg/dL). Liver Function Tests: Recent Labs  Lab 09/02/20 1335  AST 30  ALT 21  ALKPHOS 36*  BILITOT 0.5  PROT 7.7   ALBUMIN 3.6   No results for input(s): LIPASE, AMYLASE in the last 168 hours. No results for input(s): AMMONIA in the last 168 hours. Coagulation profile No results for input(s): INR, PROTIME in the last 168 hours.  CBC: Recent Labs  Lab 09/02/20 1335 09/04/20 0414  WBC 4.8 4.1  NEUTROABS 2.3  --   HGB 13.8 12.6*  HCT 41.3 37.9*  MCV 99.3 99.0  PLT 253 203   Cardiac Enzymes: No results for input(s): CKTOTAL, CKMB, CKMBINDEX, TROPONINI in the last 168 hours. BNP: Invalid input(s): POCBNP CBG: No results for input(s): GLUCAP in the last 168 hours. D-Dimer No results for input(s): DDIMER in the last 72 hours. Hgb A1c No results for input(s): HGBA1C in the last 72 hours. Lipid Profile No results for input(s): CHOL, HDL, LDLCALC, TRIG, CHOLHDL, LDLDIRECT in the last 72 hours. Thyroid function studies No results for input(s): TSH, T4TOTAL, T3FREE, THYROIDAB in the last 72 hours.  Invalid input(s): FREET3 Anemia work up No results for input(s): VITAMINB12, FOLATE, FERRITIN, TIBC, IRON, RETICCTPCT in the last 72 hours. Microbiology Recent Results (from the past 240 hour(s))  SARS CORONAVIRUS 2 (TAT 6-24 HRS) Nasopharyngeal Nasopharyngeal Swab     Status:  None   Collection Time: 09/02/20  3:03 PM   Specimen: Nasopharyngeal Swab  Result Value Ref Range Status   SARS Coronavirus 2 NEGATIVE NEGATIVE Final    Comment: (NOTE) SARS-CoV-2 target nucleic acids are NOT DETECTED.  The SARS-CoV-2 RNA is generally detectable in upper and lower respiratory specimens during the acute phase of infection. Negative results do not preclude SARS-CoV-2 infection, do not rule out co-infections with other pathogens, and should not be used as the sole basis for treatment or other patient management decisions. Negative results must be combined with clinical observations, patient history, and epidemiological information. The expected result is Negative.  Fact Sheet for  Patients: SugarRoll.be  Fact Sheet for Healthcare Providers: https://www.woods-mathews.com/  This test is not yet approved or cleared by the [place] FDA and  has been authorized for detection and/or diagnosis of SARS-CoV-2 by FDA under an Emergency Use Authorization (EUA). This EUA will remain  in effect (meaning this test can be used) for the duration of the COVID-19 declaration under Se ction 564(b)(1) of the Act, 21 U.S.C. section 360bbb-3(b)(1), unless the authorization is terminated or revoked sooner.  Performed at [facility], [place] [address], [place], [place] [address]      Discharge Instructions:   Discharge Instructions     Diet - low sodium heart healthy   Complete by: As directed    Increase activity slowly   Complete by: As directed    No wound care   Complete by: As directed       Allergies as of 09/09/2020   No Known Allergies      Medication List     STOP taking these medications    naproxen sodium 220 MG tablet Commonly known as: ALEVE   polyethylene glycol-electrolytes 420 g solution Commonly known as: TriLyte       TAKE these medications    acetaminophen 500 MG tablet Commonly known as: TYLENOL Take 2 tablets (1,000 mg total) by mouth 3 (three) times daily.   atorvastatin 20 MG tablet Commonly known as: LIPITOR Take 20 mg by mouth daily at 6 PM.   levothyroxine 200 MCG tablet Commonly known as: SYNTHROID Take 200 mcg by mouth daily before breakfast.   lidocaine 5 % Commonly known as: LIDODERM Place 1 patch onto the skin daily. Remove & Discard patch within 12 hours or as directed by MD   magnesium citrate Soln Take 296 mLs (1 Bottle total) by mouth once for 1 dose.   meloxicam 15 MG tablet Commonly known as: MOBIC Take 1 tablet (15 mg total) by mouth daily.   methadone 5 MG tablet Commonly known as: DOLOPHINE Take 1 tablet (5 mg total) by mouth every 12 (twelve) hours.    naloxegol oxalate 25 MG Tabs tablet Commonly known as: Movantik Take 1 tablet (25 mg total) by mouth daily.   nicotine 14 mg/24hr patch Commonly known as: NICODERM CQ - dosed in mg/24 hours Place 1 patch (14 mg total) onto the skin daily.   traZODone 50 MG tablet Commonly known as: DESYREL Take 1 tablet (50 mg total) by mouth at bedtime.        Follow-up Information     [facility]. Schedule an appointment as soon as possible for a visit in 3 day(s).   Specialty: Oncology Contact information: [address] [id number] Prudy Feeler [place] [address] [place], DO Follow up in 1 month(s).   Specialty:  Internal Medicine Why: As needed for pain and symptom management Contact information: [address] [place] [address] [phone]                  Time coordinating discharge: 28 minutes  Signed:  Jennye Boroughs  [facility] 09/09/2020, 11:39 AM   Pager on [email or web address]. If 7PM-7AM, please contact night-coverage at [email or web address]

## 2020-09-09 NOTE — Plan of Care (Signed)
Instructions were reviewed with patient. All questions were answered. Patient was transported to main entrance by wheelchair. ° °

## 2020-09-11 ENCOUNTER — Other Ambulatory Visit: Payer: Self-pay | Admitting: *Deleted

## 2020-09-11 NOTE — Progress Notes (Signed)
The proposed treatment discussed in cancer conference is for discussion purpose only and is not a binding recommendation. The patient was not physically examined nor present for their treatment options. Therefore, final treatment plans cannot be decided.  ?

## 2020-09-16 ENCOUNTER — Encounter (HOSPITAL_COMMUNITY): Payer: Self-pay | Admitting: Surgery

## 2020-09-16 ENCOUNTER — Other Ambulatory Visit: Payer: Self-pay

## 2020-09-17 ENCOUNTER — Encounter: Payer: Self-pay | Admitting: Hematology and Oncology

## 2020-09-17 ENCOUNTER — Inpatient Hospital Stay (HOSPITAL_COMMUNITY): Payer: Medicare Other | Attending: Hematology and Oncology | Admitting: Hematology and Oncology

## 2020-09-17 ENCOUNTER — Encounter (HOSPITAL_COMMUNITY): Payer: Self-pay | Admitting: Hematology and Oncology

## 2020-09-17 VITALS — BP 94/67 | HR 76 | Temp 96.4°F | Resp 16 | Ht 69.0 in | Wt 143.7 lb

## 2020-09-17 DIAGNOSIS — F17210 Nicotine dependence, cigarettes, uncomplicated: Secondary | ICD-10-CM

## 2020-09-17 DIAGNOSIS — G893 Neoplasm related pain (acute) (chronic): Secondary | ICD-10-CM

## 2020-09-17 DIAGNOSIS — C3490 Malignant neoplasm of unspecified part of unspecified bronchus or lung: Secondary | ICD-10-CM

## 2020-09-17 DIAGNOSIS — R64 Cachexia: Secondary | ICD-10-CM | POA: Diagnosis not present

## 2020-09-17 DIAGNOSIS — C3492 Malignant neoplasm of unspecified part of left bronchus or lung: Secondary | ICD-10-CM

## 2020-09-17 DIAGNOSIS — K5909 Other constipation: Secondary | ICD-10-CM

## 2020-09-17 MED ORDER — PROCHLORPERAZINE MALEATE 10 MG PO TABS: 10.0000 mg | ORAL_TABLET | Freq: Four times a day (QID) | ORAL | 1 refills | Status: DC | PRN

## 2020-09-17 MED ORDER — POLYETHYLENE GLYCOL 3350 17 G PO PACK: 17.0000 g | PACK | Freq: Every day | ORAL | 2 refills | Status: DC

## 2020-09-17 MED ORDER — LIDOCAINE-PRILOCAINE 2.5-2.5 % EX CREA: TOPICAL_CREAM | CUTANEOUS | 3 refills | Status: DC

## 2020-09-17 MED ORDER — ONDANSETRON HCL 8 MG PO TABS: 8.0000 mg | ORAL_TABLET | Freq: Two times a day (BID) | ORAL | 1 refills | Status: DC | PRN

## 2020-09-17 MED ORDER — SENNA 8.6 MG PO TABS: 2.0000 | ORAL_TABLET | Freq: Two times a day (BID) | ORAL | 0 refills | Status: DC

## 2020-09-17 MED ORDER — METHADONE HCL 10 MG PO TABS: 10.0000 mg | ORAL_TABLET | Freq: Two times a day (BID) | ORAL | 0 refills | Status: DC

## 2020-09-17 NOTE — Assessment & Plan Note (Signed)
I have personally reviewed his imaging studies and pathology report I informed the patient he has small cell lung cancer, likely caused by his smoking The patient will be able to successfully quit smoking by the end of the month I recommend PET CT scan for staging He had negative MRI of his brain I told the patient due to the location of the disease, this is not resectable The contralateral right upper lobe lung nodule is calcified, could be benign in nature Overall, I felt this is most consistent with limited stage small cell lung cancer, potentially is highly treatable with concurrent chemoradiation therapy I recommend port placement and chemo education class We discussed referral to radiation oncology department and he chose to be treated at [facility] for radiation portion of his treatment but will remain here for chemotherapy I recommend concurrent chemoradiation therapy with cisplatin and etoposide I plan to start him on treatment as soon as possible early August regardless of the start date of radiation treatment I am hopeful his symptomatic pain will resolve in time when we get him started on treatment

## 2020-09-17 NOTE — Assessment & Plan Note (Signed)
He has recent severe constipation, caused by severe hypothyroidism as well as his narcotic prescription I recommend MiraLAX daily along with Senokot I will get my nursing staff to call him tomorrow to check on him

## 2020-09-17 NOTE — Progress Notes (Signed)
[name] FOLLOW-UP progress notes  Patient Care Team: [name], MD as PCP - General (Internal Medicine) Gala Romney, Cristopher Estimable, MD as Consulting Physician (Gastroenterology)  CHIEF COMPLAINTS/PURPOSE OF VISIT:  Small cell lung cancer, presented with Pancoast tumor, for further management  HISTORY OF PRESENTING ILLNESS:  Miguel Colon 65 y.o. male was transferred to my care after recent discharge from the hospital The patient has significant smoking history He started to complain of left arm weakness and pain, radiating down to the left side of his arm In the course of evaluation, he was found to have mass on the left upper lobe He was discharged from the emergency room in June with pain medicine and follow-up with primary care doctor He presented again to the emergency department a month later with similar complaints and underwent further evaluation and was admitted to the hospital He was seen by Dr. Alen Blew, consulting oncology who recommended further evaluation, leading to CT-guided biopsy of the mass Pathology was pending at the time of discharge On review of his test results, final pathology confirmed small cell lung cancer He noted some eye lid droopiness affecting the left side of his face. He rated his left upper arm pain as 8 out of 10 with mild swelling He stated the methadone is not sufficient to control his pain He is profoundly constipated, last bowel movement was 6 days ago He has attempted to quit smoking, down to 2 cigarettes yesterday and he is confident he can quit smoking by the end of the month He has lost approximately 15 pounds of weight over the past 2 months  I reviewed the patient's records extensive and collaborated the history with the patient. Summary of his history is as follows: Oncology History  Small cell lung cancer, left ([place])  08/04/2020 Imaging   No evidence of thoracic aortic aneurysm or dissection.   5.5 cm mass at the left lung apex,  suspicious for primary bronchogenic neoplasm/Pancoast tumor.   No findings specific for metastatic disease.   Aortic Atherosclerosis (ICD10-I70.0) and Emphysema (ICD10-J43.9).   09/02/2020 Imaging   MRI brain IMPRESSION: 1. No acute intracranial abnormality. 2. Mild chronic small vessel ischemic disease.   09/04/2020 Imaging   CT abdomen and pelvis 1. Left lung base nodule, likely metastatic. 2. No metastatic disease demonstrated in the abdomen or pelvis. 3. Diffusely stool-filled colon with prominent stool filled rectum, likely constipation.   09/05/2020 Pathology Results   FINAL MICROSCOPIC DIAGNOSIS:   A. LUNG, LUL, BIOPSY:  - Poorly differentiated carcinoma.  - See comment.   COMMENT:   The findings favor poorly differentiated non-small cell carcinoma.  Immunohistochemistry will be performed and reported as an addendum.   ADDENDUM:   By immunohistochemistry, the neoplastic cells are positive for TTF-1, synaptophysin and chromogranin (dot-like positivity) but negative for cytokeratin 5/6, cytokeratin 7, CD56, p40, and p63.  Overall the  morphology and immunophenotype, are consistent with a small cell carcinoma.       09/17/2020 Initial Diagnosis   Small cell lung cancer, left ([place])   09/17/2020 Cancer Staging   Staging form: Lung, AJCC 8th Edition - Clinical stage from 09/17/2020: Stage IIB (cT3, cN0, cM0) - Signed by Heath Lark, MD on 09/17/2020  Stage prefix: Initial diagnosis    09/29/2020 -  Chemotherapy    Patient is on Treatment Plan: LUNG SMALL CELL CISPLATIN D1 + ETOPOSIDE D1-3 Q21D         MEDICAL HISTORY:  Past Medical History:  Diagnosis Date  Asthma    Back pain    Lung cancer ([place])    Migraines    Thyroid disease     SURGICAL HISTORY: Past Surgical History:  Procedure Laterality Date   THYROIDECTOMY      SOCIAL HISTORY: Social History   Socioeconomic History   Marital status: Legally Separated    Spouse name: Not on file   Number of  children: 3   Years of education: Not on file   Highest education level: Not on file  Occupational History   Occupation: Retired  Tobacco Use   Smoking status: Former    Packs/day: 0.50    Years: 47.00    Pack years: 23.50    Types: Cigarettes    Quit date: 09/02/2020    Years since quitting: 0.0   Smokeless tobacco: Never  Vaping Use   Vaping Use: Never used  Substance and Sexual Activity   Alcohol use: No   Drug use: No   Sexual activity: Not Currently  Other Topics Concern   Not on file  Social History Narrative   Not on file   Social Determinants of Health   Financial Resource Strain: Medium Risk   Difficulty of Paying Living Expenses: Somewhat hard  Food Insecurity: No Food Insecurity   Worried About Charity fundraiser in the Last Year: Never true   Ran Out of Food in the Last Year: Never true  Transportation Needs: Unmet Transportation Needs   Lack of Transportation (Medical): Yes   Lack of Transportation (Non-Medical): Yes  Physical Activity: Insufficiently Active   Days of Exercise per Week: 5 days   Minutes of Exercise per Session: 20 min  Stress: No Stress Concern Present   Feeling of Stress : Not at all  Social Connections: Socially Isolated   Frequency of Communication with Friends and Family: Three times a week   Frequency of Social Gatherings with Friends and Family: Three times a week   Attends Religious Services: Never   Active Member of Clubs or Organizations: No   Attends Archivist Meetings: Never   Marital Status: Separated  Intimate Partner Violence: Not At Risk   Fear of Current or Ex-Partner: No   Emotionally Abused: No   Physically Abused: No   Sexually Abused: No    FAMILY HISTORY: Family History  Problem Relation Age of Onset   Hypertension Mother    Hypertension Father    Colon cancer Father 5    ALLERGIES:  has No Known Allergies.  MEDICATIONS:  Current Outpatient Medications  Medication Sig Dispense Refill    polyethylene glycol (MIRALAX) 17 g packet Take 17 g by mouth daily. 30 each 2   senna (SENOKOT) 8.6 MG TABS tablet Take 2 tablets (17.2 mg total) by mouth 2 (two) times daily. 120 tablet 0   acetaminophen (TYLENOL) 500 MG tablet Take 2 tablets (1,000 mg total) by mouth 3 (three) times daily. 30 tablet 0   levothyroxine (SYNTHROID) 200 MCG tablet Take 200 mcg by mouth daily before breakfast.     lidocaine (LIDODERM) 5 % Place 1 patch onto the skin daily. Remove & Discard patch within 12 hours or as directed by MD 30 patch 0   lidocaine-prilocaine (EMLA) cream Apply to affected area once 30 g 3   meloxicam (MOBIC) 15 MG tablet Take 1 tablet (15 mg total) by mouth daily. 30 tablet 2   methadone (DOLOPHINE) 10 MG tablet Take 1 tablet (10 mg total) by mouth every 12 (twelve) hours. [place]  tablet 0   naloxegol oxalate (MOVANTIK) 25 MG TABS tablet Take 1 tablet (25 mg total) by mouth daily. 30 tablet 3   nicotine (NICODERM CQ - DOSED IN MG/24 HOURS) 14 mg/24hr patch Place 1 patch (14 mg total) onto the skin daily. 28 patch 0   ondansetron (ZOFRAN) 8 MG tablet Take 1 tablet (8 mg total) by mouth 2 (two) times daily as needed. Start on the third day after cisplatin chemotherapy. 30 tablet 1   prochlorperazine (COMPAZINE) 10 MG tablet Take 1 tablet (10 mg total) by mouth every 6 (six) hours as needed (Nausea or vomiting). 30 tablet 1   traZODone (DESYREL) 50 MG tablet Take 1 tablet (50 mg total) by mouth at bedtime. 30 tablet 3   No current facility-administered medications for this visit.    REVIEW OF SYSTEMS:   Constitutional: Denies fevers, chills or abnormal night sweats Eyes: Denies blurriness of vision, double vision or watery eyes Ears, nose, mouth, throat, and face: Denies mucositis or sore throat Respiratory: Denies cough, dyspnea or wheezes Cardiovascular: Denies palpitation, chest discomfort or lower extremity swelling Skin: Denies abnormal skin rashes Lymphatics: Denies new lymphadenopathy or  easy bruising Neurological:Denies numbness, tingling or new weaknesses Behavioral/Psych: Mood is stable, no new changes  All other systems were reviewed with the patient and are negative.  PHYSICAL EXAMINATION: ECOG PERFORMANCE STATUS: 1 - Symptomatic but completely ambulatory  Vitals:   09/17/20 0836  BP: 94/67  Pulse: 76  Resp: 16  Temp: (!) 96.4 F (35.8 C)  SpO2: 98%   Filed Weights   09/17/20 0836  Weight: 143 lb 11.2 oz (65.2 kg)    GENERAL:alert, no distress and comfortable.  He looks thin SKIN: skin color, texture, turgor are normal, no rashes or significant lesions EYES: normal, conjunctiva are pink and non-injected, sclera clear OROPHARYNX:no exudate, normal lips, buccal mucosa, and tongue  NECK: supple, thyroid normal size, non-tender, without nodularity LYMPH:  no palpable lymphadenopathy in the cervical, axillary or inguinal LUNGS: clear to auscultation and percussion with normal breathing effort HEART: regular rate & rhythm and no murmurs without lower extremity edema ABDOMEN:abdomen soft, non-tender and normal bowel sounds Musculoskeletal:no cyanosis of digits and no clubbing  PSYCH: alert & oriented x 3 with fluent speech NEURO: no focal motor weakness.  Noted droopiness of the left eyelid  LABORATORY DATA:  I have reviewed the data as listed Lab Results  Component Value Date   WBC 4.1 09/04/2020   HGB 12.6 (L) 09/04/2020   HCT 37.9 (L) 09/04/2020   MCV 99.0 09/04/2020   PLT 203 09/04/2020   Recent Labs    08/04/20 1325 09/02/20 1335 09/03/20 0332 09/04/20 0414  NA 134* 137 135 136  K 3.9 3.6 3.7 4.0  CL 101 102 102 102  CO2 28 26 26 28   GLUCOSE 86 83 91 83  BUN 15 16 15 13   CREATININE 1.10 0.99 0.77 1.04  CALCIUM 8.7* 8.6* 7.9* 8.7*  GFRNONAA >60 >60 >60 >60  PROT 6.9 7.7  --   --   ALBUMIN 3.1* 3.6  --   --   AST 22 30  --   --   ALT 15 21  --   --   ALKPHOS 40 36*  --   --   BILITOT 0.3 0.5  --   --     RADIOGRAPHIC STUDIES: I  have personally reviewed the radiological images as listed and agreed with the findings in the report. MR BRAIN WO CONTRAST  Result Date: 09/02/2020 CLINICAL DATA:  Left arm numbness. EXAM: MRI HEAD WITHOUT CONTRAST TECHNIQUE: Multiplanar, multiecho pulse sequences of the brain and surrounding structures were obtained without intravenous contrast. COMPARISON:  Head CT 12/17/2014 FINDINGS: Brain: There is no evidence of an acute infarct, intracranial hemorrhage, mass, midline shift, or extra-axial fluid collection. Small T2 hyperintensities in the cerebral white matter bilaterally are nonspecific but compatible with mild chronic small vessel ischemic disease. The ventricles and sulci are within normal limits for age. Vascular: Major intracranial vascular flow voids are preserved. Skull and upper cervical spine: Unremarkable bone marrow signal. Sinuses/Orbits: Unremarkable orbits. Paranasal sinuses and mastoid air cells are clear. Other: None. IMPRESSION: 1. No acute intracranial abnormality. 2. Mild chronic small vessel ischemic disease. Electronically Signed   By: Logan Bores M.D.   On: 09/02/2020 14:30   MR Cervical Spine Wo Contrast  Result Date: 09/02/2020 CLINICAL DATA:  Left arm numbness. EXAM: MRI CERVICAL SPINE WITHOUT CONTRAST TECHNIQUE: Multiplanar, multisequence MR imaging of the cervical spine was performed. No intravenous contrast was administered. COMPARISON:  Chest CTA 08/04/2020 FINDINGS: The study is motion degraded including severe motion on axial T1 and T2 gradient echo sequences. Alignment: Trace retrolisthesis of C3 on C4. Straightening of the normal cervical lordosis. Vertebrae: No fracture or suspicious marrow lesion. Degenerative endplate changes at D6-3 including mild degenerative edema associated with moderate disc space narrowing. Cord: Abnormal T2 hyperintensity in the cord bilaterally at C3-4. Posterior Fossa, vertebral arteries, paraspinal tissues: Partial imaging of known left  apical lung mass which invades into the soft tissues superior and posterior to the lung apex including the paraspinal soft tissues at T1-2. unremarkable included posterior fossa. Disc levels: C2-3: Disc bulging and right uncovertebral spurring result in mild right neural foraminal stenosis without significant spinal stenosis. C3-4: Disc bulging and uncovertebral spurring result in severe spinal stenosis with severe cord flattening and severe bilateral neural foraminal stenosis. C4-5: Disc bulging, uncovertebral spurring, and mild facet arthrosis result in moderate right neural foraminal stenosis without significant spinal stenosis. C5-6: Mild disc bulging and left facet arthrosis without stenosis. C6-7: Negative. C7-T1: Negative. IMPRESSION: 1. Motion degraded examination. 2. Partial imaging of known invasive left apical lung mass which likely involves the left brachial plexus. 3. Cervical disc degeneration greatest at C3-4 where there is severe spinal stenosis and cord signal abnormality suggesting spondylotic myelopathy. 4. Severe bilateral neural foraminal stenosis at C3-4. Electronically Signed   By: Logan Bores M.D.   On: 09/02/2020 14:46   CT ABDOMEN PELVIS W CONTRAST  Result Date: 09/04/2020 CLINICAL DATA:  Follow-up to lung cancer diagnosis. Evaluate for metastatic disease. EXAM: CT ABDOMEN AND PELVIS WITH CONTRAST TECHNIQUE: Multidetector CT imaging of the abdomen and pelvis was performed using the standard protocol following bolus administration of intravenous contrast. CONTRAST:  119mL OMNIPAQUE IOHEXOL 300 MG/ML  SOLN COMPARISON:  CT abdomen and pelvis 11/09/2018 FINDINGS: Lower chest: Linear atelectasis or scarring in the right lung base. Circumscribed nodule in the left base measuring 1.1 cm diameter, likely metastatic. Hepatobiliary: No focal liver lesions identified. Gallbladder and bile ducts are normal. Pancreas: Unremarkable. No pancreatic ductal dilatation or surrounding inflammatory changes.  Spleen: Normal in size without focal abnormality. Adrenals/Urinary Tract: No adrenal gland nodules. Kidneys are symmetrical. Subcentimeter cysts. No solid mass. No hydronephrosis or hydroureter. Bladder is unremarkable. Stomach/Bowel: Stomach, small bowel, and colon are not abnormally distended. The colon is diffusely stool-filled with prominent stool in the rectum. Likely constipation. No wall thickening or inflammatory changes. Appendix is not identified.  Vascular/Lymphatic: Aortic atherosclerosis. No enlarged abdominal or pelvic lymph nodes. Reproductive: Prostate is unremarkable. Other: No free air or free fluid in the abdomen. Abdominal wall musculature appears intact. Musculoskeletal: Mild degenerative changes in the spine. No destructive or expansile bone lesions are appreciated. There a few scattered subcentimeter foci of sclerosis, likely representing bone islands. IMPRESSION: 1. Left lung base nodule, likely metastatic. 2. No metastatic disease demonstrated in the abdomen or pelvis. 3. Diffusely stool-filled colon with prominent stool filled rectum, likely constipation. Electronically Signed   By: Lucienne Capers M.D.   On: 09/04/2020 19:12   CT LUNG MASS BIOPSY  Result Date: 09/05/2020 Arne Cleveland, MD     09/05/2020  1:52 PM Procedure: CT core biopsy LUL lung mass  EBL:   minimal Complications:  none immediate See full dictation in [employer]. Dillard Cannon MD Main # [phone] Pager  [phone]    ASSESSMENT & PLAN:  Small cell lung cancer, left ([place]) I have personally reviewed his imaging studies and pathology report I informed the patient he has small cell lung cancer, likely caused by his smoking The patient will be able to successfully quit smoking by the end of the month I recommend PET CT scan for staging He had negative MRI of his brain I told the patient due to the location of the disease, this is not resectable The contralateral right upper lobe lung nodule is calcified,  could be benign in nature Overall, I felt this is most consistent with limited stage small cell lung cancer, potentially is highly treatable with concurrent chemoradiation therapy I recommend port placement and chemo education class We discussed referral to radiation oncology department and he chose to be treated at [facility] for radiation portion of his treatment but will remain here for chemotherapy I recommend concurrent chemoradiation therapy with cisplatin and etoposide I plan to start him on treatment as soon as possible early August regardless of the start date of radiation treatment I am hopeful his symptomatic pain will resolve in time when we get him started on treatment  Cancer associated pain His cancer pain is suboptimally controlled I recommend increasing methadone to 10 mg twice daily I informed the patient the importance of taking laxative daily to avoid severe constipation  Other constipation He has recent severe constipation, caused by severe hypothyroidism as well as his narcotic prescription I recommend MiraLAX daily along with Senokot I will get my nursing staff to call him tomorrow to check on him  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    Answer:   [facility]   NM PET Image Initial (PI) Skull Base To Thigh    Standing Status:   Future    Standing Expiration Date:   09/17/2021    Order Specific Question:   If indicated for the ordered procedure, I authorize the administration of a  radiopharmaceutical per Radiology protocol    Answer:   Yes  Order Specific Question:   Preferred imaging location?    Answer:   [facility]   CBC with Differential    Standing Status:   Standing    Number of Occurrences:   20    Standing Expiration Date:   09/17/2021   Comprehensive metabolic panel    Standing Status:   Standing    Number of Occurrences:   20    Standing Expiration Date:   09/17/2021   Magnesium    Standing Status:   Standing    Number of Occurrences:   20    Standing Expiration Date:   09/17/2021   Ambulatory referral to Radiation Oncology    Referral Priority:   Routine    Referral Type:   Consultation    Referral Reason:   Specialty Services Required    Requested Specialty:   Radiation Oncology    Number of Visits Requested:   1    All questions were answered. The patient knows to call the clinic with any problems, questions or concerns. The total time spent in the appointment was 90 minutes encounter with patients including review of chart and various tests results, discussions about plan of care and coordination of care plan   Heath Lark, MD 09/17/2020 9:15 AM

## 2020-09-17 NOTE — Progress Notes (Signed)
START ON PATHWAY REGIMEN - Small Cell Lung     A cycle is every 21 days:     Etoposide      Cisplatin   **Always confirm dose/schedule in your pharmacy ordering system**  Patient Characteristics: Newly Diagnosed, Preoperative or Nonsurgical Candidate (Clinical Staging), First Line, Limited Stage, Nonsurgical Candidate Therapeutic Status: Newly Diagnosed, Preoperative or Nonsurgical Candidate (Clinical Staging) AJCC T Category: cT3 AJCC N Category: cN0 AJCC M Category: cM0 AJCC 8 Stage Grouping: IIB Stage Classification: Limited Surgical Candidacy: Nonsurgical Candidate Intent of Therapy: Curative Intent, Discussed with Patient

## 2020-09-17 NOTE — Assessment & Plan Note (Signed)
His cancer pain is suboptimally controlled I recommend increasing methadone to 10 mg twice daily I informed the patient the importance of taking laxative daily to avoid severe constipation

## 2020-09-17 NOTE — Assessment & Plan Note (Signed)
He has lost weight due to his untreated cancer He has preserved appetite I recommend frequent small meals We will get dietitian consult

## 2020-09-18 ENCOUNTER — Telehealth: Payer: Self-pay

## 2020-09-18 NOTE — Telephone Encounter (Signed)
-----   Message from Heath Lark, MD sent at 09/18/2020  8:05 AM EDT ----- Can you call and ask if he has bowel movement yet?

## 2020-09-18 NOTE — Telephone Encounter (Signed)
Called and given below message. He is taking laxatives as instructed. No bm so far. He feels like he is getting ready to have bm soon. He will call back in the am to given update.

## 2020-09-19 ENCOUNTER — Encounter (HOSPITAL_COMMUNITY): Payer: Self-pay | Admitting: Hematology and Oncology

## 2020-09-19 NOTE — Progress Notes (Signed)
Location of tumor and Histology per Pathology Report: left small cell lung cancer  Biopsy:  09/05/2020 - CT core biopsy LUL lung mass     Past/Anticipated interventions by surgeon, if any: due to the location of the disease, this is not resectable  Past/Anticipated interventions by medical oncology, if any:  09/29/2020 - Chemotherapy     Patient is on Treatment Plan: LUNG SMALL CELL CISPLATIN D1 + ETOPOSIDE D1-3 Q21D      Pain issues, if any:  yes, left shoulder down to left wrist constant, sharp, stabbing, and aching  SAFETY ISSUES: Prior radiation? no Pacemaker/ICD? no Possible current pregnancy? no Is the patient on methotrexate? no  Current Complaints / other details:  none    Vitals:   10/15/20 1411  BP: 108/76  Pulse: 60  Resp: 18  Temp: 98.1 F (36.7 C)  SpO2: 98%  Weight: 149 lb 4.8 oz (67.7 kg)  Height: 5\' 10"  (1.778 m)

## 2020-09-22 ENCOUNTER — Other Ambulatory Visit (HOSPITAL_COMMUNITY): Payer: Self-pay

## 2020-09-22 ENCOUNTER — Other Ambulatory Visit: Payer: Self-pay | Admitting: Radiology

## 2020-09-22 DIAGNOSIS — C3412 Malignant neoplasm of upper lobe, left bronchus or lung: Secondary | ICD-10-CM

## 2020-09-22 DIAGNOSIS — C3492 Malignant neoplasm of unspecified part of left bronchus or lung: Secondary | ICD-10-CM

## 2020-09-22 DIAGNOSIS — C3490 Malignant neoplasm of unspecified part of unspecified bronchus or lung: Secondary | ICD-10-CM

## 2020-09-23 ENCOUNTER — Other Ambulatory Visit: Payer: Self-pay

## 2020-09-23 ENCOUNTER — Ambulatory Visit (HOSPITAL_COMMUNITY)
Admission: RE | Admit: 2020-09-23 | Discharge: 2020-09-23 | Disposition: A | Discharge status: Home / Self Care | Payer: Medicare Other | Source: Ambulatory Visit | Attending: Hematology and Oncology | Admitting: Hematology and Oncology

## 2020-09-23 ENCOUNTER — Inpatient Hospital Stay (HOSPITAL_COMMUNITY): Payer: Medicare Other

## 2020-09-23 DIAGNOSIS — C3492 Malignant neoplasm of unspecified part of left bronchus or lung: Secondary | ICD-10-CM

## 2020-09-23 MED ORDER — NITROGLYCERIN 1 MG/10 ML FOR IR/CATH LAB
INTRA_ARTERIAL | Status: AC
  Filled 2020-09-23: qty 10

## 2020-09-23 MED ORDER — SODIUM CHLORIDE 0.9 % IV SOLN: INTRAVENOUS | Status: DC

## 2020-09-23 MED ORDER — HEPARIN SODIUM (PORCINE) 1000 UNIT/ML IJ SOLN
INTRAMUSCULAR | Status: AC
  Filled 2020-09-23: qty 1

## 2020-09-23 MED ORDER — LIDOCAINE HCL 1 % IJ SOLN
INTRAMUSCULAR | Status: AC
  Filled 2020-09-23: qty 20

## 2020-09-23 MED ORDER — VERAPAMIL HCL 2.5 MG/ML IV SOLN
INTRAVENOUS | Status: AC
  Filled 2020-09-23: qty 2

## 2020-09-23 NOTE — Progress Notes (Signed)
    New diagnosis Lung Ca Follows with Dr Alvy Bimler  Was scheduled today for [facility] - start chemo 09/29/20  Pt shares he had an Ensure at 6 am today with his meds Discussed with Dr Maryelizabeth Kaufmann---- he is willing to use single agent and numbing for placement if pt is agreeable  Talked with pt He would rather reschedule and use full sedation--- "can't take pain"  Rescheduled to 7/28 Thursday To be at [facility] 8 am NPO after MN except water with meds  Pt does have an appointment at [facility] same day for chemo teaching Pt is going to try to change for later in day or next day for that appointment.  He is agreeable and has good understanding of plan  I will send Dr Alvy Bimler this note.

## 2020-09-23 NOTE — Progress Notes (Addendum)
Pt reports that he drank an Ensure this am at 0600. Jannifer Franklin, [place] informed. Pam in to speak with Pt. Patient states "I want to do it right". Informed that the scheduler will contact him to reschedule.

## 2020-09-23 NOTE — Progress Notes (Signed)
Pt appt rescheduled d/t not NPO. Please arrive to [facility] at [facility] 09/25/20 to admitting for [facility] procedure at 1000.

## 2020-09-24 ENCOUNTER — Ambulatory Visit: Admission: RE | Admit: 2020-09-24 | Payer: Medicare Other | Source: Ambulatory Visit

## 2020-09-24 ENCOUNTER — Other Ambulatory Visit: Payer: Self-pay | Admitting: Radiology

## 2020-09-25 ENCOUNTER — Ambulatory Visit
Admission: RE | Admit: 2020-09-25 | Discharge: 2020-09-25 | Disposition: A | Discharge status: Home / Self Care | Payer: Medicare Other | Source: Ambulatory Visit | Attending: Radiation Oncology | Admitting: Radiation Oncology

## 2020-09-25 ENCOUNTER — Ambulatory Visit (HOSPITAL_COMMUNITY): Admission: RE | Admit: 2020-09-25 | Payer: Medicare Other | Source: Ambulatory Visit

## 2020-09-25 ENCOUNTER — Other Ambulatory Visit (HOSPITAL_COMMUNITY): Payer: Medicare Other

## 2020-09-25 ENCOUNTER — Inpatient Hospital Stay (HOSPITAL_COMMUNITY): Payer: Medicare Other

## 2020-09-25 ENCOUNTER — Encounter (HOSPITAL_COMMUNITY): Payer: Medicare Other | Admitting: Dietician

## 2020-09-25 ENCOUNTER — Encounter (HOSPITAL_COMMUNITY): Payer: Self-pay

## 2020-09-25 VITALS — BP 108/76 | HR 60 | Temp 98.1°F | Resp 18 | Ht 70.0 in | Wt 149.3 lb

## 2020-09-25 DIAGNOSIS — C3492 Malignant neoplasm of unspecified part of left bronchus or lung: Secondary | ICD-10-CM

## 2020-09-25 NOTE — Progress Notes (Signed)
Patient states that he will not be coming for his appointment today with Dr. Earney Hamburg due  to having a fever. States that he has taken a  home Covid test and that it was negative. Patient wish to reschedule . Will notify the scheduler  and Dr. Sondra Come.

## 2020-09-25 NOTE — Progress Notes (Signed)
Spoke with Levada Dy in registration she states that she will reschedule the patients appointment.

## 2020-09-25 NOTE — Progress Notes (Signed)
Patient had a nursing visit scheduled for 1230 and a consult with Dr. Sondra Come at 1 pm. Patient has not arrived for the appointment call both numbers listed in chart  patient did not answer and voice mail was not set up.

## 2020-09-26 ENCOUNTER — Inpatient Hospital Stay (HOSPITAL_COMMUNITY): Payer: Medicare Other

## 2020-09-26 ENCOUNTER — Other Ambulatory Visit: Payer: Self-pay | Admitting: Radiology

## 2020-09-26 ENCOUNTER — Other Ambulatory Visit: Payer: Self-pay

## 2020-09-26 NOTE — Progress Notes (Signed)

## 2020-09-26 NOTE — Patient Instructions (Addendum)
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  You will be treated in the clinic 3 days in a row every 3 weeks in conjunction with radiation therapy with a combination of chemotherapy drugs.  Those drugs are Cisplatin and Etoposide.  On Day 1 of treatment you will receive both chemotherapy drugs.  On Days 2-3 of treatment you will receive etoposide only.  The intent of treatment (if this is limited stage SCLC) is cure.  You will see the doctor regularly throughout treatment.  We will obtain blood work from you prior to every treatment and monitor your results to make sure it is safe to give your treatment. The doctor monitors your response to treatment by the way you are feeling, your blood work, and by obtaining scans periodically.  There will be wait times while you are here for treatment.  It will take about 30 minutes to 1 hour for your lab work to result.  Then there will be wait times while pharmacy mixes your medications.     Medications you will receive in the clinic prior to your chemotherapy medications:  Aloxi:  ALOXI is used in adults to help prevent nausea and vomiting that happens with certain chemotherapy drugs.  Aloxi is a long acting medication, and will remain in your system for about two days.   Emend:  This is an anti-nausea medication that is used with Aloxi to help prevent nausea and vomiting caused by chemotherapy.  Dexamethasone:  This is a steroid given prior to chemotherapy to help prevent allergic reactions; it may also help prevent and control nausea and diarrhea.     Cisplatin   About This Drug   Cisplatin is a drug used to treat cancer. This drug is given in the vein (IV).  This will take 1 hour to infuse.  With this drug you will receive 2 hours of IV fluid hydration prior to administration, and 1 hour of IV hydration after administration.  This is to help protect your kidneys.  You will have  to urinate 200 mL prior to receiving this medication.  We will give you something to measure your urine in.  Possible Side Effects   Bone marrow suppression. This is a decrease in the number of white blood cells, red blood cells, and platelets. This may raise your risk of infection, make you tired and weak, and raise your risk of bleeding.    Decreased hearing, ringing in the ear    Nausea and vomiting (throwing up)    Changes in your kidney function    Effects on the nerves called peripheral neuropathy. You may feel numbness, tingling, or pain in your hands and feet. It may be hard for you to button your clothes, open jars, or walk as usual. The effect on the nerves may get worse with more doses of the drug. These effects get better in some people after the drug is stopped but it does not get better in all people.    Hair loss. Hair loss is often temporary, although with certain medicine, hair loss can sometimes be permanent. Hair loss may happen suddenly or gradually. If you lose hair, you may lose it from your head, face, armpits, pubic area, chest, and/or legs. You may also notice your hair getting thin.   Note: Not all possible side effects are included above.   Warnings and Precautions    Hearing loss in one or both ears  may happen. While very rare, it is not known if these effects are reversible.    Blurred vision or other changes in eyesight    Severe changes in your kidney function, which can cause kidney failure.    Severe bone marrow suppression and neutropenic fever, a type of fever that can develop when you have a very low number of white blood cells which can be life-threatening.    Severe nausea and vomiting if medications to prevent these symptoms are not taken    Severe effects on the nerves    Allergic reactions, including anaphylaxis are rare but may happen in some patients and can be life-threatening Signs of allergic reaction to this drug may be swelling of the  face, feeling like your tongue or throat are swelling, trouble breathing, rash, itching, fever, chills, feeling dizzy, and/or feeling that your heart is beating in a fast or not normal way. If this happens, do not take another dose of this drug. You should get urgent medical treatment.    This drug may raise your risk of getting a second cancer, such as acute leukemia.    Skin and tissue irritation including redness, pain, warmth, or swelling at the IV site if the drug leaks out of the vein and into nearby tissue. Very rarely it may cause local tissue necrosis (tissue death). Note: Some of the side effects above are very rare. If you have concerns and/or questions, please discuss them with your medical team. Important Information    This drug may be present in the saliva, tears, sweat, urine, stool, vomit, semen, and vaginal secretions. Talk to your doctor and/or your nurse about the necessary precautions to take during this time.    Treating Side Effects    Manage tiredness by pacing your activities for the day.    Be sure to include periods of rest between energy-draining activities.  To decrease the risk of infection, wash your hands regularly.    Avoid close contact with people who have a cold, the flu, or other infections.    Take your temperature as your doctor or nurse tells you, and whenever you feel like you may have a fever.    To help decrease the risk of bleeding, use a soft toothbrush. Check with your nurse before using dental floss.    Be very careful when using knives or tools.    Use an electric shaver instead of a razor.    Drink plenty of fluids (a minimum of eight glasses per day is recommended).    If you throw up, you should drink more fluids so that you do not become dehydrated (lack of water in the body from losing too much fluid).    To help with nausea and vomiting, eat small, frequent meals instead of three large meals a day. Choose foods and drinks that are at  room temperature. Ask your nurse or doctor about other helpful tips and medicine that is available to help stop or lessen these symptoms.    If you have numbness and tingling in your hands and feet, be careful when cooking, walking, and handling sharp objects and hot liquids.    To help with hair loss, wash with a mild shampoo and avoid washing your hair every day. Avoid coloring your hair.    Avoid rubbing your scalp, pat your hair or scalp dry.    Limit your use of hair spray, electric curlers, blow dryers, and curling irons.    If you are  interested in getting a wig, talk to your nurse and they can help you get in touch with programs in your local area.    Food and Drug Interactions   There are no known interactions of cisplatin with food.    This drug may interact with other medicines. Tell your doctor and pharmacist about all the prescription and over-the-counter medicines and dietary supplements (vitamins, minerals, herbs, and others) that you are taking at this time. Also, check with your doctor or pharmacist before starting any new prescription or over-the-counter medicines, or dietary supplements to make sure that there are no interactions.   When to Call the Doctor   Call your doctor or nurse if you have any of these symptoms and/or any new or unusual symptoms:    Fever of 100.4 F (38 C) or higher    Chills    Blurred vision or other changes in eyesight    Tiredness that interferes with your daily activities  Feeling dizzy or lightheaded    Easy bleeding or bruising    Decreased or loss of hearing    Ringing in the ear    Nausea that stops you from eating or drinking and/or is not relieved by prescribed medicines    Throwing up more than 3 times a day    Decreased or very dark urine    Numbness, tingling, or pain in your hands and feet    While you are getting this drug, please tell your nurse right away if you have any pain, redness, or swelling at the site of  the IV infusion.    Signs of allergic reaction: swelling of the face, feeling like your tongue or throat are swelling, trouble breathing, rash, itching, fever, chills, feeling dizzy, and/or feeling that your heart is beating in a fast or not normal way. If this happens, call 911 for emergency care.    If you think you may be pregnant or may have impregnated your partner   Reproduction Warnings    Pregnancy warning: This drug can have harmful effects on the unborn baby. Women of childbearing potential should use effective methods of birth control during your cancer treatment and for at 14 months after stopping treatment. Men with male partners of childbearing potential should use effective methods of birth control during your cancer treatment and for 11 months after stopping treatment. Let your doctor know right away if you think you may be pregnant or may have impregnated your partner.    Breastfeeding warning: Women should not breastfeed during treatment because this drug could enter the breast milk and cause harm to a breastfeeding baby.     Fertility warning: In men and women both, this drug may affect your ability to have children in the future. Talk with your doctor or nurse if you plan to have children. Ask for information on sperm or egg banking.    Etoposide (VePesid, Etopophos)    About This Drug   Etoposide is used to treat cancer. It is given in the vein (IV) and orally (by mouth).   Possible Side Effects    Bone marrow suppression. This is a decrease in the number of white blood cells, red blood cells, and platelets. This may raise your risk of infection, make you tired and weak, and raise your risk of bleeding.    Nausea and vomiting (throwing up)    Hair loss. Hair loss is often temporary, although with certain medicine, hair loss can sometimes be permanent. Hair loss may  happen suddenly or gradually. If you lose hair, you may lose it from your head, face, armpits, pubic  area, chest, and/or legs. You may also notice your hair getting thin.   Note: Each of the side effects above was reported in 20% or greater of patients treated with etoposide. Not all possible side effects are included above.   Warnings and Precautions    Severe bone marrow suppression, which may be life-threatening.    This drug may raise your risk of getting a second cancer, such as acute leukemia.    Low blood pressure with rapid infusion of the medication    Allergic reactions, including anaphylaxis are rare but may happen in some patients. Signs of allergic reaction to this drug may be swelling of the face, feeling like your tongue or throat are swelling, trouble breathing, rash, itching, fever, chills, feeling dizzy, and/or feeling that your heart is beating in a fast or not normal way. If this happens, do not take another dose of this drug. You should get urgent medical treatment.    These side effects may be more severe if you are receiving high doses of this medication included in pre-transplant chemotherapy.    This drug may be present in the saliva, tears, sweat, urine, stool, vomit, semen, and vaginal secretions. Talk to your doctor and/or your nurse about the necessary precautions to take during this time.    Treating Side Effects    Manage tiredness by pacing your activities for the day.    Be sure to include periods of rest between energy-draining activities.    To decrease the risk of infection, wash your hands regularly.    Avoid close contact with people who have a cold, the flu, or other infections.    Take your temperature as your doctor or nurse tells you, and whenever you feel like you may have a fever.    To help decrease the risk of bleeding, use a soft toothbrush. Check with your nurse before using dental floss.    Be very careful when using knives or tools.    Use an electric shaver instead of a razor.    Drink plenty of fluids (a minimum of eight glasses  per day is recommended).    If you throw up, you should drink more fluids so that you do not become dehydrated (lack of water in the body from losing too much fluid).    To help with nausea and vomiting, eat small, frequent meals instead of three large meals a day. Choose foods and drinks that are at room temperature. Ask your nurse or doctor about other helpful tips and medicine that is available to help stop or lessen these symptoms.    To help with hair loss, wash with a mild shampoo and avoid washing your hair every day. Avoid coloring your hair.    Avoid rubbing your scalp, pat your hair or scalp dry.    Limit your use of hair spray, electric curlers, blow dryers, and curling irons.    If you are interested in getting a wig, talk to your nurse and they can help you get in touch with programs in your local area. Food and Drug Interactions  This drug may interact with grapefruit and grapefruit juice. Talk to your doctor as this could make side effects worse.     Check with your doctor or pharmacist about all other prescription medicines and over-the-counter medicines and dietary supplements (vitamins, minerals, herbs, and others) you are taking  before starting this medicine as there are known drug interactions with etoposide. Also, check with your doctor or pharmacist before starting any new prescription or over-the-counter medicines, or dietary supplements to make sure that there are no interactions.    There are known interactions of etoposide with blood thinning medicine such as warfarin. Ask your doctor what precautions you should take.   When to Call the Doctor  Call your doctor or nurse if you have any of these symptoms and/or any new or unusual symptoms:    Fever of 100.4 F (38 C) or higher    Chills    Tiredness that interferes with your daily activities  Feeling dizzy or lightheaded  Easy bleeding or bruising    Nausea that stops you from eating or drinking and/or is not  relieved by prescribed medicines    Throwing up more than 3 times a day    Signs of allergic reaction: swelling of the face, feeling like your tongue or throat are swelling, trouble breathing, rash, itching, fever, chills, feeling dizzy, and/or feeling that your heart is beating in a fast or not normal way. If this happens, call 911 for emergency care.    If you think you may be pregnant or may have impregnated your partner   Reproduction Warnings    Pregnancy warning: This drug can have harmful effects on the unborn baby. Women of childbearing potential should use effective methods of birth control during your cancer treatment and for at least 6 months after treatment. Men with male partners of childbearing potential should use effective methods of birth control (or condoms) during your cancer treatment and for at least 4 months after your cancer treatment. Let your doctor know right away if you think you may be pregnant or may have impregnated your partner.    Breastfeeding warning: It is not known if this drug passes into breast milk. For this reason, women should talk to their doctor about the risks and benefits of breastfeeding during treatment with this drug because this drug may enter the breast milk and cause harm to a breastfeeding baby.    Fertility warning: In men and women both, this drug may affect your ability to have children in the future. Talk with your doctor or nurse if you plan to have children. Ask for information on sperm or egg banking.  SELF CARE ACTIVITIES WHILE ON CHEMOTHERAPY/IMMUNOTHERAPY:  Hydration Increase your fluid intake 48 hours prior to treatment and drink at least 8 to 12 cups (64 ounces) of water/decaffeinated beverages per day after treatment. You can still have your cup of coffee or soda but these beverages do not count as part of your 8 to 12 cups that you need to drink daily. No alcohol intake.  Medications Continue taking your normal prescription  medication as prescribed.  If you start any new herbal or new supplements please let us know first to make sure it is safe.  Mouth Care Have teeth cleaned professionally before starting treatment. Keep dentures and partial plates clean. Use soft toothbrush and do not use mouthwashes that contain alcohol. Biotene is a good mouthwash that is available at most pharmacies or may be ordered by calling [phone]. Use warm salt water gargles (1 teaspoon salt per 1 quart warm water) before and after meals and at bedtime. Or you may rinse with 2 tablespoons of three-percent hydrogen peroxide mixed in eight ounces of water. If you are still having problems with your mouth or sores in your mouth  please call the clinic. If you need dental work, please let the doctor know before you go for your appointment so that we can coordinate the best possible time for you in regards to your chemo regimen. You need to also let your dentist know that you are actively taking chemo. We may need to do labs prior to your dental appointment.  Skin Care Always use sunscreen that has not expired and with SPF (Sun Protection Factor) of 50 or higher. Wear hats to protect your head from the sun. Remember to use sunscreen on your hands, ears, face, & feet.  Use good moisturizing lotions such as udder cream, eucerin, or even Vaseline. Some chemotherapies can cause dry skin, color changes in your skin and nails.    Avoid long, hot showers or baths. Use gentle, fragrance-free soaps and laundry detergent. Use moisturizers, preferably creams or ointments rather than lotions because the thicker consistency is better at preventing skin dehydration. Apply the cream or ointment within 15 minutes of showering. Reapply moisturizer at night, and moisturize your hands every time after you wash them.   Infection Prevention Please wash your hands for at least 30 seconds using warm soapy water. Handwashing is the #1 way to prevent the spread of  germs. Stay away from sick people or people who are getting over a cold. If you develop respiratory systems such as green/yellow mucus production or productive cough or persistent cough let us know and we will see if you need an antibiotic. It is a good idea to keep a pair of gloves on when going into grocery stores/[employer] to decrease your risk of coming into contact with germs on the carts, etc. Carry alcohol hand gel with you at all times and use it frequently if out in public. If your temperature reaches 100.5 or higher please call the clinic and let us know.  If it is after hours or on the weekend please go to the ER if your temperature is over 100.4.  Please have your own personal thermometer at home to use.    Sex and bodily fluids If you are going to have sex, a condom must be used to protect the person that isn't taking immunotherapy. For a few days after treatment, immunotherapy can be excreted through your bodily fluids.  When using the toilet please close the lid and flush the toilet twice.  Do this for a few day after you have had immunotherapy.   Contraception It is not known for sure whether or not immunotherapy drugs can be passed on through semen or secretions from the vagina. Because of this some doctors advise people to use a barrier method if you have sex during treatment. This applies to vaginal, anal or oral sex.  Generally, doctors advise a barrier method only for the time you are actually having the treatment and for about a week after your treatment.  Advice like this can be worrying, but this does not mean that you have to avoid being intimate with your partner. You can still have close contact with your partner and continue to enjoy sex.  Animals If you have cats or birds we just ask that you not change the litter or change the cage.  Please have someone else do this for you while you are on immunotherapy.   Food Safety During and After Cancer Treatment Food safety is  important for people both during and after cancer treatment. Cancer and cancer treatments, such as chemotherapy, radiation therapy, and stem cell/bone  marrow transplantation, often weaken the immune system. This makes it harder for your body to protect itself from foodborne illness, also called food poisoning. Foodborne illness is caused by eating food that contains harmful bacteria, parasites, or viruses.  Foods to avoid Some foods have a higher risk of becoming tainted with bacteria. These include: Unwashed fresh fruit and vegetables, especially leafy vegetables that can hide dirt and other contaminants Raw sprouts, such as alfalfa sprouts Raw or undercooked beef, especially ground beef, or other raw or undercooked meat and poultry Fatty, fried, or spicy foods immediately before or after treatment.  These can sit heavy on your stomach and make you feel nauseous. Raw or undercooked shellfish, such as oysters. Sushi and sashimi, which often contain raw fish.  Unpasteurized beverages, such as unpasteurized fruit juices, raw milk, raw yogurt, or cider Undercooked eggs, such as soft boiled, over easy, and poached; raw, unpasteurized eggs; or foods made with raw egg, such as homemade raw cookie dough and homemade mayonnaise  Simple steps for food safety  Shop smart. Do not buy food stored or displayed in an unclean area. Do not buy bruised or damaged fruits or vegetables. Do not buy cans that have cracks, dents, or bulges. Pick up foods that can spoil at the end of your shopping trip and store them in a cooler on the way home.  Prepare and clean up foods carefully. Rinse all fresh fruits and vegetables under running water, and dry them with a clean towel or paper towel. Clean the top of cans before opening them. After preparing food, wash your hands for 20 seconds with hot water and soap. Pay special attention to areas between fingers and under nails. Clean your utensils and dishes with hot  water and soap. Disinfect your kitchen and cutting boards using 1 teaspoon of liquid, unscented bleach mixed into 1 quart of water.    Dispose of old food. Eat canned and packaged food before its expiration date (the "use by" or "best before" date). Consume refrigerated leftovers within 3 to 4 days. After that time, throw out the food. Even if the food does not smell or look spoiled, it still may be unsafe. Some bacteria, such as Listeria, can grow even on foods stored in the refrigerator if they are kept for too long.  Take precautions when eating out. At restaurants, avoid buffets and salad bars where food sits out for a long time and comes in contact with many people. Food can become contaminated when someone with a virus, often a norovirus, or another "bug" handles it. Put any leftover food in a "to-go" container yourself, rather than having the server do it. And, refrigerate leftovers as soon as you get home. Choose restaurants that are clean and that are willing to prepare your food as you order it cooked.    SYMPTOMS TO REPORT AS SOON AS POSSIBLE AFTER TREATMENT:  FEVER GREATER THAN 100.4 F CHILLS WITH OR WITHOUT FEVER NAUSEA AND VOMITING THAT IS NOT CONTROLLED WITH YOUR NAUSEA MEDICATION UNUSUAL SHORTNESS OF BREATH UNUSUAL BRUISING OR BLEEDING TENDERNESS IN MOUTH AND THROAT WITH OR WITHOUT PRESENCE OF ULCERS URINARY PROBLEMS BOWEL PROBLEMS UNUSUAL RASH     Wear comfortable clothing and clothing appropriate for easy access to any Portacath or PICC line. Let us know if there is anything that we can do to make your therapy better!   What to do if you need assistance after hours or on the weekends: CALL [phone].  HOLD on the line, do  not hang up.  You will hear multiple messages but at the end you will be connected with a nurse triage line.  They will contact the doctor if necessary.  Most of the time they will be able to assist you.  Do not call the hospital operator.    I  have been informed and understand all of the instructions given to me and have received a copy. I have been instructed to call the clinic [phone] or my family physician as soon as possible for continued medical care, if indicated. I do not have any more questions at this time but understand that I may call the [facility] or the Patient Navigator at [phone] during office hours should I have questions or need assistance in obtaining follow-up care.

## 2020-09-29 ENCOUNTER — Ambulatory Visit (HOSPITAL_COMMUNITY): Payer: Medicare Other | Admitting: Hematology

## 2020-09-29 ENCOUNTER — Other Ambulatory Visit: Payer: Self-pay | Admitting: Radiology

## 2020-09-29 ENCOUNTER — Ambulatory Visit (HOSPITAL_COMMUNITY)
Admission: RE | Admit: 2020-09-29 | Discharge: 2020-09-29 | Disposition: A | Discharge status: Home / Self Care | Payer: Medicare Other | Source: Ambulatory Visit | Attending: Hematology and Oncology | Admitting: Hematology and Oncology

## 2020-09-29 ENCOUNTER — Encounter (HOSPITAL_COMMUNITY): Payer: Medicare Other | Admitting: Dietician

## 2020-09-29 ENCOUNTER — Ambulatory Visit (HOSPITAL_COMMUNITY): Payer: Medicare Other

## 2020-09-29 ENCOUNTER — Other Ambulatory Visit: Payer: Self-pay

## 2020-09-29 ENCOUNTER — Other Ambulatory Visit (HOSPITAL_COMMUNITY): Payer: Medicare Other

## 2020-09-29 MED ORDER — HEPARIN SOD (PORK) LOCK FLUSH 100 UNIT/ML IV SOLN
INTRAVENOUS | Status: AC
  Filled 2020-09-29: qty 5

## 2020-09-29 MED ORDER — LIDOCAINE HCL 1 % IJ SOLN
INTRAMUSCULAR | Status: AC
  Filled 2020-09-29: qty 20

## 2020-09-30 ENCOUNTER — Ambulatory Visit (HOSPITAL_COMMUNITY): Payer: Medicare Other

## 2020-09-30 ENCOUNTER — Encounter (HOSPITAL_COMMUNITY): Payer: Self-pay

## 2020-09-30 ENCOUNTER — Ambulatory Visit (HOSPITAL_COMMUNITY)
Admission: RE | Admit: 2020-09-30 | Discharge: 2020-09-30 | Disposition: A | Discharge status: Home / Self Care | Payer: Medicare Other | Source: Ambulatory Visit | Attending: Hematology and Oncology | Admitting: Hematology and Oncology

## 2020-09-30 ENCOUNTER — Other Ambulatory Visit: Payer: Self-pay

## 2020-09-30 DIAGNOSIS — Z79890 Hormone replacement therapy (postmenopausal): Secondary | ICD-10-CM | POA: Diagnosis not present

## 2020-09-30 DIAGNOSIS — Z87891 Personal history of nicotine dependence: Secondary | ICD-10-CM | POA: Insufficient documentation

## 2020-09-30 DIAGNOSIS — Z800 Family history of malignant neoplasm of digestive organs: Secondary | ICD-10-CM | POA: Diagnosis not present

## 2020-09-30 DIAGNOSIS — E890 Postprocedural hypothyroidism: Secondary | ICD-10-CM | POA: Insufficient documentation

## 2020-09-30 DIAGNOSIS — C3492 Malignant neoplasm of unspecified part of left bronchus or lung: Secondary | ICD-10-CM | POA: Insufficient documentation

## 2020-09-30 DIAGNOSIS — Z791 Long term (current) use of non-steroidal anti-inflammatories (NSAID): Secondary | ICD-10-CM | POA: Diagnosis not present

## 2020-09-30 DIAGNOSIS — Z79899 Other long term (current) drug therapy: Secondary | ICD-10-CM | POA: Insufficient documentation

## 2020-09-30 HISTORY — PX: IR IMAGING GUIDED PORT INSERTION: IMG5740

## 2020-09-30 MED ORDER — HEPARIN SOD (PORK) LOCK FLUSH 100 UNIT/ML IV SOLN
INTRAVENOUS | Status: AC
  Filled 2020-09-30: qty 5

## 2020-09-30 MED ORDER — MIDAZOLAM HCL 2 MG/2ML IJ SOLN
INTRAMUSCULAR | Status: AC | PRN
  Administered 2020-09-30: 0.5 mg via INTRAVENOUS
  Administered 2020-09-30: 1 mg via INTRAVENOUS

## 2020-09-30 MED ORDER — FENTANYL CITRATE (PF) 100 MCG/2ML IJ SOLN
INTRAMUSCULAR | Status: AC | PRN
  Administered 2020-09-30: 50 ug via INTRAVENOUS

## 2020-09-30 MED ORDER — FENTANYL CITRATE (PF) 100 MCG/2ML IJ SOLN
INTRAMUSCULAR | Status: AC
  Filled 2020-09-30: qty 2

## 2020-09-30 MED ORDER — MIDAZOLAM HCL 2 MG/2ML IJ SOLN
INTRAMUSCULAR | Status: AC
  Filled 2020-09-30: qty 2

## 2020-09-30 MED ORDER — LIDOCAINE HCL 1 % IJ SOLN
INTRAMUSCULAR | Status: AC
  Filled 2020-09-30: qty 20

## 2020-09-30 MED ORDER — SODIUM CHLORIDE 0.9 % IV SOLN: INTRAVENOUS | Status: DC

## 2020-09-30 NOTE — Procedures (Addendum)
Vascular and Interventional Radiology Procedure Note  Patient: Miguel Colon DOB: [birth year] Medical Record Number: [id number] Note Date/Time: 09/30/20 1:03 PM   Performing Physician: Michaelle Birks, MD Assistant(s): None  Diagnosis: Lung cancer  Procedure: PORT PLACEMENT  Anesthesia: Conscious Sedation Complications: None Estimated Blood Loss: Minimal  Findings:  Successful right-sided SL port placement, with the tip of the catheter in the proximal right atrium.  Plan: Catheter ready for use.  See detailed procedure note with images in PACS. The patient tolerated the procedure well without incident or complication and was returned to Recovery in stable condition.    Michaelle Birks, MD [facility]   Pager. [place]

## 2020-09-30 NOTE — H&P (Signed)
Chief Complaint: Patient was seen in consultation today for Port-a-cath placement   at the request of [place]  Referring Physician(s): Heath Lark  Supervising Physician: Michaelle Birks  Patient Status: [facility] - Out-pt  History of Present Illness: Miguel Colon is a 65 y.o. male w/ dx of small cell lung cancer, Pancoast tumor.  Significant smoking hx. New L arm pain and weakness  Went to ED 1 month later for eval. CT on 08/04/20;  IMPRESSION: No evidence of thoracic aortic aneurysm or dissection.  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 No findings specific for metastatic disease.  Biopsy 09/05/20: A. LUNG, LUL, BIOPSY:  - Poorly differentiated carcinoma.   Now follows w/ Dr. Alvy Bimler. Plan for chemo 10/07/20. Scheduled today for port-a cath placement.   Per chart: Pt has been scheduled for this placement 3 other times. All rescheduled secondary to either not NPO; fever; or late for appt.   Plan for Port-a-cath today  Past Medical History:  Diagnosis Date   Asthma    Back pain    Lung cancer ([place])    Migraines    Thyroid disease     Past Surgical History:  Procedure Laterality Date   THYROIDECTOMY      Allergies: Patient has no known allergies.  Medications: Prior to Admission medications   Medication Sig Start Date End Date Taking? Authorizing Provider  acetaminophen (TYLENOL) 500 MG tablet Take 2 tablets (1,000 mg total) by mouth 3 (three) times daily. 09/09/20  Yes Acquanetta Chain, DO  levothyroxine (SYNTHROID) 200 MCG tablet Take 200 mcg by mouth daily before breakfast.   Yes [provider]  meloxicam (MOBIC) 15 MG tablet Take 1 tablet (15 mg total) by mouth daily. 09/09/20 09/09/21 Yes Acquanetta Chain, DO  methadone (DOLOPHINE) 10 MG tablet Take 1 tablet (10 mg total) by mouth every 12 (twelve) hours. 09/17/20  Yes Gorsuch, Ni, MD  naloxegol oxalate (MOVANTIK) 25 MG TABS tablet Take 1  tablet (25 mg total) by mouth daily. 09/09/20  Yes Acquanetta Chain, DO  nicotine (NICODERM CQ - DOSED IN MG/24 HOURS) 14 mg/24hr patch Place 1 patch (14 mg total) onto the skin daily. 09/09/20  Yes Acquanetta Chain, DO  traZODone (DESYREL) 50 MG tablet Take 1 tablet (50 mg total) by mouth at bedtime. 09/09/20  Yes Lane Hacker L, DO  CISPLATIN IV Inject into the vein every 21 ( twenty-one) days. 10/07/20   [provider]  Ensure (ENSURE) Take 237 mLs by mouth daily.    [provider]  ETOPOSIDE IV Inject into the vein every 21 ( twenty-one) days. Days 1-3 every 21 days    [provider]  lidocaine (LIDODERM) 5 % Place 1 patch onto the skin daily. Remove & Discard patch within 12 hours or as directed by MD 09/09/20   Acquanetta Chain, DO  lidocaine-prilocaine (EMLA) cream Apply to affected area once 09/17/20   Heath Lark, MD  ondansetron (ZOFRAN) 8 MG tablet Take 1 tablet (8 mg total) by mouth 2 (two) times daily as needed. Start on the third day after cisplatin chemotherapy. Patient taking differently: Take 8 mg by mouth 2 (two) times daily as needed for nausea or vomiting. Start on the third day after cisplatin chemotherapy. 09/17/20   Heath Lark, MD  polyethylene glycol (MIRALAX) 17 g packet Take 17 g by mouth daily. Patient not taking: No sig reported 09/17/20   Heath Lark, MD  prochlorperazine (COMPAZINE) 10  MG tablet Take 1 tablet (10 mg total) by mouth every 6 (six) hours as needed (Nausea or vomiting). 09/17/20   Heath Lark, MD  senna (SENOKOT) 8.6 MG TABS tablet Take 2 tablets (17.2 mg total) by mouth 2 (two) times daily. 09/17/20   Heath Lark, MD     Family History  Problem Relation Age of Onset   Hypertension Mother    Hypertension Father    Colon cancer Father 57    Social History   Socioeconomic History   Marital status: Legally Separated    Spouse name: Not on file   Number of children: 3   Years of education: Not on file   Highest  education level: Not on file  Occupational History   Occupation: Retired  Tobacco Use   Smoking status: Former    Packs/day: 0.50    Years: 47.00    Pack years: 23.50    Types: Cigarettes    Quit date: 09/02/2020    Years since quitting: 0.0   Smokeless tobacco: Never  Vaping Use   Vaping Use: Never used  Substance and Sexual Activity   Alcohol use: No   Drug use: No   Sexual activity: Not Currently  Other Topics Concern   Not on file  Social History Narrative   Not on file   Social Determinants of Health   Financial Resource Strain: Medium Risk   Difficulty of Paying Living Expenses: Somewhat hard  Food Insecurity: No Food Insecurity   Worried About Charity fundraiser in the Last Year: Never true   Ran Out of Food in the Last Year: Never true  Transportation Needs: Unmet Transportation Needs   Lack of Transportation (Medical): Yes   Lack of Transportation (Non-Medical): Yes  Physical Activity: Insufficiently Active   Days of Exercise per Week: 5 days   Minutes of Exercise per Session: 20 min  Stress: No Stress Concern Present   Feeling of Stress : Not at all  Social Connections: Socially Isolated   Frequency of Communication with Friends and Family: Three times a week   Frequency of Social Gatherings with Friends and Family: Three times a week   Attends Religious Services: Never   Active Member of Clubs or Organizations: No   Attends Archivist Meetings: Never   Marital Status: Separated      Review of Systems: A 12 point ROS discussed and pertinent positives are indicated in the HPI above.  All other systems are negative.  Review of Systems  Constitutional:  Negative for fever.  Respiratory:  Positive for wheezing.   Cardiovascular:  Negative for chest pain.  Gastrointestinal:  Negative for abdominal pain.  Psychiatric/Behavioral:  Negative for behavioral problems and confusion.    Vital Signs: BP 113/81   Pulse 70   Temp 98.3 F (36.8 C)  (Oral)   Ht 5\' 10"  (1.778 m)   Wt 155 lb (70.3 kg)   SpO2 98%   BMI 22.24 kg/m   Physical Exam HENT:     Mouth/Throat:     Mouth: Mucous membranes are moist.  Cardiovascular:     Rate and Rhythm: Normal rate and regular rhythm.     Heart sounds: Normal heart sounds.  Pulmonary:     Effort: Pulmonary effort is normal.     Breath sounds: Wheezing present.  Abdominal:     Palpations: Abdomen is soft.  Musculoskeletal:        General: Normal range of motion.  Skin:    General:  Skin is warm.  Neurological:     Mental Status: He is alert and oriented to person, place, and time.  Psychiatric:        Behavior: Behavior normal.    Imaging: MR BRAIN WO CONTRAST  Result Date: 09/02/2020 CLINICAL DATA:  Left arm numbness. EXAM: MRI HEAD WITHOUT CONTRAST TECHNIQUE: Multiplanar, multiecho pulse sequences of the brain and surrounding structures were obtained without intravenous contrast. COMPARISON:  Head CT 12/17/2014 FINDINGS: Brain: There is no evidence of an acute infarct, intracranial hemorrhage, mass, midline shift, or extra-axial fluid collection. Small T2 hyperintensities in the cerebral white matter bilaterally are nonspecific but compatible with mild chronic small vessel ischemic disease. The ventricles and sulci are within normal limits for age. Vascular: Major intracranial vascular flow voids are preserved. Skull and upper cervical spine: Unremarkable bone marrow signal. Sinuses/Orbits: Unremarkable orbits. Paranasal sinuses and mastoid air cells are clear. Other: None. IMPRESSION: 1. No acute intracranial abnormality. 2. Mild chronic small vessel ischemic disease. Electronically Signed   By: Logan Bores M.D.   On: 09/02/2020 14:30   MR Cervical Spine Wo Contrast  Result Date: 09/02/2020 CLINICAL DATA:  Left arm numbness. EXAM: MRI CERVICAL SPINE WITHOUT CONTRAST TECHNIQUE: Multiplanar, multisequence MR imaging of the cervical spine was performed. No intravenous contrast was  administered. COMPARISON:  Chest CTA 08/04/2020 FINDINGS: The study is motion degraded including severe motion on axial T1 and T2 gradient echo sequences. Alignment: Trace retrolisthesis of C3 on C4. Straightening of the normal cervical lordosis. Vertebrae: No fracture or suspicious marrow lesion. Degenerative endplate changes at S9-6 including mild degenerative edema associated with moderate disc space narrowing. Cord: Abnormal T2 hyperintensity in the cord bilaterally at C3-4. Posterior Fossa, vertebral arteries, paraspinal tissues: Partial imaging of known left apical lung mass which invades into the soft tissues superior and posterior to the lung apex including the paraspinal soft tissues at T1-2. unremarkable included posterior fossa. Disc levels: C2-3: Disc bulging and right uncovertebral spurring result in mild right neural foraminal stenosis without significant spinal stenosis. C3-4: Disc bulging and uncovertebral spurring result in severe spinal stenosis with severe cord flattening and severe bilateral neural foraminal stenosis. C4-5: Disc bulging, uncovertebral spurring, and mild facet arthrosis result in moderate right neural foraminal stenosis without significant spinal stenosis. C5-6: Mild disc bulging and left facet arthrosis without stenosis. C6-7: Negative. C7-T1: Negative. IMPRESSION: 1. Motion degraded examination. 2. Partial imaging of known invasive left apical lung mass which likely involves the left brachial plexus. 3. Cervical disc degeneration greatest at C3-4 where there is severe spinal stenosis and cord signal abnormality suggesting spondylotic myelopathy. 4. Severe bilateral neural foraminal stenosis at C3-4. Electronically Signed   By: Logan Bores M.D.   On: 09/02/2020 14:46   CT ABDOMEN PELVIS W CONTRAST  Result Date: 09/04/2020 CLINICAL DATA:  Follow-up to lung cancer diagnosis. Evaluate for metastatic disease. EXAM: CT ABDOMEN AND PELVIS WITH CONTRAST TECHNIQUE: Multidetector CT  imaging of the abdomen and pelvis was performed using the standard protocol following bolus administration of intravenous contrast. CONTRAST:  150mL OMNIPAQUE IOHEXOL 300 MG/ML  SOLN COMPARISON:  CT abdomen and pelvis 11/09/2018 FINDINGS: Lower chest: Linear atelectasis or scarring in the right lung base. Circumscribed nodule in the left base measuring 1.1 cm diameter, likely metastatic. Hepatobiliary: No focal liver lesions identified. Gallbladder and bile ducts are normal. Pancreas: Unremarkable. No pancreatic ductal dilatation or surrounding inflammatory changes. Spleen: Normal in size without focal abnormality. Adrenals/Urinary Tract: No adrenal gland nodules. Kidneys are symmetrical. Subcentimeter cysts. No  solid mass. No hydronephrosis or hydroureter. Bladder is unremarkable. Stomach/Bowel: Stomach, small bowel, and colon are not abnormally distended. The colon is diffusely stool-filled with prominent stool in the rectum. Likely constipation. No wall thickening or inflammatory changes. Appendix is not identified. Vascular/Lymphatic: Aortic atherosclerosis. No enlarged abdominal or pelvic lymph nodes. Reproductive: Prostate is unremarkable. Other: No free air or free fluid in the abdomen. Abdominal wall musculature appears intact. Musculoskeletal: Mild degenerative changes in the spine. No destructive or expansile bone lesions are appreciated. There a few scattered subcentimeter foci of sclerosis, likely representing bone islands. IMPRESSION: 1. Left lung base nodule, likely metastatic. 2. No metastatic disease demonstrated in the abdomen or pelvis. 3. Diffusely stool-filled colon with prominent stool filled rectum, likely constipation. Electronically Signed   By: Lucienne Capers M.D.   On: 09/04/2020 19:12   CT LUNG MASS BIOPSY  Result Date: 09/05/2020 Arne Cleveland, MD     09/05/2020  1:52 PM Procedure: CT core biopsy LUL lung mass  EBL:   minimal Complications:  none immediate See full dictation in  [employer]. Dillard Cannon MD Main # [phone] Pager  [phone]    Labs:  CBC: Recent Labs    08/04/20 1325 09/02/20 1335 09/04/20 0414  WBC 5.5 4.8 4.1  HGB 14.1 13.8 12.6*  HCT 42.9 41.3 37.9*  PLT 295 253 203    COAGS: No results for input(s): INR, APTT in the last 8760 hours.  BMP: Recent Labs    08/04/20 1325 09/02/20 1335 09/03/20 0332 09/04/20 0414  NA 134* 137 135 136  K 3.9 3.6 3.7 4.0  CL 101 102 102 102  CO2 28 26 26 28   GLUCOSE 86 83 91 83  BUN 15 16 15 13   CALCIUM 8.7* 8.6* 7.9* 8.7*  CREATININE 1.10 0.99 0.77 1.04  GFRNONAA >60 >60 >60 >60    LIVER FUNCTION TESTS: Recent Labs    08/04/20 1325 09/02/20 1335  BILITOT 0.3 0.5  AST 22 30  ALT 15 21  ALKPHOS 40 36*  PROT 6.9 7.7  ALBUMIN 3.1* 3.6    TUMOR MARKERS: No results for input(s): AFPTM, CEA, CA199, CHROMGRNA in the last 8760 hours.  Assessment and Plan:  Pt recent dx small cell lung Ca and Pancoast tumor.  Follows with Dr. Alvy Bimler Plan for chemotherapy to begin 10/07/20 Port-a-cath placement today  Risks and benefits of image guided port-a-catheter placement was discussed with the patient including, but not limited to bleeding, infection, pneumothorax, or fibrin sheath development and need for additional procedures.  All of the patient's questions were answered, patient is agreeable to proceed. Consent signed and in chart.     Thank you for this interesting consult.  I greatly enjoyed meeting Scotland Korver and look forward to participating in their care.  A copy of this report was sent to the requesting provider on this date.  Electronically Signed: Lavonia Drafts, PA-C 09/30/2020, 10:59 AM   I spent a total of  25 Minutes in face to face in clinical consultation, greater than 50% of which was counseling/coordinating care for port-a-cath placement

## 2020-10-01 ENCOUNTER — Ambulatory Visit (HOSPITAL_COMMUNITY): Payer: Medicare Other

## 2020-10-01 NOTE — Progress Notes (Signed)
Pharmacist Chemotherapy Monitoring - Initial Assessment    Anticipated start date: 10/07/20   The following has been reviewed per standard work regarding the patient's treatment regimen: The patient's diagnosis, treatment plan and drug doses, and organ/hematologic function Lab orders and baseline tests specific to treatment regimen  The treatment plan start date, drug sequencing, and pre-medications Prior authorization status  Patient's documented medication list, including drug-drug interaction screen and prescriptions for anti-emetics and supportive care specific to the treatment regimen The drug concentrations, fluid compatibility, administration routes, and timing of the medications to be used The patient's access for treatment and lifetime cumulative dose history, if applicable  The patient's medication allergies and previous infusion related reactions, if applicable   Changes made to treatment plan:  treatment plan date  Follow up needed:  N/A   Wynona Neat, [facility], 10/01/2020  2:44 PM

## 2020-10-02 ENCOUNTER — Encounter (HOSPITAL_COMMUNITY)
Admission: RE | Admit: 2020-10-02 | Discharge: 2020-10-02 | Disposition: A | Discharge status: Home / Self Care | Payer: Medicare Other | Source: Ambulatory Visit | Attending: Hematology and Oncology | Admitting: Hematology and Oncology

## 2020-10-02 ENCOUNTER — Other Ambulatory Visit: Payer: Self-pay

## 2020-10-02 DIAGNOSIS — C3490 Malignant neoplasm of unspecified part of unspecified bronchus or lung: Secondary | ICD-10-CM | POA: Insufficient documentation

## 2020-10-02 MED ORDER — FLUDEOXYGLUCOSE F - 18 (FDG) INJECTION
7.8800 | Freq: Once | INTRAVENOUS | Status: AC | PRN
  Administered 2020-10-02: 7.88 via INTRAVENOUS

## 2020-10-06 NOTE — Progress Notes (Signed)
[name], [name] [address]   CLINIC:  Medical Oncology/Hematology  PCP:  [name], MD [address] [place] / [place] [address] [phone]   REASON FOR VISIT:  Follow-up for small cell lung cancer  PRIOR THERAPY: none  NGS Results: not done  CURRENT THERAPY: Cisplatin D1 + Etoposide D1-3 q21d  BRIEF ONCOLOGIC HISTORY:  Oncology History  Small cell lung cancer, left ([facility])  08/04/2020 Imaging   No evidence of thoracic aortic aneurysm or dissection.   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   No findings specific for metastatic disease.   Aortic Atherosclerosis (ICD10-I70.0) and Emphysema (ICD10-J43.9).   09/02/2020 Imaging   MRI brain IMPRESSION: 1. No acute intracranial abnormality. 2. Mild chronic small vessel ischemic disease.   09/04/2020 Imaging   CT abdomen and pelvis 1. Left lung base nodule, likely metastatic. 2. No metastatic disease demonstrated in the abdomen or pelvis. 3. Diffusely stool-filled Colon with prominent stool filled rectum, likely constipation.   09/05/2020 Pathology Results   FINAL MICROSCOPIC DIAGNOSIS:   A. LUNG, LUL, BIOPSY:  - Poorly differentiated carcinoma.  - See comment.   COMMENT:   The findings favor poorly differentiated non-small cell carcinoma.  Immunohistochemistry will be performed and reported as an addendum.   ADDENDUM:   By immunohistochemistry, the neoplastic cells are positive for TTF-1, synaptophysin and chromogranin (dot-like positivity) but negative for cytokeratin 5/6, cytokeratin 7, CD56, p40, and p63.  Overall the  morphology and immunophenotype, are consistent with a small cell carcinoma.       09/17/2020 Initial Diagnosis   Small cell lung cancer, left ([place])   09/17/2020 Cancer Staging   Staging form: Lung, AJCC 8th Edition - Clinical stage from 09/17/2020: Stage IIB (cT3, cN0, cM0) - Signed by Heath Lark, MD on 09/17/2020  Stage prefix: Initial diagnosis    10/07/2020 -  Chemotherapy    Patient is on Treatment Plan: LUNG SMALL CELL CISPLATIN D1 + ETOPOSIDE D1-3 Q21D         CANCER STAGING: Cancer Staging Small cell lung cancer, left ([facility]) Staging form: Lung, AJCC 8th Edition - Clinical stage from 09/17/2020: Stage IIB (cT3, cN0, cM0) - Signed by Heath Lark, MD on 09/17/2020   INTERVAL HISTORY:  Miguel Colon, a 65 y.o. male, returns for routine follow-up and consideration for next cycle of chemotherapy. [name] was last seen on 09/17/20.  Due for cycle #1 of Cisplatin + Etoposide today.   Overall, he tells me he has been feeling pretty well. He reports constant severe pain in his left shoulder, and tingling in his left arm and hand. He also reports numbness in his left pinky and ring finger. He was given lidocaine and 10 mg methadone which only provided slight temporary relief. He has lost 12 lbs since 08/02; he reports that his baseline weight is 160 lbs. He drinks around 2 quarts of water daily. He reports constipation, and his last BM was 08/04. He is not currently taking any mediation to treat this constipation.   He quit smoking in June 2022. His work was Hydrologist driving. His paternal aunt had an unspecified cancer, and he otherwise denies family history of cancer.  Overall, he feels ready for next cycle of chemo today.   REVIEW OF SYSTEMS:  Review of Systems  Constitutional:  Positive for fatigue (50%) and unexpected weight change. Negative for appetite change.  HENT:  Dry mouth  Respiratory:  Positive for shortness of breath (w/ exertion).   Cardiovascular:  Positive for palpitations.  Gastrointestinal:  Positive for constipation.  Musculoskeletal:  Positive for arthralgias (L shoulder).  Neurological:  Positive for numbness (L hand and arm).  All other systems reviewed and are negative.  PAST MEDICAL/SURGICAL HISTORY:  Past Medical  History:  Diagnosis Date   Asthma    Back pain    Lung cancer ()    Migraines    Thyroid disease    Past Surgical History:  Procedure Laterality Date   IR IMAGING GUIDED PORT INSERTION  09/30/2020   THYROIDECTOMY      SOCIAL HISTORY:  Social History   Socioeconomic History   Marital status: Legally Separated    Spouse name: Not on file   Number of children: 3   Years of education: Not on file   Highest education level: Not on file  Occupational History   Occupation: Retired  Tobacco Use   Smoking status: Former    Packs/day: 0.50    Years: 47.00    Pack years: 23.50    Types: Cigarettes    Quit date: 09/02/2020    Years since quitting: 0.0   Smokeless tobacco: Never  Vaping Use   Vaping Use: Never used  Substance and Sexual Activity   Alcohol use: No   Drug use: No   Sexual activity: Not Currently  Other Topics Concern   Not on file  Social History Narrative   Not on file   Social Determinants of Health   Financial Resource Strain: Medium Risk   Difficulty of Paying Living Expenses: Somewhat hard  Food Insecurity: No Food Insecurity   Worried About Charity fundraiser in the Last Year: Never true   [place] in the Last Year: Never true  Transportation Needs: Unmet Transportation Needs   Lack of Transportation (Medical): Yes   Lack of Transportation (Non-Medical): Yes  Physical Activity: Insufficiently Active   Days of Exercise per Week: 5 days   Minutes of Exercise per Session: 20 min  Stress: No Stress Concern Present   Feeling of Stress : Not at all  Social Connections: Socially Isolated   Frequency of Communication with Friends and Family: Three times a week   Frequency of Social Gatherings with Friends and Family: Three times a week   Attends Religious Services: Never   Active Member of Clubs or Organizations: No   Attends Archivist Meetings: Never   Marital Status: Separated  Intimate Partner Violence: Not At Risk   Fear of  Current or Ex-Partner: No   Emotionally Abused: No   Physically Abused: No   Sexually Abused: No    FAMILY HISTORY:  Family History  Problem Relation Age of Onset   Hypertension Mother    Hypertension Father    Colon cancer Father 55    CURRENT MEDICATIONS:  Current Outpatient Medications  Medication Sig Dispense Refill   acetaminophen (TYLENOL) 500 MG tablet Take 2 tablets (1,000 mg total) by mouth 3 (three) times daily. 30 tablet 0   CISPLATIN IV Inject into the vein every 21 ( twenty-one) days.     Ensure (ENSURE) Take 237 mLs by mouth daily.     ETOPOSIDE IV Inject into the vein every 21 ( twenty-one) days. Days 1-3 every 21 days     levothyroxine (SYNTHROID) 200 MCG tablet Take 200 mcg by mouth daily before breakfast.     lidocaine (LIDODERM) 5 % Place  1 patch onto the skin daily. Remove & Discard patch within 12 hours or as directed by MD 30 patch 0   lidocaine-prilocaine (EMLA) cream Apply to affected area once 30 g 3   meloxicam (MOBIC) 15 MG tablet Take 1 tablet (15 mg total) by mouth daily. 30 tablet 2   methadone (DOLOPHINE) 10 MG tablet Take 1 tablet (10 mg total) by mouth every 12 (twelve) hours. 60 tablet 0   naloxegol oxalate (MOVANTIK) 25 MG TABS tablet Take 1 tablet (25 mg total) by mouth daily. 30 tablet 3   nicotine (NICODERM CQ - DOSED IN MG/24 HOURS) 14 mg/24hr patch Place 1 patch (14 mg total) onto the skin daily. 28 patch 0   ondansetron (ZOFRAN) 8 MG tablet Take 1 tablet (8 mg total) by mouth 2 (two) times daily as needed. Start on the third day after cisplatin chemotherapy. (Patient taking differently: Take 8 mg by mouth 2 (two) times daily as needed for nausea or vomiting. Start on the third day after cisplatin chemotherapy.) 30 tablet 1   polyethylene glycol (MIRALAX) 17 g packet Take 17 g by mouth daily. 30 each 2   prochlorperazine (COMPAZINE) 10 MG tablet Take 1 tablet (10 mg total) by mouth every 6 (six) hours as needed (Nausea or vomiting). 30 tablet 1    senna (SENOKOT) 8.6 MG TABS tablet Take 2 tablets (17.2 mg total) by mouth 2 (two) times daily. 120 tablet 0   traZODone (DESYREL) 50 MG tablet Take 1 tablet (50 mg total) by mouth at bedtime. 30 tablet 3   No current facility-administered medications for this visit.    ALLERGIES:  No Known Allergies  PHYSICAL EXAM:  Performance status (ECOG): 1 - Symptomatic but completely ambulatory  Vitals:   10/07/20 0812  BP: 128/83  Pulse: 64  Resp: 18  Temp: (!) 96.7 F (35.9 C)  SpO2: 98%   Wt Readings from Last 3 Encounters:  10/07/20 143 lb 3.2 oz (65 kg)  09/30/20 155 lb (70.3 kg)  09/23/20 147 lb (66.7 kg)   Physical Exam Vitals reviewed.  Constitutional:      Appearance: Normal appearance.  Cardiovascular:     Rate and Rhythm: Normal rate and regular rhythm.     Pulses: Normal pulses.     Heart sounds: Normal heart sounds.  Pulmonary:     Effort: Pulmonary effort is normal.     Breath sounds: Normal breath sounds.  Neurological:     General: No focal deficit present.     Mental Status: He is alert and oriented to person, place, and time.  Psychiatric:        Mood and Affect: Mood normal.        Behavior: Behavior normal.    LABORATORY DATA:  I have reviewed the labs as listed.  CBC Latest Ref Rng & Units 10/07/2020 09/04/2020 09/02/2020  WBC 4.0 - 10.5 K/uL 4.9 4.1 4.8  Hemoglobin 13.0 - 17.0 g/dL 11.3(L) 12.6(L) 13.8  Hematocrit 39.0 - 52.0 % 34.0(L) 37.9(L) 41.3  Platelets 150 - 400 K/uL 367 203 253   CMP Latest Ref Rng & Units 10/07/2020 09/04/2020 09/03/2020  Glucose 70 - 99 mg/dL 106(H) 83 91  BUN 8 - 23 mg/dL _0 Creatinine 0.61 - 1.24 mg/dL 1.10 1.04 0.77  Sodium 135 - 145 mmol/L 139 136 135  Potassium 3.5 - 5.1 mmol/L 3.9 4.0 3.7  Chloride 98 - 111 mmol/L 104 102 102  CO2 22 - 32 mmol/L 30 28  26  Calcium 8.9 - 10.3 mg/dL 8.0(L) 8.7(L) 7.9(L)  Total Protein 6.5 - 8.1 g/dL 7.2 - -  Total Bilirubin 0.3 - 1.2 mg/dL 0.5 - -  Alkaline Phos 38 - 126 U/L 46 - -   AST 15 - 41 U/L 16 - -  ALT 0 - 44 U/L 12 - -    DIAGNOSTIC IMAGING:  I have independently reviewed the scans and discussed with the patient. NM PET Image Initial (PI) Skull Base To Thigh  Result Date: 10/04/2020 CLINICAL DATA:  Initial treatment strategy for small cell lung cancer. EXAM: NUCLEAR MEDICINE PET SKULL BASE TO THIGH TECHNIQUE: 7.8 mCi F-18 FDG was injected intravenously. Full-ring PET imaging was performed from the skull base to thigh after the radiotracer. CT data was obtained and used for attenuation correction and anatomic localization. Fasting blood glucose: 94 mg/dl COMPARISON:  Prior CT scans. FINDINGS: Mediastinal blood pool activity: SUV max 2.24 Liver activity: SUV max NA NECK: No hypermetabolic lymph nodes in the neck. Incidental CT findings: none CHEST: Large left apical lung mass is hypermetabolic with SUV max of 26.33. Findings are highly suspicious for chest wall invasion. There appears to be clear involvement of the apical anterior chest wall musculature (scalene muscle). Highly likely that there is also involvement of the left first and second ribs. MRI chest without and with contrast may be helpful for more definitive evaluation if clinically necessary. No enlarged or hypermetabolic mediastinal hilar lymph nodes. No supraclavicular or axillary adenopathy. No hypermetabolic pulmonary nodules. The 10.5 mm left lower lobe nodule on image number 151/3 does not show any FDG uptake. Incidental CT findings: Stable advanced emphysematous changes and pulmonary scarring. Stable densely calcified right upper lobe granuloma. ABDOMEN/PELVIS: No abnormal hypermetabolic activity within the liver, pancreas, adrenal glands, or spleen. No hypermetabolic lymph nodes in the abdomen or pelvis. Incidental CT findings: Age advanced atherosclerotic calcifications involving the aorta and iliac arteries. Moderate stool burden. SKELETON: No focal hypermetabolic activity to suggest skeletal metastasis.  Mild uptake around the left shoulder posterior is likely inflammatory change. Incidental CT findings: none IMPRESSION: 1. 8 cm left apical lung mass is hypermetabolic and consistent with known small cell lung cancer. 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 2. No findings suspicious for mediastinal/hilar adenopathy or metastatic disease involving the abdomen/pelvis or bony structures. 3. No FDG uptake in the left lower lobe pulmonary nodule. 4. Stable age advanced emphysematous changes, pulmonary scarring and vascular disease. Electronically Signed   By: Marijo Sanes M.D.   On: 10/04/2020 14:33   IR IMAGING GUIDED PORT INSERTION  Result Date: 09/30/2020 INDICATION: 65 year old male with lung cancer, recommend for chemotherapy EXAM: IMPLANTED PORT A CATH PLACEMENT WITH ULTRASOUND AND FLUOROSCOPIC GUIDANCE MEDICATIONS: None. ANESTHESIA/SEDATION: Moderate (conscious) sedation was employed during this procedure. A total of Versed 1.5 mg and Fentanyl 50 mcg was administered intravenously. Moderate Sedation Time: 27 minutes. The patient's level of consciousness and vital signs were monitored continuously by radiology nursing throughout the procedure under my direct supervision. FLUOROSCOPY TIME:  0 minutes, 6 seconds (1 mGy) COMPLICATIONS: None immediate. PROCEDURE: The procedure, risks, benefits, and alternatives were explained to the patient. Questions regarding the procedure were encouraged and answered. The patient understands and consents to the procedure. The right neck and chest were prepped with chlorhexidine in a sterile fashion, and a sterile drape was applied covering the operative field. Maximum barrier sterile technique with sterile gowns and gloves were used for the procedure.  A timeout was performed prior to the initiation of the procedure. Local anesthesia was provided with 1% lidocaine with epinephrine. After creating a small venotomy incision,  a micropuncture kit was utilized to access the internal jugular vein under direct, real-time ultrasound guidance. Ultrasound image documentation was performed. The microwire was kinked to measure appropriate catheter length. A subcutaneous port pocket was then created along the upper chest wall utilizing a combination of sharp and blunt dissection. The pocket was irrigated with sterile saline. A single lumen ISP power injectable port was chosen for placement. The 8 Fr catheter was tunneled from the port pocket site to the venotomy incision. The port was placed in the pocket. The external catheter was trimmed to appropriate length. At the venotomy, an 8 Fr peel-away sheath was placed over a guidewire under fluoroscopic guidance. The catheter was then placed through the sheath and the sheath was removed. Final catheter positioning was confirmed and documented with a fluoroscopic spot radiograph. The port was accessed with a Huber needle, aspirated and flushed with heparinized saline. The port pocket incision was closed with interrupted 3-0 Vicryl suture then Dermabond was applied, including at the venotomy incision. Dressings were placed. The patient tolerated the procedure well without immediate post procedural complication. IMPRESSION: Successful placement of a right internal jugular approach power-injectable Port-A-Cath. The catheter is ready for immediate use. Michaelle Birks, MD [facility] Radiology Electronically Signed   By: Michaelle Birks MD   On: 09/30/2020 16:45     ASSESSMENT:  1.  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  IHC shows positivity for TTF-1, synaptophysin and chromogranin (dot-like positivity), consistent with small cell lung cancer. - MRI of the brain without contrast on 09/02/2020-no metastasis. - PET scan on 10/02/2020 with 8 cm left apical lung mass,  hypermetabolic, highly suspicious for Pancoast tumor with chest wall invasion.  No findings suggestive of mediastinal/hilar adenopathy or other metastatic disease. - 15 pound weight loss in the last 2 months. - MR C-spine on 09/02/2020 with the left apical lung mass likely involving the left brachial plexus.  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  Severe bilateral neural foraminal stenosis at C3-C4.  2.  Social/family history: - Quit smoking on 08/05/2020.  He was a Administrator. - He lives by himself. - Paternal aunt died of cancer 5 years ago.  Type unknown.    PLAN:  1.  Stage IIIa (T4 N0 M0) small cell lung cancer: - We discussed the findings and images of the PET scan in detail. - He was already evaluated by Dr. Sondra Come at [facility]. - We talked about chemotherapy with cisplatin and etoposide every 21 days for 4 cycles with concurrent radiation. - We discussed various side effects of chemotherapy regimen including but not limited to cytopenias, increased risk of infection, nephrotoxicity, ototoxicity, neuropathy among others.  He understands and gives [place] permission to proceed with it. - We have reviewed his labs.  He will proceed with hydration today and the next 2 days.  He was told to increase his oral intake of water about 3 L/day. - He will be seen at our symptom management clinic next week for repeat labs and fluids.  2.  Left shoulder and arm pain: - This is from involvement of brachial plexus.  He also reports some shooting pain in the  first 3 fingers of the hand.  Decreased sensitivity in the ring and little fingers.  Does not report any functional problems with the left hand. - Currently taking methadone 10 mg daily.  Pain is not well controlled. - We will increase methadone to 10 mg twice daily. - We will add gabapentin 300 mg 3 times daily. - Pain will improve in the next couple of  weeks with initiation of chemotherapy.  3.  Constipation: - Likely opioid induced. - Recommend Movantik 25 mg daily. - Recommend starting Senokot 2 tablets daily.  Addendum: - While he was receiving premeds for the chemotherapy, he reported severe worsening of the pain in the left shoulder and arm. - He was initially given morphine 2 mg IV which helped for a few minutes. - He later received Dilaudid 2 mg which did not help much.  We have repeated another dose of morphine 2 mg and ketorolac 30 mg IV.  This has helped and he had to receive another dose of morphine 2 mg IV.  I have reevaluated again around 3:00 when he was completely pain-free.    Orders placed this encounter:  No orders of the defined types were placed in this encounter.    Derek Jack, MD [place] [phone]   I, Thana Ates, am acting as a scribe for Dr. Derek Jack.  I, Derek Jack MD, have reviewed the above documentation for accuracy and completeness, and I agree with the above.

## 2020-10-07 ENCOUNTER — Other Ambulatory Visit (HOSPITAL_COMMUNITY): Payer: Self-pay | Admitting: Hematology

## 2020-10-07 ENCOUNTER — Encounter (HOSPITAL_COMMUNITY): Payer: Self-pay | Admitting: Hematology and Oncology

## 2020-10-07 ENCOUNTER — Other Ambulatory Visit: Payer: Self-pay

## 2020-10-07 ENCOUNTER — Inpatient Hospital Stay (HOSPITAL_BASED_OUTPATIENT_CLINIC_OR_DEPARTMENT_OTHER): Payer: Medicare Other | Admitting: Hematology

## 2020-10-07 ENCOUNTER — Inpatient Hospital Stay (HOSPITAL_COMMUNITY): Payer: Medicare Other

## 2020-10-07 ENCOUNTER — Inpatient Hospital Stay (HOSPITAL_COMMUNITY): Payer: Medicare Other | Attending: Hematology and Oncology

## 2020-10-07 VITALS — BP 128/83 | HR 64 | Temp 96.7°F | Resp 18 | Wt 143.2 lb

## 2020-10-07 DIAGNOSIS — N179 Acute kidney failure, unspecified: Secondary | ICD-10-CM | POA: Diagnosis not present

## 2020-10-07 DIAGNOSIS — C3492 Malignant neoplasm of unspecified part of left bronchus or lung: Secondary | ICD-10-CM

## 2020-10-07 DIAGNOSIS — Z5111 Encounter for antineoplastic chemotherapy: Secondary | ICD-10-CM | POA: Diagnosis present

## 2020-10-07 DIAGNOSIS — M25512 Pain in left shoulder: Secondary | ICD-10-CM | POA: Diagnosis not present

## 2020-10-07 DIAGNOSIS — C3490 Malignant neoplasm of unspecified part of unspecified bronchus or lung: Secondary | ICD-10-CM

## 2020-10-07 DIAGNOSIS — C3412 Malignant neoplasm of upper lobe, left bronchus or lung: Secondary | ICD-10-CM

## 2020-10-07 DIAGNOSIS — Z452 Encounter for adjustment and management of vascular access device: Secondary | ICD-10-CM | POA: Diagnosis not present

## 2020-10-07 DIAGNOSIS — Z87891 Personal history of nicotine dependence: Secondary | ICD-10-CM | POA: Diagnosis not present

## 2020-10-07 DIAGNOSIS — R64 Cachexia: Secondary | ICD-10-CM | POA: Diagnosis not present

## 2020-10-07 LAB — CBC WITH DIFFERENTIAL/PLATELET
Abs Immature Granulocytes: 0.01 10*3/uL (ref 0.00–0.07)
Basophils Absolute: 0 10*3/uL (ref 0.0–0.1)
Basophils Relative: 1 %
Eosinophils Absolute: 0.2 10*3/uL (ref 0.0–0.5)
Eosinophils Relative: 3 %
HCT: 34 % — ABNORMAL LOW (ref 39.0–52.0)
Hemoglobin: 11.3 g/dL — ABNORMAL LOW (ref 13.0–17.0)
Immature Granulocytes: 0 %
Lymphocytes Relative: 36 %
Lymphs Abs: 1.8 10*3/uL (ref 0.7–4.0)
MCH: 33.2 pg (ref 26.0–34.0)
MCHC: 33.2 g/dL (ref 30.0–36.0)
MCV: 100 fL (ref 80.0–100.0)
Monocytes Absolute: 0.7 10*3/uL (ref 0.1–1.0)
Monocytes Relative: 14 %
Neutro Abs: 2.2 10*3/uL (ref 1.7–7.7)
Neutrophils Relative %: 46 %
Platelets: 367 10*3/uL (ref 150–400)
RBC: 3.4 MIL/uL — ABNORMAL LOW (ref 4.22–5.81)
RDW: 14.8 % (ref 11.5–15.5)
WBC: 4.9 10*3/uL (ref 4.0–10.5)
nRBC: 0 % (ref 0.0–0.2)

## 2020-10-07 LAB — COMPREHENSIVE METABOLIC PANEL
ALT: 12 U/L (ref 0–44)
AST: 16 U/L (ref 15–41)
Albumin: 3.2 g/dL — ABNORMAL LOW (ref 3.5–5.0)
Alkaline Phosphatase: 46 U/L (ref 38–126)
Anion gap: 5 (ref 5–15)
BUN: 17 mg/dL (ref 8–23)
CO2: 30 mmol/L (ref 22–32)
Calcium: 8 mg/dL — ABNORMAL LOW (ref 8.9–10.3)
Chloride: 104 mmol/L (ref 98–111)
Creatinine, Ser: 1.1 mg/dL (ref 0.61–1.24)
GFR, Estimated: >60 mL/min (ref >60)
Glucose, Bld: 106 mg/dL — ABNORMAL HIGH (ref 70–99)
Potassium: 3.9 mmol/L (ref 3.5–5.1)
Sodium: 139 mmol/L (ref 135–145)
Total Bilirubin: 0.5 mg/dL (ref 0.3–1.2)
Total Protein: 7.2 g/dL (ref 6.5–8.1)

## 2020-10-07 LAB — MAGNESIUM: Magnesium: 2 mg/dL (ref 1.7–2.4)

## 2020-10-07 MED ORDER — SODIUM CHLORIDE 0.9 % IV SOLN: Freq: Once | INTRAVENOUS | Status: DC

## 2020-10-07 MED ORDER — SODIUM CHLORIDE 0.9 % IV SOLN
150.0000 mg | Freq: Once | INTRAVENOUS | Status: AC
  Administered 2020-10-07: 150 mg via INTRAVENOUS
  Filled 2020-10-07: qty 5

## 2020-10-07 MED ORDER — SODIUM CHLORIDE 0.9 % IV SOLN
10.0000 mg | Freq: Once | INTRAVENOUS | Status: AC
  Administered 2020-10-07: 10 mg via INTRAVENOUS
  Filled 2020-10-07: qty 1

## 2020-10-07 MED ORDER — MAGNESIUM SULFATE 2 GM/50ML IV SOLN
2.0000 g | Freq: Once | INTRAVENOUS | Status: AC
  Administered 2020-10-07: 2 g via INTRAVENOUS

## 2020-10-07 MED ORDER — MORPHINE SULFATE (PF) 2 MG/ML IV SOLN
2.0000 mg | Freq: Once | INTRAVENOUS | Status: AC
  Administered 2020-10-07: 2 mg via INTRAVENOUS
  Filled 2020-10-07: qty 1

## 2020-10-07 MED ORDER — MORPHINE SULFATE (PF) 2 MG/ML IV SOLN: 2.0000 mg | Freq: Once | INTRAVENOUS | Status: DC

## 2020-10-07 MED ORDER — HYDROMORPHONE HCL 1 MG/ML IJ SOLN
INTRAMUSCULAR | Status: AC
  Filled 2020-10-07: qty 2

## 2020-10-07 MED ORDER — GABAPENTIN 300 MG PO CAPS: 300.0000 mg | ORAL_CAPSULE | Freq: Three times a day (TID) | ORAL | 3 refills | Status: DC

## 2020-10-07 MED ORDER — PALONOSETRON HCL INJECTION 0.25 MG/5ML
0.2500 mg | Freq: Once | INTRAVENOUS | Status: AC
  Administered 2020-10-07: 0.25 mg via INTRAVENOUS

## 2020-10-07 MED ORDER — SODIUM CHLORIDE 0.9% FLUSH
10.0000 mL | INTRAVENOUS | Status: DC | PRN
  Administered 2020-10-07: 10 mL

## 2020-10-07 MED ORDER — SODIUM CHLORIDE 0.9 % IV SOLN
100.0000 mg/m2 | Freq: Once | INTRAVENOUS | Status: AC
  Administered 2020-10-07: 180 mg via INTRAVENOUS
  Filled 2020-10-07: qty 9

## 2020-10-07 MED ORDER — MORPHINE SULFATE (PF) 2 MG/ML IV SOLN
INTRAVENOUS | Status: AC
  Filled 2020-10-07: qty 1

## 2020-10-07 MED ORDER — MAGNESIUM SULFATE 2 GM/50ML IV SOLN
INTRAVENOUS | Status: AC
  Filled 2020-10-07: qty 50

## 2020-10-07 MED ORDER — SODIUM CHLORIDE 0.9 % IV SOLN: Freq: Once | INTRAVENOUS | Status: AC

## 2020-10-07 MED ORDER — PALONOSETRON HCL INJECTION 0.25 MG/5ML
INTRAVENOUS | Status: AC
  Filled 2020-10-07: qty 5

## 2020-10-07 MED ORDER — KETOROLAC TROMETHAMINE 30 MG/ML IJ SOLN
30.0000 mg | Freq: Once | INTRAMUSCULAR | Status: AC
  Administered 2020-10-07: 30 mg via INTRAVENOUS
  Filled 2020-10-07: qty 1

## 2020-10-07 MED ORDER — SODIUM CHLORIDE 0.9 % IV SOLN
80.0000 mg/m2 | Freq: Once | INTRAVENOUS | Status: AC
  Administered 2020-10-07: 142 mg via INTRAVENOUS
  Filled 2020-10-07: qty 142

## 2020-10-07 MED ORDER — POTASSIUM CHLORIDE IN NACL 20-0.9 MEQ/L-% IV SOLN
Freq: Once | INTRAVENOUS | Status: AC
  Filled 2020-10-07: qty 1000

## 2020-10-07 MED ORDER — HYDROMORPHONE HCL 1 MG/ML IJ SOLN
2.0000 mg | Freq: Once | INTRAMUSCULAR | Status: AC
  Administered 2020-10-07: 2 mg via INTRAVENOUS

## 2020-10-07 MED ORDER — GABAPENTIN 300 MG PO CAPS
300.0000 mg | ORAL_CAPSULE | Freq: Once | ORAL | Status: AC
  Administered 2020-10-07: 300 mg via ORAL
  Filled 2020-10-07: qty 1

## 2020-10-07 MED ORDER — HEPARIN SOD (PORK) LOCK FLUSH 100 UNIT/ML IV SOLN
500.0000 [IU] | Freq: Once | INTRAVENOUS | Status: AC | PRN
  Administered 2020-10-07: 500 [IU]

## 2020-10-07 NOTE — Progress Notes (Signed)
Chaplain engaged in an initial visit with Mr. Brigitte Pulse.  Chaplain learned that he is originally from [place], [place].  Dekota shared that he wished he would have moved down [place] sooner.  Since being in [place] he has been able to plant his own garden, go fishing, and enjoy the [place] of the countryside.  Mr. Rostron also has family living close by to him both in the area and the surrounding [place].  He feels well supported by family.  He has two daughters and is a grandfather.  Isack shared pictures of his family with Chaplain.   Dhillon also spoke about his diagnosis, describing how he found out he had lung cancer.  He thought he was having a heart attack some months ago and so immediately took himself to the hospital.  Through scans done, they found a mass on his lung.  Yakub is grateful that it was caught early.  He is very hopeful about the treatment.  Chaplain affirmed Dicky' positivity and that he also knew to get medical attention right away.    Dorrell holds a philosophy about life to not stress himself out.  He sees the world as already stressful and sad enough so he is very disciplined in his thinking about life and the way he chooses to handle situations.  He chooses to take things as they come and just handle them, rather than be consumed by them.  Marguerite also strives to be grateful.  He shared about the different things he would see while living in [place], such as homelessness, and he chooses to appreciate what he does have.    Chaplain could assess that Samarion has a supportive community and positive outlook on life.  He also engages in some work with a group for therapy or support at [facility] in [place].  Chaplain offered listening, presence, and support.    10/07/20 1200  Clinical Encounter Type  Visited With Patient  Visit Type Initial

## 2020-10-07 NOTE — Patient Instructions (Addendum)
[place] at [facility] Discharge Instructions  You were seen today by Dr. Delton Coombes. He went over your recent results, and you received your treatment. Dr. Tomie China PA will see you in 1 week for labs and follow-up, and Dr. Delton Coombes will see you back in 3 weeks at your next treatment for labs and follow up.   Thank you for choosing [place] at [facility] to provide your oncology and hematology care.  To afford each patient quality time with our provider, please arrive at least 15 minutes before your scheduled appointment time.   If you have a lab appointment with the [facility] please come in thru the Main Entrance and check in at the main information desk  You need to re-schedule your appointment should you arrive 10 or more minutes late.  We strive to give you quality time with our providers, and arriving late affects you and other patients whose appointments are after yours.  Also, if you no show three or more times for appointments you may be dismissed from the clinic at the providers discretion.     Again, thank you for choosing [facility].  Our hope is that these requests will decrease the amount of time that you wait before being seen by our physicians.       _____________________________________________________________  Should you have questions after your visit to [facility], please contact our office at [phone] between the hours of 8:00 a.m. and 4:30 p.m.  Voicemails left after 4:00 p.m. will not be returned until the following business day.  For prescription refill requests, have your pharmacy contact our office and allow 72 hours.    Cancer Center Support Programs:   > Cancer Support Group  2nd Tuesday of the month 1pm-2pm, Journey Room

## 2020-10-07 NOTE — Progress Notes (Signed)
Patients port flushed without difficulty.  Good blood return noted with no bruising or swelling noted at site.  Stable during access and blood draw.  Patient to remain accessed for treatment. 

## 2020-10-07 NOTE — Progress Notes (Signed)
Patient presents today for treatment and follow up appointment with Dr. Delton Coombes. Labs drawn and pending. Vital signs within parameters fort treatment. Patient has no complaints of any changes since his last visit. MAR reviewed. Patient has pain in his left arm and hand that he rates a 5/10, constant. Patient states his Methadone 10 mg is not working to control his pain. Patient instructed to inform Dr.Katragadda about his pain management. Understanding verbalized. Patient states the pain is coming from the tumor pressing on a nerve per patient's words.   Labs within parameters for treatment.   Verbal order received from Dr. Egbert Garibaldi Rn. Dilaudid 2 mg IV now.   11:38 am Patient moaning in the bed and moving around. Patient states his pain is back in his left shoulder. 2 mgs of Dilaudid given IV .   12:15 pm Verbal order from Dr. Delton Coombes to give 2 mg of Morphine IV now. Dr. Delton Coombes at the bedside.   12:17 pm Toradol 30mg  given IV push.   12:20 pm 2 Mg IV Morphine given .   12:38 pm Patient laying supine. Eyes closed. Pain rated a 5/10.   12:41 pm Patient laying Right lateral with no complaints at this time.  Voided 210 mls.   Dr. Delton Coombes at the bedside to check on patient. No complaints at this time.  Treatment given today per MD orders. Tolerated infusion without adverse affects. Vital signs stable. No complaints at this time. Discharged from clinic ambulatory in stable condition. Alert and oriented x 3. F/U with [facility] as scheduled.

## 2020-10-07 NOTE — Progress Notes (Signed)
Patient has been assessed, vital signs and labs have been reviewed by Dr. Delton Coombes. ANC, Creatinine, LFTs, and Platelets are within treatment parameters per Dr. Delton Coombes. The patient is good to proceed with treatment at this time.  House fluids 1 liter today and 1 liter tomorrow.  Will give Morphine 2 mg IV x 1 due to left arm pain. Primary RN and pharmacy aware.

## 2020-10-07 NOTE — Patient Instructions (Signed)
[facility]  Discharge Instructions: Thank you for choosing [facility] to provide your oncology and hematology care.  If you have a lab appointment with the [facility], please come in thru the Main Entrance and check in at the main information desk.  Wear comfortable clothing and clothing appropriate for easy access to any Portacath or PICC line.   We strive to give you quality time with your provider. You may need to reschedule your appointment if you arrive late (15 or more minutes).  Arriving late affects you and other patients whose appointments are after yours.  Also, if you miss three or more appointments without notifying the office, you may be dismissed from the clinic at the provider's discretion.      For prescription refill requests, have your pharmacy contact our office and allow 72 hours for refills to be completed.    Today you received the following chemotherapy and/or immunotherapy agents Cisplatin/Etoposide.       To help prevent nausea and vomiting after your treatment, we encourage you to take your nausea medication as directed.  BELOW ARE SYMPTOMS THAT SHOULD BE REPORTED IMMEDIATELY: *FEVER GREATER THAN 100.4 F (38 C) OR HIGHER *CHILLS OR SWEATING *NAUSEA AND VOMITING THAT IS NOT CONTROLLED WITH YOUR NAUSEA MEDICATION *UNUSUAL SHORTNESS OF BREATH *UNUSUAL BRUISING OR BLEEDING *URINARY PROBLEMS (pain or burning when urinating, or frequent urination) *BOWEL PROBLEMS (unusual diarrhea, constipation, pain near the anus) TENDERNESS IN MOUTH AND THROAT WITH OR WITHOUT PRESENCE OF ULCERS (sore throat, sores in mouth, or a toothache) UNUSUAL RASH, SWELLING OR PAIN  UNUSUAL VAGINAL DISCHARGE OR ITCHING   Items with * indicate a potential emergency and should be followed up as soon as possible or go to the Emergency Department if any problems should occur.  Please show the CHEMOTHERAPY ALERT CARD or IMMUNOTHERAPY ALERT CARD at check-in to the  Emergency Department and triage nurse.  Should you have questions after your visit or need to cancel or reschedule your appointment, please contact [facility] [phone]  and follow the prompts.  Office hours are 8:00 a.m. to 4:30 p.m. Monday - Friday. Please note that voicemails left after 4:00 p.m. may not be returned until the following business day.  We are closed weekends and major holidays. You have access to a nurse at all times for urgent questions. Please call the main number to the clinic [phone] and follow the prompts.  For any non-urgent questions, you may also contact your provider using MyChart. We now offer e-Visits for anyone 64 and older to request care online for non-urgent symptoms. For details visit [email or web address].   Also download the MyChart app! Go to the app store, search "MyChart", open the app, select [facility], and log in with your MyChart username and password.  Due to Covid, a mask is required upon entering the hospital/clinic. If you do not have a mask, one will be given to you upon arrival. For doctor visits, patients may have 1 support person aged 64 or older with them. For treatment visits, patients cannot have anyone with them due to current Covid guidelines and our immunocompromised population.

## 2020-10-08 ENCOUNTER — Other Ambulatory Visit: Payer: Self-pay | Admitting: Hematology and Oncology

## 2020-10-08 ENCOUNTER — Encounter (HOSPITAL_COMMUNITY): Payer: Self-pay

## 2020-10-08 ENCOUNTER — Inpatient Hospital Stay (HOSPITAL_COMMUNITY): Payer: Medicare Other

## 2020-10-08 VITALS — BP 108/66 | HR 68 | Temp 97.1°F | Resp 18

## 2020-10-08 DIAGNOSIS — C3492 Malignant neoplasm of unspecified part of left bronchus or lung: Secondary | ICD-10-CM

## 2020-10-08 DIAGNOSIS — Z5111 Encounter for antineoplastic chemotherapy: Secondary | ICD-10-CM | POA: Diagnosis not present

## 2020-10-08 MED ORDER — SODIUM CHLORIDE 0.9 % IV SOLN
10.0000 mg | Freq: Once | INTRAVENOUS | Status: AC
  Administered 2020-10-08: 10 mg via INTRAVENOUS
  Filled 2020-10-08: qty 10

## 2020-10-08 MED ORDER — HEPARIN SOD (PORK) LOCK FLUSH 100 UNIT/ML IV SOLN
500.0000 [IU] | Freq: Once | INTRAVENOUS | Status: AC | PRN
  Administered 2020-10-08: 500 [IU]

## 2020-10-08 MED ORDER — POTASSIUM CHLORIDE IN NACL 20-0.9 MEQ/L-% IV SOLN
Freq: Once | INTRAVENOUS | Status: AC
  Filled 2020-10-08: qty 1000

## 2020-10-08 MED ORDER — MAGNESIUM SULFATE 2 GM/50ML IV SOLN
INTRAVENOUS | Status: AC
  Filled 2020-10-08: qty 50

## 2020-10-08 MED ORDER — SODIUM CHLORIDE 0.9 % IV SOLN: Freq: Once | INTRAVENOUS | Status: AC

## 2020-10-08 MED ORDER — SODIUM CHLORIDE 0.9% FLUSH
10.0000 mL | INTRAVENOUS | Status: DC | PRN
  Administered 2020-10-08 (×2): 10 mL

## 2020-10-08 MED ORDER — SODIUM CHLORIDE 0.9 % IV SOLN
100.0000 mg/m2 | Freq: Once | INTRAVENOUS | Status: AC
  Administered 2020-10-08: 180 mg via INTRAVENOUS
  Filled 2020-10-08: qty 9

## 2020-10-08 MED ORDER — MAGNESIUM SULFATE 2 GM/50ML IV SOLN
2.0000 g | Freq: Once | INTRAVENOUS | Status: AC
  Administered 2020-10-08: 2 g via INTRAVENOUS

## 2020-10-08 NOTE — Patient Instructions (Signed)
[facility]  Discharge Instructions: Thank you for choosing [facility] to provide your oncology and hematology care.  If you have a lab appointment with the [facility], please come in thru the Main Entrance and check in at the main information desk.  Wear comfortable clothing and clothing appropriate for easy access to any Portacath or PICC line.   We strive to give you quality time with your provider. You may need to reschedule your appointment if you arrive late (15 or more minutes).  Arriving late affects you and other patients whose appointments are after yours.  Also, if you miss three or more appointments without notifying the office, you may be dismissed from the clinic at the provider's discretion.      For prescription refill requests, have your pharmacy contact our office and allow 72 hours for refills to be completed.    Today you received the following chemotherapy and/or immunotherapy agents Etoposide.       To help prevent nausea and vomiting after your treatment, we encourage you to take your nausea medication as directed.  BELOW ARE SYMPTOMS THAT SHOULD BE REPORTED IMMEDIATELY: *FEVER GREATER THAN 100.4 F (38 C) OR HIGHER *CHILLS OR SWEATING *NAUSEA AND VOMITING THAT IS NOT CONTROLLED WITH YOUR NAUSEA MEDICATION *UNUSUAL SHORTNESS OF BREATH *UNUSUAL BRUISING OR BLEEDING *URINARY PROBLEMS (pain or burning when urinating, or frequent urination) *BOWEL PROBLEMS (unusual diarrhea, constipation, pain near the anus) TENDERNESS IN MOUTH AND THROAT WITH OR WITHOUT PRESENCE OF ULCERS (sore throat, sores in mouth, or a toothache) UNUSUAL RASH, SWELLING OR PAIN  UNUSUAL VAGINAL DISCHARGE OR ITCHING   Items with * indicate a potential emergency and should be followed up as soon as possible or go to the Emergency Department if any problems should occur.  Please show the CHEMOTHERAPY ALERT CARD or IMMUNOTHERAPY ALERT CARD at check-in to the Emergency  Department and triage nurse.  Should you have questions after your visit or need to cancel or reschedule your appointment, please contact [facility] [phone]  and follow the prompts.  Office hours are 8:00 a.m. to 4:30 p.m. Monday - Friday. Please note that voicemails left after 4:00 p.m. may not be returned until the following business day.  We are closed weekends and major holidays. You have access to a nurse at all times for urgent questions. Please call the main number to the clinic [phone] and follow the prompts.  For any non-urgent questions, you may also contact your provider using MyChart. We now offer e-Visits for anyone 45 and older to request care online for non-urgent symptoms. For details visit [email or web address].   Also download the MyChart app! Go to the app store, search "MyChart", open the app, select [facility], and log in with your MyChart username and password.  Due to Covid, a mask is required upon entering the hospital/clinic. If you do not have a mask, one will be given to you upon arrival. For doctor visits, patients may have 1 support person aged 87 or older with them. For treatment visits, patients cannot have anyone with them due to current Covid guidelines and our immunocompromised population.

## 2020-10-08 NOTE — Progress Notes (Signed)
Patient presents today for Etoposide C1D2. Patient to receive House Fluids with d2 and d3 treatment per Dr. Delton Coombes secure chat orders from 10/07/20. Patient states, " I slept better than I have in a long time last night." Patient has no complaints of pain today and side effects from treatment. Patient has not had a BM since 10/02/20 per patient's words. Patient denies pain and states he is passing gas per patient's words. Patient teaching performed. Dr. Delton Coombes aware and Miralax administration discussed with Dr. Delton Coombes per patient's words.   Treatment given today per MD orders. Tolerated infusion without adverse affects. Vital signs stable. No complaints at this time. Discharged from clinic ambulatory in stable condition. Alert and oriented x 3. F/U with [facility] as scheduled.

## 2020-10-09 ENCOUNTER — Other Ambulatory Visit: Payer: Self-pay

## 2020-10-09 ENCOUNTER — Inpatient Hospital Stay (HOSPITAL_COMMUNITY): Payer: Medicare Other

## 2020-10-09 VITALS — BP 131/88 | HR 66 | Temp 97.7°F | Resp 18

## 2020-10-09 DIAGNOSIS — C3492 Malignant neoplasm of unspecified part of left bronchus or lung: Secondary | ICD-10-CM

## 2020-10-09 DIAGNOSIS — Z5111 Encounter for antineoplastic chemotherapy: Secondary | ICD-10-CM | POA: Diagnosis not present

## 2020-10-09 MED ORDER — KETOROLAC TROMETHAMINE 30 MG/ML IJ SOLN
30.0000 mg | Freq: Once | INTRAMUSCULAR | Status: AC
  Administered 2020-10-09: 30 mg via INTRAVENOUS
  Filled 2020-10-09: qty 1

## 2020-10-09 MED ORDER — HEPARIN SOD (PORK) LOCK FLUSH 100 UNIT/ML IV SOLN
500.0000 [IU] | Freq: Once | INTRAVENOUS | Status: AC | PRN
  Administered 2020-10-09: 500 [IU]

## 2020-10-09 MED ORDER — SODIUM CHLORIDE 0.9 % IV SOLN: Freq: Once | INTRAVENOUS | Status: AC

## 2020-10-09 MED ORDER — SODIUM CHLORIDE 0.9% FLUSH
10.0000 mL | INTRAVENOUS | Status: DC | PRN
  Administered 2020-10-09: 10 mL

## 2020-10-09 MED ORDER — POTASSIUM CHLORIDE IN NACL 20-0.9 MEQ/L-% IV SOLN
Freq: Once | INTRAVENOUS | Status: AC
  Filled 2020-10-09: qty 1000

## 2020-10-09 MED ORDER — MAGNESIUM SULFATE 2 GM/50ML IV SOLN: 2.0000 g | Freq: Once | INTRAVENOUS | Status: AC

## 2020-10-09 MED ORDER — SODIUM CHLORIDE 0.9 % IV SOLN
10.0000 mg | Freq: Once | INTRAVENOUS | Status: AC
  Administered 2020-10-09: 10 mg via INTRAVENOUS
  Filled 2020-10-09: qty 10

## 2020-10-09 MED ORDER — MORPHINE SULFATE (PF) 2 MG/ML IV SOLN
INTRAVENOUS | Status: AC
  Filled 2020-10-09: qty 1

## 2020-10-09 MED ORDER — MAGNESIUM SULFATE 2 GM/50ML IV SOLN
INTRAVENOUS | Status: AC
  Administered 2020-10-09: 2 g via INTRAVENOUS
  Filled 2020-10-09: qty 50

## 2020-10-09 MED ORDER — MORPHINE SULFATE (PF) 2 MG/ML IV SOLN
2.0000 mg | INTRAVENOUS | Status: DC | PRN
Start: 2020-10-09 — End: 2020-10-09
  Administered 2020-10-09: 2 mg via INTRAVENOUS

## 2020-10-09 MED ORDER — SODIUM CHLORIDE 0.9 % IV SOLN
100.0000 mg/m2 | Freq: Once | INTRAVENOUS | Status: AC
  Administered 2020-10-09: 180 mg via INTRAVENOUS
  Filled 2020-10-09: qty 9

## 2020-10-09 NOTE — Progress Notes (Signed)
Pt here today for Day 3 of VP-16. Vital signs stable for treatment. Pt c/o pain 7/10 pt stated pain was on posterior area on his left shoulder. Dr. Delton Coombes made aware. Message received from Dr. Raliegh Ip to give Morphine 2mg  prn IV every 2 hours and Toradol 30mg  IV x 1 dose.  0842- Morphine was given 0847 Toradol was given 0920- Pain was re-accessed and pt reported pain rate 2/10 and stated his pain has resolved pt resting easily and voiced no new complaints at this time.  Treatment given today per MD orders. Tolerated infusion without adverse affects. Vital signs stable. No complaints at this time. Discharged from clinic ambulatory in stable condition. Alert and oriented x 3. F/U with [facility] as scheduled.

## 2020-10-09 NOTE — Patient Instructions (Signed)
[facility]  Discharge Instructions: Thank you for choosing [facility] to provide your oncology and hematology care.  If you have a lab appointment with the [name], please come in thru the Main Entrance and check in at the main information desk.  Wear comfortable clothing and clothing appropriate for easy access to any Portacath or PICC line.   We strive to give you quality time with your provider. You may need to reschedule your appointment if you arrive late (15 or more minutes).  Arriving late affects you and other patients whose appointments are after yours.  Also, if you miss three or more appointments without notifying the office, you may be dismissed from the clinic at the provider's discretion.      For prescription refill requests, have your pharmacy contact our office and allow 72 hours for refills to be completed.    Today you received the following chemotherapy and/or immunotherapy agents VP-16.   To help prevent nausea and vomiting after your treatment, we encourage you to take your nausea medication as directed.  BELOW ARE SYMPTOMS THAT SHOULD BE REPORTED IMMEDIATELY: *FEVER GREATER THAN 100.4 F (38 C) OR HIGHER *CHILLS OR SWEATING *NAUSEA AND VOMITING THAT IS NOT CONTROLLED WITH YOUR NAUSEA MEDICATION *UNUSUAL SHORTNESS OF BREATH *UNUSUAL BRUISING OR BLEEDING *URINARY PROBLEMS (pain or burning when urinating, or frequent urination) *BOWEL PROBLEMS (unusual diarrhea, constipation, pain near the anus) TENDERNESS IN MOUTH AND THROAT WITH OR WITHOUT PRESENCE OF ULCERS (sore throat, sores in mouth, or a toothache) UNUSUAL RASH, SWELLING OR PAIN  UNUSUAL VAGINAL DISCHARGE OR ITCHING   Items with * indicate a potential emergency and should be followed up as soon as possible or go to the Emergency Department if any problems should occur.  Please show the CHEMOTHERAPY ALERT CARD or IMMUNOTHERAPY ALERT CARD at check-in to the Emergency Department  and triage nurse.  Should you have questions after your visit or need to cancel or reschedule your appointment, please contact [facility] [phone]  and follow the prompts.  Office hours are 8:00 a.m. to 4:30 p.m. Monday - Friday. Please note that voicemails left after 4:00 p.m. may not be returned until the following business day.  We are closed weekends and major holidays. You have access to a nurse at all times for urgent questions. Please call the main number to the clinic [phone] and follow the prompts.  For any non-urgent questions, you may also contact your provider using MyChart. We now offer e-Visits for anyone 23 and older to request care online for non-urgent symptoms. For details visit [email or web address].   Also download the MyChart app! Go to the app store, search "MyChart", open the app, select [facility], and log in with your MyChart username and password.  Due to Covid, a mask is required upon entering the hospital/clinic. If you do not have a mask, one will be given to you upon arrival. For doctor visits, patients may have 1 support person aged 26 or older with them. For treatment visits, patients cannot have anyone with them due to current Covid guidelines and our immunocompromised population.

## 2020-10-10 NOTE — Progress Notes (Signed)
24 hour follow up: Patient denies any nausea, fatigue, diarrhea. Patient's only complaint is  pain that he rates a 7/10. Patient reports his pain in the Left shoulder.  Patient taking Gabapentin and Methodone as precribed. Patient states his appetite has increased due to the steroids. Patient has issues with constipation and reports a bowel movement this morning after two days of using Miralax. Patient teaching performed pertaining to pain management. Understanding verbalized.

## 2020-10-13 NOTE — Progress Notes (Deleted)
[facility] [address] [place] [address] Phone: [phone] Fax: [facility] PROGRESS NOTE   Miguel Colon [id number] 1955/11/13 65 y.o.  Gilmore List is managed by Dr. Delton Coombes for stage IIIa small cell lung cancer.  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 Delton Coombes for stage IIIa small cell lung cancer.  He received his first cycle of chemotherapy with cisplatin and etoposide from from 10/07/2020 through 10/09/2020.  PET scan (10/02/2020) showed 8 cm left apical lung mass highly suspicious for Pancoast tumor with chest wall invasion, MR C-spine with left apical lung mass involving the left brachial plexus with severe spinal stenosis at C3-C4.  This is causing the patient severe left arm and shoulder pain.  At his visit with Dr. Delton Coombes last week methadone was increased to 10 mg twice daily with the addition of gabapentin 300 mg 3 times daily.  At today's visit, he reports that his pain is ***  Regarding his chemotherapy last week, he reports that he tolerated it ***.  He did have an episode of severe worsening of the pain in his left shoulder and arm during chemotherapy infusion, required a total of 6 mg IV morphine, 2 mg Dilaudid, and ketorolac 30 mg IV in order to control pain. *** Nausea, vomiting, diarrhea? *** Ringing in the ears or decreased hearing? *** Changes in urine output? *** Constipation?  *** Appetite and oral intake? *** Hydration status? *** Weight?  (Lost 15 pounds in 2 months)   Review of Systems: *** Review of Systems   Past Medical  History, Surgical history, Social history, and Family history were reviewed as documented elsewhere in chart, and were updated as appropriate.   Objective:   Physical Exam: *** There were no vitals taken for this visit. ECOG: ***  Physical Exam  Lab Review:     Component Value Date/Time   NA 139 10/07/2020 0808   NA 143 09/24/2014 0000   K 3.9 10/07/2020 0808   CL 104 10/07/2020 0808   CO2 30 10/07/2020 0808   GLUCOSE 106 (H) 10/07/2020 0808   BUN 17 10/07/2020 0808   BUN 17 09/24/2014 0000   CREATININE 1.10 10/07/2020 0808   CALCIUM 8.0 (L) 10/07/2020 0808   PROT 7.2 10/07/2020 0808   ALBUMIN 3.2 (L) 10/07/2020 0808   AST 16 10/07/2020 0808   ALT 12 10/07/2020 0808   ALKPHOS 46 10/07/2020 0808   BILITOT 0.5 10/07/2020 0808   GFRNONAA >60 10/07/2020 0808   GFRAA >60 12/28/2018 1013       Component Value Date/Time   WBC 4.9 10/07/2020 0808   RBC 3.40 (L) 10/07/2020 0808   HGB 11.3 (L) 10/07/2020 0808   HCT 34.0 (L) 10/07/2020 0808   PLT 367 10/07/2020 0808   MCV 100.0 10/07/2020 0808   MCH 33.2 10/07/2020 0808   MCHC 33.2 10/07/2020 0808   RDW 14.8 10/07/2020 0808   LYMPHSABS 1.8 10/07/2020 0808   MONOABS 0.7 10/07/2020 0808   EOSABS 0.2 10/07/2020 0808   BASOSABS 0.0 10/07/2020 0808   -------------------------------  Imaging from last 24 hours (if applicable):  Radiology interpretation: NM PET Image Initial (PI) Skull Base To Thigh  Result Date: 10/04/2020 CLINICAL DATA:  Initial treatment strategy for small cell lung cancer. EXAM: NUCLEAR MEDICINE PET SKULL BASE TO THIGH TECHNIQUE: 7.8 mCi F-18 FDG was injected intravenously. Full-ring PET imaging was performed from the skull base to thigh after the radiotracer. CT data was obtained and used for attenuation correction and anatomic localization. Fasting blood glucose: 94 mg/dl COMPARISON:  Prior CT scans. FINDINGS: Mediastinal blood pool activity: SUV max 2.24 Liver activity: SUV max NA NECK: No hypermetabolic  lymph nodes in the neck. Incidental CT findings: none CHEST: Large left apical lung mass is hypermetabolic with SUV max of 82.42. Findings are highly suspicious for chest wall invasion. There appears to be clear involvement of the apical anterior chest wall musculature (scalene muscle). Highly likely that there is also involvement of the left first and second ribs. MRI chest without and with contrast may be helpful for more definitive evaluation if clinically necessary. No enlarged or hypermetabolic mediastinal hilar lymph nodes. No supraclavicular or axillary adenopathy. No hypermetabolic pulmonary nodules. The 10.5 mm left lower lobe nodule on image number 151/3 does not show any FDG uptake. Incidental CT findings: Stable advanced emphysematous changes and pulmonary scarring. Stable densely calcified right upper lobe granuloma. ABDOMEN/PELVIS: No abnormal hypermetabolic activity within the liver, pancreas, adrenal glands, or spleen. No hypermetabolic lymph nodes in the abdomen or pelvis. Incidental CT findings: Age advanced atherosclerotic calcifications involving the aorta and iliac arteries. Moderate stool burden. SKELETON: No focal hypermetabolic activity to suggest skeletal metastasis. Mild uptake around the left shoulder posterior is likely inflammatory change. Incidental CT findings: none IMPRESSION: 1. 8 cm left apical lung mass is hypermetabolic and consistent with known small cell lung cancer. 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 2. No findings suspicious for mediastinal/hilar adenopathy or metastatic disease involving the abdomen/pelvis or bony structures. 3. No FDG uptake in the left lower lobe pulmonary nodule. 4. Stable age advanced emphysematous changes, pulmonary scarring and vascular disease. Electronically Signed   By: Marijo Sanes M.D.   On: 10/04/2020 14:33   IR IMAGING GUIDED PORT INSERTION  Result Date: 09/30/2020 INDICATION:  65 year old male with lung cancer, recommend for chemotherapy EXAM: IMPLANTED PORT A CATH PLACEMENT WITH ULTRASOUND AND FLUOROSCOPIC GUIDANCE MEDICATIONS: None. ANESTHESIA/SEDATION: Moderate (conscious) sedation was employed during this procedure. A total of Versed 1.5 mg and Fentanyl 50 mcg was administered intravenously. Moderate Sedation Time: 27 minutes. The patient's level of consciousness and vital signs were monitored continuously by radiology nursing throughout the procedure under my direct supervision. FLUOROSCOPY TIME:  0 minutes, 6 seconds (1 mGy) COMPLICATIONS: None immediate. PROCEDURE: The procedure, risks, benefits, and alternatives were explained to the patient. Questions regarding the procedure were encouraged and answered. The patient understands and consents to the procedure. The right neck and chest were prepped with chlorhexidine in a sterile fashion, and a sterile drape was applied covering the operative field. Maximum barrier sterile technique with sterile gowns and gloves were used for the procedure. A timeout was performed prior to the initiation of the procedure. Local anesthesia was provided with 1% lidocaine with epinephrine. After creating a small venotomy incision, a micropuncture kit was utilized to access the internal jugular vein under direct, real-time ultrasound guidance. Ultrasound image documentation was performed. The microwire was kinked to measure appropriate catheter length. A subcutaneous port pocket was then created along the upper chest wall utilizing a combination of sharp and blunt dissection. The pocket was irrigated with sterile saline. A single  lumen ISP power injectable port was chosen for placement. The 8 Fr catheter was tunneled from the port pocket site to the venotomy incision. The port was placed in the pocket. The external catheter was trimmed to appropriate length. At the venotomy, an 8 Fr peel-away sheath was placed over a guidewire under fluoroscopic  guidance. The catheter was then placed through the sheath and the sheath was removed. Final catheter positioning was confirmed and documented with a fluoroscopic spot radiograph. The port was accessed with a Huber needle, aspirated and flushed with heparinized saline. The port pocket incision was closed with interrupted 3-0 Vicryl suture then Dermabond was applied, including at the venotomy incision. Dressings were placed. The patient tolerated the procedure well without immediate post procedural complication. IMPRESSION: Successful placement of a right internal jugular approach power-injectable Port-A-Cath. The catheter is ready for immediate use. Michaelle Birks, MD [facility] Radiology Electronically Signed   By: Michaelle Birks MD   On: 09/30/2020 16:45      Assessment & Plan:    1.  Left arm/shoulder pain secondary to Pancoast tumor of left lung -Currently taking methadone 10 mg twice daily as well as gabapentin 300 mg 3 times daily - PLAN: ***  2.  Chemotherapy toxicity check - We once again discussed the various side effects of his chemotherapy regimens including but not limited to cytopenias, increased risk of infection, nephrotoxicity, ototoxicity, and neuropathy among others - *** Nausea/vomiting/diarrhea? - *** Neuropathy - *** Ringing in ears or decreased hearing? - *** B symptoms? - *** Appetite, hydration status, weight? - *** Labs?  (Cytopenias?  Nephrotoxicity? - *** - PLAN: ***  3.  Constipation: - Likely opioid induced. - Recommend Movantik 25 mg daily. - Recommend starting Senokot 2 tablets daily. - PLAN: ***   PLAN SUMMARY & DISPOSITION: ***  All questions were answered. The patient knows to call the clinic with any problems, questions or concerns.  Medical decision making: ***  Time spent on visit: I spent {CHL ONC TIME VISIT - [id number]} counseling the patient face to face. The total time spent in the appointment  was {CHL ONC TIME VISIT - KUVJD:[id number]} and more than 50% was on counseling.   Harriett Rush, PA-C  ***

## 2020-10-14 ENCOUNTER — Inpatient Hospital Stay (HOSPITAL_COMMUNITY): Payer: Medicare Other | Admitting: Physician Assistant

## 2020-10-14 ENCOUNTER — Inpatient Hospital Stay (HOSPITAL_COMMUNITY): Payer: Medicare Other

## 2020-10-14 ENCOUNTER — Other Ambulatory Visit: Payer: Self-pay

## 2020-10-14 ENCOUNTER — Inpatient Hospital Stay (HOSPITAL_BASED_OUTPATIENT_CLINIC_OR_DEPARTMENT_OTHER): Payer: Medicare Other | Admitting: Physician Assistant

## 2020-10-14 VITALS — BP 102/64 | HR 60 | Temp 96.7°F | Resp 17 | Wt 145.0 lb

## 2020-10-14 DIAGNOSIS — N179 Acute kidney failure, unspecified: Secondary | ICD-10-CM

## 2020-10-14 DIAGNOSIS — C3412 Malignant neoplasm of upper lobe, left bronchus or lung: Secondary | ICD-10-CM

## 2020-10-14 DIAGNOSIS — C3492 Malignant neoplasm of unspecified part of left bronchus or lung: Secondary | ICD-10-CM | POA: Diagnosis not present

## 2020-10-14 DIAGNOSIS — E039 Hypothyroidism, unspecified: Secondary | ICD-10-CM | POA: Diagnosis not present

## 2020-10-14 DIAGNOSIS — G893 Neoplasm related pain (acute) (chronic): Secondary | ICD-10-CM

## 2020-10-14 DIAGNOSIS — C3490 Malignant neoplasm of unspecified part of unspecified bronchus or lung: Secondary | ICD-10-CM

## 2020-10-14 DIAGNOSIS — Z5111 Encounter for antineoplastic chemotherapy: Secondary | ICD-10-CM | POA: Diagnosis not present

## 2020-10-14 LAB — COMPREHENSIVE METABOLIC PANEL
ALT: 15 U/L (ref 0–44)
AST: 13 U/L — ABNORMAL LOW (ref 15–41)
Albumin: 2.9 g/dL — ABNORMAL LOW (ref 3.5–5.0)
Alkaline Phosphatase: 39 U/L (ref 38–126)
Anion gap: 5 (ref 5–15)
BUN: 33 mg/dL — ABNORMAL HIGH (ref 8–23)
CO2: 32 mmol/L (ref 22–32)
Calcium: 8.5 mg/dL — ABNORMAL LOW (ref 8.9–10.3)
Chloride: 99 mmol/L (ref 98–111)
Creatinine, Ser: 1.78 mg/dL — ABNORMAL HIGH (ref 0.61–1.24)
GFR, Estimated: 42 mL/min — ABNORMAL LOW (ref >60)
Glucose, Bld: 94 mg/dL (ref 70–99)
Potassium: 4 mmol/L (ref 3.5–5.1)
Sodium: 136 mmol/L (ref 135–145)
Total Bilirubin: 0.4 mg/dL (ref 0.3–1.2)
Total Protein: 6.6 g/dL (ref 6.5–8.1)

## 2020-10-14 LAB — CBC WITH DIFFERENTIAL/PLATELET
Abs Immature Granulocytes: 0.05 10*3/uL (ref 0.00–0.07)
Basophils Absolute: 0 10*3/uL (ref 0.0–0.1)
Basophils Relative: 0 %
Eosinophils Absolute: 0 10*3/uL (ref 0.0–0.5)
Eosinophils Relative: 1 %
HCT: 31.7 % — ABNORMAL LOW (ref 39.0–52.0)
Hemoglobin: 10.4 g/dL — ABNORMAL LOW (ref 13.0–17.0)
Immature Granulocytes: 1 %
Lymphocytes Relative: 27 %
Lymphs Abs: 1.1 10*3/uL (ref 0.7–4.0)
MCH: 32.6 pg (ref 26.0–34.0)
MCHC: 32.8 g/dL (ref 30.0–36.0)
MCV: 99.4 fL (ref 80.0–100.0)
Monocytes Absolute: 0.1 10*3/uL (ref 0.1–1.0)
Monocytes Relative: 3 %
Neutro Abs: 2.6 10*3/uL (ref 1.7–7.7)
Neutrophils Relative %: 68 %
Platelets: 287 10*3/uL (ref 150–400)
RBC: 3.19 MIL/uL — ABNORMAL LOW (ref 4.22–5.81)
RDW: 14.6 % (ref 11.5–15.5)
WBC: 3.9 10*3/uL — ABNORMAL LOW (ref 4.0–10.5)
nRBC: 0 % (ref 0.0–0.2)

## 2020-10-14 LAB — MAGNESIUM: Magnesium: 1.8 mg/dL (ref 1.7–2.4)

## 2020-10-14 MED ORDER — LEVOTHYROXINE SODIUM 125 MCG PO TABS: 250.0000 ug | ORAL_TABLET | Freq: Every day | ORAL | 3 refills | Status: DC

## 2020-10-14 MED ORDER — SODIUM CHLORIDE 0.9 % IV SOLN
INTRAVENOUS | Status: DC
Start: 2020-10-14 — End: 2020-10-15

## 2020-10-14 MED ORDER — HEPARIN SOD (PORK) LOCK FLUSH 100 UNIT/ML IV SOLN
500.0000 [IU] | Freq: Once | INTRAVENOUS | Status: AC
  Administered 2020-10-14: 500 [IU] via INTRAVENOUS

## 2020-10-14 MED ORDER — METHADONE HCL 10 MG PO TABS
10.0000 mg | ORAL_TABLET | Freq: Three times a day (TID) | ORAL | 0 refills | Status: DC
Start: 2020-10-14 — End: 2022-06-21

## 2020-10-14 NOTE — Progress Notes (Signed)
One liter of fluids given per orders. Patient tolerated it well without problems. Vitals stable and discharged home from clinic ambulatory. Follow up as scheduled.

## 2020-10-14 NOTE — Progress Notes (Signed)
[facility] [address] [place] [address] Phone: [phone] Fax: [place] PROGRESS NOTE   Miguel Colon [id number] 1955-04-07 65 y.o.   Miguel Colon is managed by Dr. Delton Coombes for stage IIIa small cell lung cancer.  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 Delton Coombes for stage IIIa small cell lung cancer.  He received his first cycle of chemotherapy with cisplatin and etoposide from from 10/07/2020 through 10/09/2020.  PET scan (10/02/2020) showed 8 cm left apical lung mass highly suspicious for Pancoast tumor with chest wall invasion, MR C-spine with left apical lung mass involving the left brachial plexus with severe spinal stenosis at C3-C4.  This is causing the patient severe left arm and shoulder pain.  At his visit with Dr. Delton Coombes last week methadone was increased to 10 mg twice daily with the addition of gabapentin 300 mg 3 times daily.  Patient reports that he tolerated chemotherapy last week fairly well.  He did have an episode of severe worsening of the pain in his left shoulder and arm during chemotherapy infusion, and required a total of 6 mg of IV morphine, 2 mg of Dilaudid, as well as ketorolac 30 mg IV in order to control pain.  He reports that he slept a lot for the first day or 2 after chemotherapy, but since that time has not had any fatigue or any other adverse symptoms.  He denies any nausea, vomiting, diarrhea.  No tinnitus or decreased hearing.  He has not noted any changes in urine output.  He reports excellent appetite, reports that he is "eating  better than he has in a while."  He has been trying to increase his hydration, and estimates that he drinks about 2.5 L of water per day for the past week.  He denies any weight loss, and weight check at the clinic shows that his weight is up about 2 pounds since his last visit.  He reports that he continues to have severe persistent pain from his Pancoast tumor.  He reports that the 10 mg of methadone is decreasing his pain to a manageable level (from 8/10 down to 6/10), but that the effect only lasts a few hours.  He reports that the methadone helps with the localized pain in his left shoulder, but does not affect the shooting neuropathic pain spreading down his left arm.  This neuropathic pain has continued without change despite being started on gabapentin.  He continues to have numbness in the ulnar nerve distribution of his left arm, and is noted some hand weakness and decreased grip strength.   His opioid-induced constipation has improved after he started taking Movantik.  He reports that he had 1 large bowel movement that felt like "getting rid of a load of bricks," and that he now has a soft bowel movement about once per day.  He denies any watery stool or diarrhea.   Review of Systems:  Review of Systems  Constitutional:  Negative for appetite change, chills, diaphoresis, fatigue, fever and unexpected weight change.  HENT:  Negative for hearing loss, tinnitus and trouble swallowing.   Respiratory:  Negative for cough and shortness of breath.  Cardiovascular:  Positive for chest pain (Left upper chest wall / shoulder).  Gastrointestinal:  Positive for constipation (improved with Movantik). Negative for abdominal pain, diarrhea, nausea and vomiting.  Genitourinary:  Negative for decreased urine volume.  Musculoskeletal:  Positive for arthralgias (left shoulder / arm pain).  Neurological:  Positive for weakness (weakness of left hand) and numbness (tingling in hands and feet). Negative for  headaches.    Past Medical History, Surgical history, Social history, and Family history were reviewed as documented elsewhere in chart, and were updated as appropriate.   Objective:   Physical Exam:  There were no vitals taken for this visit. ECOG: 1  Physical Exam Constitutional:      Appearance: Normal appearance.  HENT:     Head: Normocephalic and atraumatic.     Mouth/Throat:     Mouth: Mucous membranes are moist.  Eyes:     Extraocular Movements: Extraocular movements intact.     Pupils: Pupils are equal, round, and reactive to light.  Cardiovascular:     Rate and Rhythm: Normal rate and regular rhythm.     Pulses: Normal pulses.     Heart sounds: Normal heart sounds.  Pulmonary:     Effort: Pulmonary effort is normal.     Breath sounds: Decreased air movement present. Decreased breath sounds present.  Abdominal:     General: Bowel sounds are normal.     Palpations: Abdomen is soft.     Tenderness: There is no abdominal tenderness.  Musculoskeletal:        General: No swelling.     Right lower leg: No edema.     Left lower leg: No edema.  Lymphadenopathy:     Cervical: No cervical adenopathy.  Skin:    General: Skin is warm and dry.  Neurological:     General: No focal deficit present.     Mental Status: He is alert and oriented to person, place, and time.  Psychiatric:        Mood and Affect: Mood normal.        Behavior: Behavior normal.    Lab Review:     Component Value Date/Time   NA 139 10/07/2020 0808   NA 143 09/24/2014 0000   K 3.9 10/07/2020 0808   CL 104 10/07/2020 0808   CO2 30 10/07/2020 0808   GLUCOSE 106 (H) 10/07/2020 0808   BUN 17 10/07/2020 0808   BUN 17 09/24/2014 0000   CREATININE 1.10 10/07/2020 0808   CALCIUM 8.0 (L) 10/07/2020 0808   PROT 7.2 10/07/2020 0808   ALBUMIN 3.2 (L) 10/07/2020 0808   AST 16 10/07/2020 0808   ALT 12 10/07/2020 0808   ALKPHOS 46 10/07/2020 0808   BILITOT 0.5 10/07/2020 0808   GFRNONAA >60  10/07/2020 0808   GFRAA >60 12/28/2018 1013       Component Value Date/Time   WBC 4.9 10/07/2020 0808   RBC 3.40 (L) 10/07/2020 0808   HGB 11.3 (L) 10/07/2020 0808   HCT 34.0 (L) 10/07/2020 0808   PLT 367 10/07/2020 0808   MCV 100.0 10/07/2020 0808   MCH 33.2 10/07/2020 0808   MCHC 33.2 10/07/2020 0808   RDW 14.8 10/07/2020 0808   LYMPHSABS 1.8 10/07/2020 0808   MONOABS 0.7 10/07/2020 0808   EOSABS 0.2 10/07/2020 0808   BASOSABS 0.0 10/07/2020 0808   -------------------------------  Imaging from last 24 hours (if applicable):  Radiology interpretation: NM PET Image Initial (PI) Skull Base To Thigh  Result Date: 10/04/2020 CLINICAL  DATA:  Initial treatment strategy for small cell lung cancer. EXAM: NUCLEAR MEDICINE PET SKULL BASE TO THIGH TECHNIQUE: 7.8 mCi F-18 FDG was injected intravenously. Full-ring PET imaging was performed from the skull base to thigh after the radiotracer. CT data was obtained and used for attenuation correction and anatomic localization. Fasting blood glucose: 94 mg/dl COMPARISON:  Prior CT scans. FINDINGS: Mediastinal blood pool activity: SUV max 2.24 Liver activity: SUV max NA NECK: No hypermetabolic lymph nodes in the neck. Incidental CT findings: none CHEST: Large left apical lung mass is hypermetabolic with SUV max of 24.40. Findings are highly suspicious for chest wall invasion. There appears to be clear involvement of the apical anterior chest wall musculature (scalene muscle). Highly likely that there is also involvement of the left first and second ribs. MRI chest without and with contrast may be helpful for more definitive evaluation if clinically necessary. No enlarged or hypermetabolic mediastinal hilar lymph nodes. No supraclavicular or axillary adenopathy. No hypermetabolic pulmonary nodules. The 10.5 mm left lower lobe nodule on image number 151/3 does not show any FDG uptake. Incidental CT findings: Stable advanced emphysematous changes and pulmonary  scarring. Stable densely calcified right upper lobe granuloma. ABDOMEN/PELVIS: No abnormal hypermetabolic activity within the liver, pancreas, adrenal glands, or spleen. No hypermetabolic lymph nodes in the abdomen or pelvis. Incidental CT findings: Age advanced atherosclerotic calcifications involving the aorta and iliac arteries. Moderate stool burden. SKELETON: No focal hypermetabolic activity to suggest skeletal metastasis. Mild uptake around the left shoulder posterior is likely inflammatory change. Incidental CT findings: none IMPRESSION: 1. 8 cm left apical lung mass is hypermetabolic and consistent with known small cell lung cancer. 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 2. No findings suspicious for mediastinal/hilar adenopathy or metastatic disease involving the abdomen/pelvis or bony structures. 3. No FDG uptake in the left lower lobe pulmonary nodule. 4. Stable age advanced emphysematous changes, pulmonary scarring and vascular disease. Electronically Signed   By: Marijo Sanes M.D.   On: 10/04/2020 14:33   IR IMAGING GUIDED PORT INSERTION  Result Date: 09/30/2020 INDICATION: 65 year old male with lung cancer, recommend for chemotherapy EXAM: IMPLANTED PORT A CATH PLACEMENT WITH ULTRASOUND AND FLUOROSCOPIC GUIDANCE MEDICATIONS: None. ANESTHESIA/SEDATION: Moderate (conscious) sedation was employed during this procedure. A total of Versed 1.5 mg and Fentanyl 50 mcg was administered intravenously. Moderate Sedation Time: 27 minutes. The patient's level of consciousness and vital signs were monitored continuously by radiology nursing throughout the procedure under my direct supervision. FLUOROSCOPY TIME:  0 minutes, 6 seconds (1 mGy) COMPLICATIONS: None immediate. PROCEDURE: The procedure, risks, benefits, and alternatives were explained to the patient. Questions regarding the procedure were encouraged and answered. The patient understands and  consents to the procedure. The right neck and chest were prepped with chlorhexidine in a sterile fashion, and a sterile drape was applied covering the operative field. Maximum barrier sterile technique with sterile gowns and gloves were used for the procedure. A timeout was performed prior to the initiation of the procedure. Local anesthesia was provided with 1% lidocaine with epinephrine. After creating a small venotomy incision, a micropuncture kit was utilized to access the internal jugular vein under direct, real-time ultrasound guidance. Ultrasound image documentation was performed. The microwire was kinked to measure appropriate catheter length. A subcutaneous port pocket was then created along the upper chest wall utilizing a combination of sharp and blunt dissection. The pocket was irrigated with sterile saline. A single lumen ISP power injectable  port was chosen for placement. The 8 Fr catheter was tunneled from the port pocket site to the venotomy incision. The port was placed in the pocket. The external catheter was trimmed to appropriate length. At the venotomy, an 8 Fr peel-away sheath was placed over a guidewire under fluoroscopic guidance. The catheter was then placed through the sheath and the sheath was removed. Final catheter positioning was confirmed and documented with a fluoroscopic spot radiograph. The port was accessed with a Huber needle, aspirated and flushed with heparinized saline. The port pocket incision was closed with interrupted 3-0 Vicryl suture then Dermabond was applied, including at the venotomy incision. Dressings were placed. The patient tolerated the procedure well without immediate post procedural complication. IMPRESSION: Successful placement of a right internal jugular approach power-injectable Port-A-Cath. The catheter is ready for immediate use. Michaelle Birks, MD [facility] Electronically Signed   By: Michaelle Birks  MD   On: 09/30/2020 16:45      Assessment & Plan:    1.  Left arm/shoulder pain secondary to Pancoast tumor of left lung -Currently taking methadone 10 mg twice daily as well as gabapentin 300 mg 3 times daily - He does not yet see any improvement in his neuropathic pain from the gabapentin - He reports that the methadone eases his pain, but does not last long enough - PLAN: Increase methadone to 10 mg three times daily.  Continue gabapentin 300 mg three times daily, and consider increased dose or alternative agent at next appointment.  2.  Acute kidney injury: - Labs today (10/14/2020) show creatinine 1.78, up from creatinine 1.10 one week ago - Likely secondary to cisplatin, but may also be AKI from NSAIDS (was given Toradol injection during treatment due to severe pain, also has prescription for Meloxicam) - Reports that he has been drinking 2.5 L of water per day - PLAN: Given 1 L NS during visit today.  Will return tomorrow for additional 1 L NS.  Meloxicam discontinued, instructed to avoid all NSAIDs.  Repeat labs and possible fluids on Monday 10/20/2020.  Patient instructed to drink 3 L of fluid daily.  3.  Constipation: - Likely opioid induced. - Continue Movantik 25 mg daily. - Senokot 2 tablets daily as needed. - PLAN: Continue Movantik with as needed Senokot.   All questions were answered. The patient knows to call the clinic with any problems, questions or concerns.  Medical decision making: Moderate  Time spent on visit: I spent 40 minutes counseling the patient face to face. The total time spent in the appointment was 55 minutes and more than 50% was on counseling.   Harriett Rush, PA-C  10/14/2020 12:42 PM

## 2020-10-14 NOTE — Progress Notes (Signed)
Radiation Oncology         ([phone]) [phone] ________________________________  Initial Outpatient Consultation  Name: Miguel Colon MRN: [id number]  Date: 10/15/2020  DOB: March 18, 1955  XN:ATFTDD, Langley Gauss, MD  Heath Lark, MD   REFERRING PHYSICIAN: Heath Lark, MD  DIAGNOSIS: The encounter diagnosis was Small cell lung cancer, left ([place]).  Stage IIB (T4, cN0, cM0) Small cell lung cancer, left ([place])  HISTORY OF PRESENT ILLNESS::Miguel Colon is a 65 y.o. male who is accompanied by no one. he is seen as a courtesy of Dr. Alvy Bimler for an opinion concerning radiation therapy as part of management for his recently diagnosed left lung cancer. The patient presented to the [facility] ED on 08/04/20 with left-sided arm tingling which began the night before, as well as left-sided chest pain with onset at around 5 AM that morning.  The patient reported that he felt very tired and winded even when walking to the fridge. He denied cough, shortness of breath, fever, or chills. Chest CT taken during ED course demonstrated a 5.5 cm mass at the left lung apex; suspicious for primary bronchogenic neoplasm/ pancoast tumor.   The patient soon after presented to the [facility] ED on 09/02/20 with increased numbness in his left upper extremity and severe pain to his left chest and left upper extremity. The patient was admitted for pain management. Dr. Christella Noa, (neurosurgery), stated that there was no indication for urgent surgical intervention.  He recommended management from oncology. MRI of cervical spine taken then demonstrated the known invasive left apical lung mass which likely involves the left brachial plexus. MRI of the brain also taken during this admission showed no acute intracranial abnormality, with the addition of mild chronic small vessel ischemic disease.   CT of the abdomen and pelvis taken on 09/04/20 demonstrated the left lung base nodule to be likely metastatic, however PET scan did not confirm this finding. no  metastatic disease was seen otherwise in the abdomen or pelvis.   Patient soon after underwent CT guided left upper lobe lung biopsy on 09/05/20. Pathology revealed: poorly differentiated carcinoma.  PET performed on 10/02/20 demonstrated the 8 cm left apical lung mass to be hypermetabolic and consistent with known small cell lung cancer. Overall findings were noted to be highly suspicious for pancoast tumor with chest wall invasion. Otherwise, no findings were suspicious for mediastinal/hilar adenopathy or metastatic disease involving the abdomen, pelvis, or bony structures.   MRI of the cervical spine back in early July showed the lung mass to likely involve the left brachial plexus.  The study however was degraded secondary to motion artifact.  The patient began chemotherapy on 10/07/20 with cisplatin and etoposide.    PREVIOUS RADIATION THERAPY: No  PAST MEDICAL HISTORY:  Past Medical History:  Diagnosis Date   Asthma    Back pain    Lung cancer ([place])    Migraines    Thyroid disease     PAST SURGICAL HISTORY: Past Surgical History:  Procedure Laterality Date   IR IMAGING GUIDED PORT INSERTION  09/30/2020   THYROIDECTOMY      FAMILY HISTORY:  Family History  Problem Relation Age of Onset   Hypertension Mother    Hypertension Father    Colon cancer Father 74    SOCIAL HISTORY:  Social History   Tobacco Use   Smoking status: Former    Packs/day: 0.50    Years: 47.00    Pack years: 23.50    Types: Cigarettes    Quit date: 09/02/2020  Years since quitting: 0.1   Smokeless tobacco: Never  Vaping Use   Vaping Use: Never used  Substance Use Topics   Alcohol use: No   Drug use: No    ALLERGIES: No Known Allergies  MEDICATIONS:  Current Outpatient Medications  Medication Sig Dispense Refill   CISPLATIN IV Inject into the vein every 21 ( twenty-one) days.     Ensure (ENSURE) Take 237 mLs by mouth daily.     ETOPOSIDE IV Inject into the vein every 21 (  twenty-one) days. Days 1-3 every 21 days     gabapentin (NEURONTIN) 300 MG capsule Take 1 capsule (300 mg total) by mouth 3 (three) times daily. 90 capsule 3   levothyroxine (SYNTHROID) 125 MCG tablet Take 2 tablets (250 mcg total) by mouth daily before breakfast. 60 tablet 3   lidocaine (LIDODERM) 5 % Place 1 patch onto the skin daily. Remove & Discard patch within 12 hours or as directed by MD 30 patch 0   lidocaine-prilocaine (EMLA) cream Apply to affected area once 30 g 3   methadone (DOLOPHINE) 10 MG tablet Take 1 tablet (10 mg total) by mouth every 8 (eight) hours. 90 tablet 0   naloxegol oxalate (MOVANTIK) 25 MG TABS tablet Take 1 tablet (25 mg total) by mouth daily. 30 tablet 3   nicotine (NICODERM CQ - DOSED IN MG/24 HOURS) 14 mg/24hr patch Place 1 patch (14 mg total) onto the skin daily. 28 patch 0   acetaminophen (TYLENOL) 500 MG tablet Take 2 tablets (1,000 mg total) by mouth 3 (three) times daily. (Patient not taking: Reported on 10/15/2020) 30 tablet 0   ondansetron (ZOFRAN) 8 MG tablet Take 1 tablet (8 mg total) by mouth 2 (two) times daily as needed. Start on the third day after cisplatin chemotherapy. (Patient not taking: Reported on 10/15/2020) 30 tablet 1   polyethylene glycol (MIRALAX) 17 g packet Take 17 g by mouth daily. (Patient not taking: Reported on 10/15/2020) 30 each 2   prochlorperazine (COMPAZINE) 10 MG tablet Take 1 tablet (10 mg total) by mouth every 6 (six) hours as needed (Nausea or vomiting). (Patient not taking: Reported on 10/15/2020) 30 tablet 1   senna (SENOKOT) 8.6 MG TABS tablet Take 2 tablets (17.2 mg total) by mouth 2 (two) times daily. (Patient not taking: Reported on 10/15/2020) 120 tablet 0   traZODone (DESYREL) 50 MG tablet Take 1 tablet (50 mg total) by mouth at bedtime. (Patient not taking: Reported on 10/15/2020) 30 tablet 3   No current facility-administered medications for this encounter.    REVIEW OF SYSTEMS:  A 10+ POINT REVIEW OF SYSTEMS WAS OBTAINED  including neurology, dermatology, psychiatry, cardiac, respiratory, lymph, extremities, GI, GU, musculoskeletal, constitutional, reproductive, HEENT.  He complains of severe pain in the left shoulder region.  He also complains of numbness along the fourth and fifth digit of his left hand and some general weakness with grip on his left side.   PHYSICAL EXAM:  height is '5\' 10"'  (1.778 m) and weight is 149 lb 4.8 oz (67.7 kg). His temperature is 98.1 F (36.7 C). His blood pressure is 108/76 and his pulse is 60. His respiration is 18 and oxygen saturation is 98%.   General: Alert and oriented, in no acute distress HEENT: Head is normocephalic. Extraocular movements are intact. Oropharynx is clear.  Ptosis of the left eye noted on exam. Neck: Neck is supple, no palpable cervical or supraclavicular lymphadenopathy. Heart: Regular in rate and rhythm with no murmurs, rubs,  or gallops. Chest: Clear to auscultation bilaterally, with no rhonchi, wheezes, or rales. Abdomen: Soft, nontender, nondistended, with no rigidity or guarding. Extremities: No cyanosis or edema. Lymphatics: see Neck Exam Skin: No concerning lesions. Musculoskeletal: symmetric strength and muscle tone throughout. Neurologic: Cranial nerves II through XII are grossly intact. No obvious focalities. Speech is fluent. Coordination is intact.  No appreciable weakness noted in the left upper extremity. Psychiatric: Judgment and insight are intact. Affect is appropriate.   ECOG = 1  0 - Asymptomatic (Fully active, able to carry on all predisease activities without restriction)  1 - Symptomatic but completely ambulatory (Restricted in physically strenuous activity but ambulatory and able to carry out work of a light or sedentary nature. For example, light housework, office work)  2 - Symptomatic, <50% in bed during the day (Ambulatory and capable of all self care but unable to carry out any work activities. Up and about more than 50% of  waking hours)  3 - Symptomatic, >50% in bed, but not bedbound (Capable of only limited self-care, confined to bed or chair 50% or more of waking hours)  4 - Bedbound (Completely disabled. Cannot carry on any self-care. Totally confined to bed or chair)  5 - Death   Eustace Pen MM, Creech RH, Tormey DC, et al. [phone]). "Toxicity and response criteria of the [facility]". [place] Oncol. 5 (6): 649-55  LABORATORY DATA:  Lab Results  Component Value Date   WBC 3.9 (L) 10/14/2020   HGB 10.4 (L) 10/14/2020   HCT 31.7 (L) 10/14/2020   MCV 99.4 10/14/2020   PLT 287 10/14/2020   NEUTROABS 2.6 10/14/2020   Lab Results  Component Value Date   NA 136 10/14/2020   K 4.0 10/14/2020   CL 99 10/14/2020   CO2 32 10/14/2020   GLUCOSE 94 10/14/2020   CREATININE 1.78 (H) 10/14/2020   CALCIUM 8.5 (L) 10/14/2020      RADIOGRAPHY: NM PET Image Initial (PI) Skull Base To Thigh  Result Date: 10/04/2020 CLINICAL DATA:  Initial treatment strategy for small cell lung cancer. EXAM: NUCLEAR MEDICINE PET SKULL BASE TO THIGH TECHNIQUE: 7.8 mCi F-18 FDG was injected intravenously. Full-ring PET imaging was performed from the skull base to thigh after the radiotracer. CT data was obtained and used for attenuation correction and anatomic localization. Fasting blood glucose: 94 mg/dl COMPARISON:  Prior CT scans. FINDINGS: Mediastinal blood pool activity: SUV max 2.24 Liver activity: SUV max NA NECK: No hypermetabolic lymph nodes in the neck. Incidental CT findings: none CHEST: Large left apical lung mass is hypermetabolic with SUV max of 58.09. Findings are highly suspicious for chest wall invasion. There appears to be clear involvement of the apical anterior chest wall musculature (scalene muscle). Highly likely that there is also involvement of the left first and second ribs. MRI chest without and with contrast may be helpful for more definitive evaluation if clinically necessary. No enlarged or  hypermetabolic mediastinal hilar lymph nodes. No supraclavicular or axillary adenopathy. No hypermetabolic pulmonary nodules. The 10.5 mm left lower lobe nodule on image number 151/3 does not show any FDG uptake. Incidental CT findings: Stable advanced emphysematous changes and pulmonary scarring. Stable densely calcified right upper lobe granuloma. ABDOMEN/PELVIS: No abnormal hypermetabolic activity within the liver, pancreas, adrenal glands, or spleen. No hypermetabolic lymph nodes in the abdomen or pelvis. Incidental CT findings: Age advanced atherosclerotic calcifications involving the aorta and iliac arteries. Moderate stool burden. SKELETON: No focal hypermetabolic activity to suggest skeletal  metastasis. Mild uptake around the left shoulder posterior is likely inflammatory change. Incidental CT findings: none IMPRESSION: 1. 8 cm left apical lung mass is hypermetabolic and consistent with known small cell lung cancer. 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 2. No findings suspicious for mediastinal/hilar adenopathy or metastatic disease involving the abdomen/pelvis or bony structures. 3. No FDG uptake in the left lower lobe pulmonary nodule. 4. Stable age advanced emphysematous changes, pulmonary scarring and vascular disease. Electronically Signed   By: Marijo Sanes M.D.   On: 10/04/2020 14:33   IR IMAGING GUIDED PORT INSERTION  Result Date: 09/30/2020 INDICATION: 65 year old male with lung cancer, recommend for chemotherapy EXAM: IMPLANTED PORT A CATH PLACEMENT WITH ULTRASOUND AND FLUOROSCOPIC GUIDANCE MEDICATIONS: None. ANESTHESIA/SEDATION: Moderate (conscious) sedation was employed during this procedure. A total of Versed 1.5 mg and Fentanyl 50 mcg was administered intravenously. Moderate Sedation Time: 27 minutes. The patient's level of consciousness and vital signs were monitored continuously by radiology nursing throughout the procedure under  my direct supervision. FLUOROSCOPY TIME:  0 minutes, 6 seconds (1 mGy) COMPLICATIONS: None immediate. PROCEDURE: The procedure, risks, benefits, and alternatives were explained to the patient. Questions regarding the procedure were encouraged and answered. The patient understands and consents to the procedure. The right neck and chest were prepped with chlorhexidine in a sterile fashion, and a sterile drape was applied covering the operative field. Maximum barrier sterile technique with sterile gowns and gloves were used for the procedure. A timeout was performed prior to the initiation of the procedure. Local anesthesia was provided with 1% lidocaine with epinephrine. After creating a small venotomy incision, a micropuncture kit was utilized to access the internal jugular vein under direct, real-time ultrasound guidance. Ultrasound image documentation was performed. The microwire was kinked to measure appropriate catheter length. A subcutaneous port pocket was then created along the upper chest wall utilizing a combination of sharp and blunt dissection. The pocket was irrigated with sterile saline. A single lumen ISP power injectable port was chosen for placement. The 8 Fr catheter was tunneled from the port pocket site to the venotomy incision. The port was placed in the pocket. The external catheter was trimmed to appropriate length. At the venotomy, an 8 Fr peel-away sheath was placed over a guidewire under fluoroscopic guidance. The catheter was then placed through the sheath and the sheath was removed. Final catheter positioning was confirmed and documented with a fluoroscopic spot radiograph. The port was accessed with a Huber needle, aspirated and flushed with heparinized saline. The port pocket incision was closed with interrupted 3-0 Vicryl suture then Dermabond was applied, including at the venotomy incision. Dressings were placed. The patient tolerated the procedure well without immediate post procedural  complication. IMPRESSION: Successful placement of a right internal jugular approach power-injectable Port-A-Cath. The catheter is ready for immediate use. Michaelle Birks, MD [facility] Electronically Signed   By: Michaelle Birks MD   On: 09/30/2020 16:45      IMPRESSION: Stage IIB (cT4, cN0, cM0) Small cell lung cancer, left apical location (limited stage small cell lung cancer)  The patient will be a good candidate for definitive course of radiation therapy along with radiosensitizing chemotherapy.  He is quite symptomatic from his tumor in the left apex with significant pain and some neurological changes in the left arm.  Today, I talked to the patient and  about the findings and work-up thus far.  We discussed  the natural history of limited stage small cell lung cancer and general treatment, highlighting the role of radiotherapy in the management.  We discussed the available radiation techniques, and focused on the details of logistics and delivery.  We reviewed the anticipated acute and late sequelae associated with radiation in this setting.  The patient was encouraged to ask questions that I answered to the best of my ability.  A patient consent form was discussed and signed.  We retained a copy for our records.  The patient would like to proceed with radiation and will be scheduled for CT simulation.  At a later date the patient may be a candidate for prophylactic cranial radiation assuming he has a good response to his chemotherapy and radiation treatments.  PLAN: The patient will return on August 19 for CT simulation with treatment to begin next week.  Anticipate 6 weeks of radiation therapy directed at the left apical lung mass.  He has already started his chemotherapy and is tolerating this well thus far.   60 minutes of total time was spent for this patient encounter, including preparation, face-to-face counseling with the patient and  coordination of care, physical exam, and documentation of the encounter.   ------------------------------------------------  Blair Promise, PhD, MD  This document serves as a record of services personally performed by Gery Pray, MD. It was created on his behalf by Roney Mans, a trained medical scribe. The creation of this record is based on the scribe's personal observations and the provider's statements to them. This document has been checked and approved by the attending provider.

## 2020-10-14 NOTE — Patient Instructions (Signed)
[facility]  Discharge Instructions: Thank you for choosing [facility] to provide your oncology and hematology care.  If you have a lab appointment with the [facility], please come in thru the Main Entrance and check in at the main information desk.  Wear comfortable clothing and clothing appropriate for easy access to any Portacath or PICC line.   We strive to give you quality time with your provider. You may need to reschedule your appointment if you arrive late (15 or more minutes).  Arriving late affects you and other patients whose appointments are after yours.  Also, if you miss three or more appointments without notifying the office, you may be dismissed from the clinic at the provider's discretion.      For prescription refill requests, have your pharmacy contact our office and allow 72 hours for refills to be completed.    Today you received the following:  IV hydration for elevated kidney function labs      To help prevent nausea and vomiting after your treatment, we encourage you to take your nausea medication as directed.  BELOW ARE SYMPTOMS THAT SHOULD BE REPORTED IMMEDIATELY: *FEVER GREATER THAN 100.4 F (38 C) OR HIGHER *CHILLS OR SWEATING *NAUSEA AND VOMITING THAT IS NOT CONTROLLED WITH YOUR NAUSEA MEDICATION *UNUSUAL SHORTNESS OF BREATH *UNUSUAL BRUISING OR BLEEDING *URINARY PROBLEMS (pain or burning when urinating, or frequent urination) *BOWEL PROBLEMS (unusual diarrhea, constipation, pain near the anus) TENDERNESS IN MOUTH AND THROAT WITH OR WITHOUT PRESENCE OF ULCERS (sore throat, sores in mouth, or a toothache) UNUSUAL RASH, SWELLING OR PAIN  UNUSUAL VAGINAL DISCHARGE OR ITCHING   Items with * indicate a potential emergency and should be followed up as soon as possible or go to the Emergency Department if any problems should occur.  Please show the CHEMOTHERAPY ALERT CARD or IMMUNOTHERAPY ALERT CARD at check-in to the Emergency  Department and triage nurse.  Should you have questions after your visit or need to cancel or reschedule your appointment, please contact [facility] [phone]  and follow the prompts.  Office hours are 8:00 a.m. to 4:30 p.m. Monday - Friday. Please note that voicemails left after 4:00 p.m. may not be returned until the following business day.  We are closed weekends and major holidays. You have access to a nurse at all times for urgent questions. Please call the main number to the clinic [phone] and follow the prompts.  For any non-urgent questions, you may also contact your provider using MyChart. We now offer e-Visits for anyone 39 and older to request care online for non-urgent symptoms. For details visit [email or web address].   Also download the MyChart app! Go to the app store, search "MyChart", open the app, select [facility], and log in with your MyChart username and password.  Due to Covid, a mask is required upon entering the hospital/clinic. If you do not have a mask, one will be given to you upon arrival. For doctor visits, patients may have 1 support person aged 59 or older with them. For treatment visits, patients cannot have anyone with them due to current Covid guidelines and our immunocompromised population.

## 2020-10-14 NOTE — Patient Instructions (Signed)
[facility] at [facility] Discharge Instructions  You were seen today by Tarri Abernethy PA-C for your symptom management and toxicity check following chemotherapy.  PAIN: We will continue to adjust medications to manage your cancer pain. INCREASE methadone to 10 mg 3 times daily (every 8 hours) Continue gabapentin 300 mg 3 times daily STOP taking meloxicam  KIDNEYS: You have some chemotherapy induced kidney damage.  We will give you IV fluids today, and we will also have you return tomorrow for additional IV fluids.  Make sure that you are drinking AT LEAST 3 L OF WATER at home each day.  It is also very important that you DO NOT take any more meloxicam, ibuprofen, naproxen, Aleve, or any other NSAID pain medication.  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  Take Senokot only as needed if you have constipation despite taking Movantik.  LABS: Return on Monday, 10/20/2020 for repeat labs and POSSIBLE FLUIDS  FOLLOW-UP APPOINTMENT: Next visit with Dr. Delton Coombes is 10/27/2020   Thank you for choosing [facility] at [facility] to provide your oncology and hematology care.  To afford each patient quality time with our provider, please arrive at least 15 minutes before your scheduled appointment time.   If you have a lab appointment with the [facility] please come in thru the Main Entrance and check in at the main information desk.  You need to re-schedule your appointment should you arrive 10 or more minutes late.  We strive to give you quality time with our providers, and arriving late affects you and other patients whose appointments are after yours.  Also, if you no show three or more times for appointments you may be dismissed from the clinic at the providers discretion.     Again, thank you for choosing [facility].  Our hope is that these requests will decrease the amount of time that  you wait before being seen by our physicians.       _____________________________________________________________  Should you have questions after your visit to [facility], please contact our office at [phone] and follow the prompts.  Our office hours are 8:00 a.m. and 4:30 p.m. Monday - Friday.  Please note that voicemails left after 4:00 p.m. may not be returned until the following business day.  We are closed weekends and major holidays.  You do have access to a nurse 24-7, just call the main number to the clinic [phone] and do not press any options, hold on the line and a nurse will answer the phone.    For prescription refill requests, have your pharmacy contact our office and allow 72 hours.    Due to Covid, you will need to wear a mask upon entering the hospital. If you do not have a mask, a mask will be given to you at the Main Entrance upon arrival. For doctor visits, patients may have 1 support person age 34 or older with them. For treatment visits, patients can not have anyone with them due to social distancing guidelines and our immunocompromised population.

## 2020-10-15 ENCOUNTER — Ambulatory Visit: Payer: Medicare Other

## 2020-10-15 ENCOUNTER — Ambulatory Visit
Admission: RE | Admit: 2020-10-15 | Discharge: 2020-10-15 | Disposition: A | Discharge status: Home / Self Care | Payer: Medicare Other | Source: Ambulatory Visit | Attending: Radiation Oncology | Admitting: Radiation Oncology

## 2020-10-15 ENCOUNTER — Encounter: Payer: Self-pay | Admitting: Radiation Oncology

## 2020-10-15 ENCOUNTER — Encounter (HOSPITAL_COMMUNITY): Payer: Self-pay

## 2020-10-15 ENCOUNTER — Inpatient Hospital Stay (HOSPITAL_COMMUNITY): Payer: Medicare Other

## 2020-10-15 ENCOUNTER — Ambulatory Visit: Payer: Medicare Other | Admitting: Radiation Oncology

## 2020-10-15 VITALS — BP 109/67 | HR 58 | Temp 97.8°F | Resp 18

## 2020-10-15 VITALS — BP 108/76 | HR 60 | Temp 98.1°F | Resp 18 | Ht 70.0 in | Wt 149.3 lb

## 2020-10-15 DIAGNOSIS — C3412 Malignant neoplasm of upper lobe, left bronchus or lung: Secondary | ICD-10-CM | POA: Diagnosis not present

## 2020-10-15 DIAGNOSIS — E079 Disorder of thyroid, unspecified: Secondary | ICD-10-CM | POA: Insufficient documentation

## 2020-10-15 DIAGNOSIS — C3492 Malignant neoplasm of unspecified part of left bronchus or lung: Secondary | ICD-10-CM

## 2020-10-15 DIAGNOSIS — M545 Low back pain, unspecified: Secondary | ICD-10-CM | POA: Diagnosis not present

## 2020-10-15 DIAGNOSIS — Z5111 Encounter for antineoplastic chemotherapy: Secondary | ICD-10-CM | POA: Diagnosis not present

## 2020-10-15 DIAGNOSIS — Z79899 Other long term (current) drug therapy: Secondary | ICD-10-CM | POA: Diagnosis not present

## 2020-10-15 DIAGNOSIS — Z800 Family history of malignant neoplasm of digestive organs: Secondary | ICD-10-CM | POA: Insufficient documentation

## 2020-10-15 DIAGNOSIS — N179 Acute kidney failure, unspecified: Secondary | ICD-10-CM

## 2020-10-15 DIAGNOSIS — G43909 Migraine, unspecified, not intractable, without status migrainosus: Secondary | ICD-10-CM | POA: Insufficient documentation

## 2020-10-15 DIAGNOSIS — Z87891 Personal history of nicotine dependence: Secondary | ICD-10-CM | POA: Insufficient documentation

## 2020-10-15 DIAGNOSIS — J45909 Unspecified asthma, uncomplicated: Secondary | ICD-10-CM | POA: Insufficient documentation

## 2020-10-15 MED ORDER — SODIUM CHLORIDE 0.9 % IV SOLN: INTRAVENOUS | Status: DC

## 2020-10-15 MED ORDER — HEPARIN SOD (PORK) LOCK FLUSH 100 UNIT/ML IV SOLN
500.0000 [IU] | Freq: Once | INTRAVENOUS | Status: AC
Start: 2020-10-15 — End: 2020-10-15
  Administered 2020-10-15: 500 [IU] via INTRAVENOUS

## 2020-10-15 MED ORDER — SODIUM CHLORIDE 0.9% FLUSH
10.0000 mL | Freq: Once | INTRAVENOUS | Status: AC
  Administered 2020-10-15: 10 mL via INTRAVENOUS

## 2020-10-15 NOTE — Patient Instructions (Signed)
[facility]  Discharge Instructions: Thank you for choosing [facility] to provide your oncology and hematology care.  If you have a lab appointment with the [facility], please come in thru the Main Entrance and check in at the main information desk.  Wear comfortable clothing and clothing appropriate for easy access to any Portacath or PICC line.   We strive to give you quality time with your provider. You may need to reschedule your appointment if you arrive late (15 or more minutes).  Arriving late affects you and other patients whose appointments are after yours.  Also, if you miss three or more appointments without notifying the office, you may be dismissed from the clinic at the provider's discretion.      For prescription refill requests, have your pharmacy contact our office and allow 72 hours for refills to be completed.    Today you received the following 107mL of Normal Saline. Return as scheduled.   To help prevent nausea and vomiting after your treatment, we encourage you to take your nausea medication as directed.  BELOW ARE SYMPTOMS THAT SHOULD BE REPORTED IMMEDIATELY: *FEVER GREATER THAN 100.4 F (38 C) OR HIGHER *CHILLS OR SWEATING *NAUSEA AND VOMITING THAT IS NOT CONTROLLED WITH YOUR NAUSEA MEDICATION *UNUSUAL SHORTNESS OF BREATH *UNUSUAL BRUISING OR BLEEDING *URINARY PROBLEMS (pain or burning when urinating, or frequent urination) *BOWEL PROBLEMS (unusual diarrhea, constipation, pain near the anus) TENDERNESS IN MOUTH AND THROAT WITH OR WITHOUT PRESENCE OF ULCERS (sore throat, sores in mouth, or a toothache) UNUSUAL RASH, SWELLING OR PAIN  UNUSUAL VAGINAL DISCHARGE OR ITCHING   Items with * indicate a potential emergency and should be followed up as soon as possible or go to the Emergency Department if any problems should occur.  Please show the CHEMOTHERAPY ALERT CARD or IMMUNOTHERAPY ALERT CARD at check-in to the Emergency Department and  triage nurse.  Should you have questions after your visit or need to cancel or reschedule your appointment, please contact [facility] [phone]  and follow the prompts.  Office hours are 8:00 a.m. to 4:30 p.m. Monday - Friday. Please note that voicemails left after 4:00 p.m. may not be returned until the following business day.  We are closed weekends and major holidays. You have access to a nurse at all times for urgent questions. Please call the main number to the clinic [phone] and follow the prompts.  For any non-urgent questions, you may also contact your provider using MyChart. We now offer e-Visits for anyone 2 and older to request care online for non-urgent symptoms. For details visit [email or web address].   Also download the MyChart app! Go to the app store, search "MyChart", open the app, select [facility], and log in with your MyChart username and password.  Due to Covid, a mask is required upon entering the hospital/clinic. If you do not have a mask, one will be given to you upon arrival. For doctor visits, patients may have 1 support person aged 64 or older with them. For treatment visits, patients cannot have anyone with them due to current Covid guidelines and our immunocompromised population.

## 2020-10-15 NOTE — Progress Notes (Signed)
See MD note for nursing evaluation. °

## 2020-10-15 NOTE — Progress Notes (Signed)
Patient presents today for Normal saline infusion. Patient tolerated infusion today with no complaints voiced. Port flushed with good blood return noted. No bruising or swelling at site. Bandaid applied and patient discharged in satisfactory condition. VVS stable with no signs or symptoms of distressed noted.

## 2020-10-16 ENCOUNTER — Encounter: Payer: Self-pay | Admitting: General Practice

## 2020-10-16 NOTE — Progress Notes (Signed)
[facility] Clinical Social Work  Initial Assessment   Stefanos Haynesworth is a 65 y.o. year old male contacted by phone. Clinical Social Work was referred by radiation oncology for assessment of psychosocial needs.   SDOH (Social Determinants of Health) assessments performed: Yes SDOH Interventions    Flowsheet Row Most Recent Value  SDOH Interventions   Transportation Interventions [facility]       Distress Screen completed: Yes ONCBCN DISTRESS SCREENING 10/15/2020  Screening Type Initial Screening  Distress experienced in past week (1-10) 9  Practical problem type Transportation  Emotional problem type Adjusting to illness;Adjusting to appearance changes  Physical Problem type Pain;Constipation/diarrhea;Changes in urination  Referral to clinical social work Yes      Family/Social Information:  Housing Arrangement: patient lives alone no concerns about rent, he has made arrangements to have daughter stay w him as he gets further into treatment, daughter usually lives in [place] Family members/support persons in your life? Family and Friends/Colleagues Transportation concerns: yes, hard to afford gas Employment: Retired and Disabled. 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  He underwent a lung biopsy at that time which resulted in a cancer diagnosis.  He will be treated w chemotherapy and immunotherapy - has already started this.  He will come to [facility] for radiation therapy for pain control, beginning  next week.  He has a positive outlook, is determined to do what he needs to do in order to receive full treatment for his cancer.  He is determined, positive and has support from family and community.  His main concern is his limited income, needs help paying for gas to get to/from treatment.   Concerns about diagnosis and/or treatment: I'm not especially worried about anything and getting through treatment Patient reported stressors:  does not know what to expect, "want to get rid of this pain." Hopes and priorities: "get through this process", not depressed about it, "we gotta do what we gotta do" Patient enjoys time with family/ friends and house projects, "always doing something", yard work Current coping skills/ strengths: Ability for insight, Average or above average intelligence, Capable of independent living, and Supportive family/friends    SUMMARY: Current SDOH Barriers:  Transportation  Interventions: Discussed common feeling and emotions when being diagnosed with cancer, and the importance of support during treatment Informed patient of the support team roles and support services at [facility] Provided CSW contact information and encouraged patient to call with any questions or concerns Referred patient to [place] gas card program   Follow Up Plan: CSW will see patient on 10/16/20 to enroll in [facility] Patient verbalizes understanding of plan: Yes    Beverely Pace , [place], [place] Worker Phone:  [phone]

## 2020-10-17 ENCOUNTER — Inpatient Hospital Stay: Payer: Medicare Other | Attending: Radiation Oncology | Admitting: General Practice

## 2020-10-17 ENCOUNTER — Ambulatory Visit
Admission: RE | Admit: 2020-10-17 | Discharge: 2020-10-17 | Disposition: A | Discharge status: Home / Self Care | Payer: Medicare Other | Source: Ambulatory Visit | Attending: Radiation Oncology | Admitting: Radiation Oncology

## 2020-10-17 ENCOUNTER — Other Ambulatory Visit: Payer: Self-pay

## 2020-10-17 DIAGNOSIS — Z87891 Personal history of nicotine dependence: Secondary | ICD-10-CM | POA: Insufficient documentation

## 2020-10-17 DIAGNOSIS — C3492 Malignant neoplasm of unspecified part of left bronchus or lung: Secondary | ICD-10-CM

## 2020-10-17 DIAGNOSIS — Z510 Encounter for antineoplastic radiation therapy: Secondary | ICD-10-CM | POA: Diagnosis present

## 2020-10-17 DIAGNOSIS — C3412 Malignant neoplasm of upper lobe, left bronchus or lung: Secondary | ICD-10-CM | POA: Diagnosis present

## 2020-10-17 NOTE — Progress Notes (Signed)
[facility] CSW Progress Notes  Met w patient in lobby, provided first disbursement from [employer] for [employer].  He will call us when he needs another disbursement, can have up to 3 more while in treatment.  Edwyna Shell, LCSW Clinical Social Worker Phone:  [phone]

## 2020-10-20 ENCOUNTER — Inpatient Hospital Stay (HOSPITAL_COMMUNITY): Payer: Medicare Other

## 2020-10-21 ENCOUNTER — Other Ambulatory Visit: Payer: Self-pay | Admitting: Hematology and Oncology

## 2020-10-22 DIAGNOSIS — Z510 Encounter for antineoplastic radiation therapy: Secondary | ICD-10-CM | POA: Diagnosis not present

## 2020-10-23 ENCOUNTER — Other Ambulatory Visit: Payer: Self-pay

## 2020-10-23 ENCOUNTER — Ambulatory Visit
Admission: RE | Admit: 2020-10-23 | Discharge: 2020-10-23 | Disposition: A | Discharge status: Home / Self Care | Payer: Medicare Other | Source: Ambulatory Visit | Attending: Radiation Oncology | Admitting: Radiation Oncology

## 2020-10-23 DIAGNOSIS — C3492 Malignant neoplasm of unspecified part of left bronchus or lung: Secondary | ICD-10-CM

## 2020-10-23 DIAGNOSIS — Z510 Encounter for antineoplastic radiation therapy: Secondary | ICD-10-CM | POA: Diagnosis not present

## 2020-10-24 ENCOUNTER — Telehealth: Payer: Self-pay | Admitting: General Practice

## 2020-10-24 ENCOUNTER — Ambulatory Visit
Admission: RE | Admit: 2020-10-24 | Discharge: 2020-10-24 | Disposition: A | Discharge status: Home / Self Care | Payer: Medicare Other | Source: Ambulatory Visit | Attending: Radiation Oncology | Admitting: Radiation Oncology

## 2020-10-24 DIAGNOSIS — Z510 Encounter for antineoplastic radiation therapy: Secondary | ICD-10-CM | POA: Diagnosis not present

## 2020-10-24 NOTE — Telephone Encounter (Signed)
[facility] CSW Progress Notes  Call to patient to check in, he appreciates help with gas cards.  These have enabled him to get to his radiation appointments.  Scheduled second disbursement for Friday Sept 2, he will come to [place] to pick up cards.  Edwyna Shell, LCSW Clinical Social Worker Phone:  [phone]

## 2020-10-27 ENCOUNTER — Other Ambulatory Visit: Payer: Self-pay

## 2020-10-27 ENCOUNTER — Encounter (HOSPITAL_COMMUNITY): Payer: Self-pay | Admitting: Hematology

## 2020-10-27 ENCOUNTER — Inpatient Hospital Stay (HOSPITAL_COMMUNITY): Payer: Medicare Other | Admitting: Dietician

## 2020-10-27 ENCOUNTER — Ambulatory Visit: Payer: Medicare Other

## 2020-10-27 ENCOUNTER — Inpatient Hospital Stay (HOSPITAL_COMMUNITY): Payer: Medicare Other

## 2020-10-27 ENCOUNTER — Ambulatory Visit (HOSPITAL_COMMUNITY): Payer: Medicare Other | Admitting: Hematology

## 2020-10-27 ENCOUNTER — Ambulatory Visit
Admission: RE | Admit: 2020-10-27 | Discharge: 2020-10-27 | Disposition: A | Discharge status: Home / Self Care | Payer: Medicare Other | Source: Ambulatory Visit | Attending: Radiation Oncology | Admitting: Radiation Oncology

## 2020-10-27 ENCOUNTER — Inpatient Hospital Stay (HOSPITAL_COMMUNITY): Payer: Medicare Other | Admitting: Hematology

## 2020-10-27 VITALS — BP 127/77 | HR 78 | Temp 96.7°F | Resp 18 | Wt 138.4 lb

## 2020-10-27 DIAGNOSIS — C3490 Malignant neoplasm of unspecified part of unspecified bronchus or lung: Secondary | ICD-10-CM

## 2020-10-27 DIAGNOSIS — Z510 Encounter for antineoplastic radiation therapy: Secondary | ICD-10-CM | POA: Diagnosis not present

## 2020-10-27 DIAGNOSIS — C3492 Malignant neoplasm of unspecified part of left bronchus or lung: Secondary | ICD-10-CM

## 2020-10-27 DIAGNOSIS — C3412 Malignant neoplasm of upper lobe, left bronchus or lung: Secondary | ICD-10-CM

## 2020-10-27 DIAGNOSIS — D702 Other drug-induced agranulocytosis: Secondary | ICD-10-CM

## 2020-10-27 DIAGNOSIS — Z5111 Encounter for antineoplastic chemotherapy: Secondary | ICD-10-CM | POA: Diagnosis not present

## 2020-10-27 LAB — CBC WITH DIFFERENTIAL/PLATELET
Abs Immature Granulocytes: 0.01 10*3/uL (ref 0.00–0.07)
Basophils Absolute: 0 10*3/uL (ref 0.0–0.1)
Basophils Relative: 1 %
Eosinophils Absolute: 0.1 10*3/uL (ref 0.0–0.5)
Eosinophils Relative: 3 %
HCT: 32 % — ABNORMAL LOW (ref 39.0–52.0)
Hemoglobin: 10.6 g/dL — ABNORMAL LOW (ref 13.0–17.0)
Immature Granulocytes: 0 %
Lymphocytes Relative: 64 %
Lymphs Abs: 1.5 10*3/uL (ref 0.7–4.0)
MCH: 32.8 pg (ref 26.0–34.0)
MCHC: 33.1 g/dL (ref 30.0–36.0)
MCV: 99.1 fL (ref 80.0–100.0)
Monocytes Absolute: 0.5 10*3/uL (ref 0.1–1.0)
Monocytes Relative: 22 %
Neutro Abs: 0.2 10*3/uL — CL (ref 1.7–7.7)
Neutrophils Relative %: 10 %
Platelets: 285 10*3/uL (ref 150–400)
RBC: 3.23 MIL/uL — ABNORMAL LOW (ref 4.22–5.81)
RDW: 15.3 % (ref 11.5–15.5)
WBC: 2.3 10*3/uL — ABNORMAL LOW (ref 4.0–10.5)
nRBC: 0 % (ref 0.0–0.2)

## 2020-10-27 LAB — COMPREHENSIVE METABOLIC PANEL
ALT: 9 U/L (ref 0–44)
AST: 17 U/L (ref 15–41)
Albumin: 3.5 g/dL (ref 3.5–5.0)
Alkaline Phosphatase: 41 U/L (ref 38–126)
Anion gap: 8 (ref 5–15)
BUN: 22 mg/dL (ref 8–23)
CO2: 29 mmol/L (ref 22–32)
Calcium: 8.3 mg/dL — ABNORMAL LOW (ref 8.9–10.3)
Chloride: 100 mmol/L (ref 98–111)
Creatinine, Ser: 1.77 mg/dL — ABNORMAL HIGH (ref 0.61–1.24)
GFR, Estimated: 42 mL/min — ABNORMAL LOW (ref >60)
Glucose, Bld: 79 mg/dL (ref 70–99)
Potassium: 4.1 mmol/L (ref 3.5–5.1)
Sodium: 137 mmol/L (ref 135–145)
Total Bilirubin: 0.8 mg/dL (ref 0.3–1.2)
Total Protein: 7.3 g/dL (ref 6.5–8.1)

## 2020-10-27 LAB — MAGNESIUM: Magnesium: 1.9 mg/dL (ref 1.7–2.4)

## 2020-10-27 MED ORDER — SODIUM CHLORIDE 0.9% FLUSH
10.0000 mL | Freq: Once | INTRAVENOUS | Status: AC
  Administered 2020-10-27: 10 mL via INTRAVENOUS

## 2020-10-27 MED ORDER — HEPARIN SOD (PORK) LOCK FLUSH 100 UNIT/ML IV SOLN
500.0000 [IU] | Freq: Once | INTRAVENOUS | Status: AC
  Administered 2020-10-27: 500 [IU] via INTRAVENOUS

## 2020-10-27 MED ORDER — FILGRASTIM-SNDZ 480 MCG/0.8ML IJ SOSY
480.0000 ug | PREFILLED_SYRINGE | Freq: Once | INTRAMUSCULAR | Status: AC
  Administered 2020-10-27: 480 ug via SUBCUTANEOUS
  Filled 2020-10-27: qty 0.8

## 2020-10-27 NOTE — Progress Notes (Signed)
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 He is receiving concurrent chemoradiation therapy with weekly cisplatin.   Past medical history includes hypothyroidism, chronic back pain, malignant cachexia, hypercholesteremia.   Met with patient in infusion. He reports his appetite is "great" and is eating 3 meals daily with snacks (pretzels, cookies) in between. Patient is drinking 1 Ensure (unsure of which kind) most everyday. Patient eat oatmeal or cereal (Cheerios, Frosted Flakes) for breakfast during the week. On the weekends, he eats a bigger breakfast (sausage, bacon, eggs). For lunch he usually has a cold cut sandwich, chips or salad. Patient reports dinner is a meat (baked chicken, pork chops) and vegetables. He is drinking whole milk, chocolate milk, juices (grape, apple, orange) and water. Patient reports he fills up a 64 oz jug of water at night and tries to drink this the next day, sometimes he is only able to drink half. He denies nutrition impact symptoms.    Nutrition Focused Physical Exam:  Moderate fat depletion to orbital, buccal, thoracic lumbar regions Moderate muscle depletion to temporal, clavicle and acromion regions    Medications: Movantik, Senokot  Labs: Cr 1.77   Anthropometrics: Weight has decreased 9 lbs from 147 lb on 7/26. This is significant for time frame.  Height: 5'10" Weight: 138 lb 7.2 oz UBW: 160 lb  BMI: 19.87   NUTRITION DIAGNOSIS: Unintentional weight loss related to cancer and associated treatments as evidenced by 6.1% (9 lb) weight loss 1 month. This is significant for time frame   MALNUTRITION DIAGNOSIS: Moderate malnutrition in the context of chronic illness as evidenced moderate fat and muscle depletions and significant weight loss in 1 month   INTERVENTION:  Educated on the importance of adequate calorie and protein energy intake to maintain strength, weights,  nutrition Discussed foods with protein, encouraged 3 meals and 3 snacks with adequate calories and protein - handout with recipes given Continue drinking Ensure supplement, recommended 2 Ensure Plus/equivalent daily (350 kcal, 16 grams protein) - coupons given Contact information provided   MONITORING, EVALUATION, GOAL: Patient will tolerate increased calories and protein to promote weight stability/gain   Next Visit: Tuesday September 20 after radiation therapy

## 2020-10-27 NOTE — Progress Notes (Addendum)
ANC 200  holding chemo add zarxio 480 mcg subq x 1 today.  T.O Dr Rhys Martini, PharmD

## 2020-10-27 NOTE — Progress Notes (Signed)
Patient presents today for Cisplatin/VP infusion per providers order.  Vital signs within parameters for treatment.  Labs pending.  Patient has no new complaints at this time.  ANC noted to be 0.2.  Treatment to be held today per Dr. Delton Coombes, [facility] pending insurance approval,  patient will return tomorrow for PORT fulsh labs and possible treatment.    Stable during Zarxio administration without incident; injection site WNL; see MAR for injection details.  Patient tolerated procedure well and without incident.  No questions or complaints noted at this time. Discharge from clinic ambulatory in stable condition.  Alert and oriented X 3.  Follow up with [facility] as scheduled.

## 2020-10-27 NOTE — Progress Notes (Signed)
Patients port flushed without difficulty.  Good blood return noted with no bruising or swelling noted at site.  Stable during access and blood draw.  Patient to remain accessed for treatment. 

## 2020-10-27 NOTE — Patient Instructions (Signed)
[facility]  Discharge Instructions: Thank you for choosing [facility] to provide your oncology and hematology care.  If you have a lab appointment with the [facility], please come in thru the Main Entrance and check in at the main information desk.  Wear comfortable clothing and clothing appropriate for easy access to any Portacath or PICC line.   We strive to give you quality time with your provider. You may need to reschedule your appointment if you arrive late (15 or more minutes).  Arriving late affects you and other patients whose appointments are after yours.  Also, if you miss three or more appointments without notifying the office, you may be dismissed from the clinic at the provider's discretion.      For prescription refill requests, have your pharmacy contact our office and allow 72 hours for refills to be completed.    Today you received the following chemotherapy and/or immunotherapy agents Zarxio injection      To help prevent nausea and vomiting after your treatment, we encourage you to take your nausea medication as directed.  BELOW ARE SYMPTOMS THAT SHOULD BE REPORTED IMMEDIATELY: *FEVER GREATER THAN 100.4 F (38 C) OR HIGHER *CHILLS OR SWEATING *NAUSEA AND VOMITING THAT IS NOT CONTROLLED WITH YOUR NAUSEA MEDICATION *UNUSUAL SHORTNESS OF BREATH *UNUSUAL BRUISING OR BLEEDING *URINARY PROBLEMS (pain or burning when urinating, or frequent urination) *BOWEL PROBLEMS (unusual diarrhea, constipation, pain near the anus) TENDERNESS IN MOUTH AND THROAT WITH OR WITHOUT PRESENCE OF ULCERS (sore throat, sores in mouth, or a toothache) UNUSUAL RASH, SWELLING OR PAIN  UNUSUAL VAGINAL DISCHARGE OR ITCHING   Items with * indicate a potential emergency and should be followed up as soon as possible or go to the Emergency Department if any problems should occur.  Please show the CHEMOTHERAPY ALERT CARD or IMMUNOTHERAPY ALERT CARD at check-in to the Emergency  Department and triage nurse.  Should you have questions after your visit or need to cancel or reschedule your appointment, please contact [facility] [phone]  and follow the prompts.  Office hours are 8:00 a.m. to 4:30 p.m. Monday - Friday. Please note that voicemails left after 4:00 p.m. may not be returned until the following business day.  We are closed weekends and major holidays. You have access to a nurse at all times for urgent questions. Please call the main number to the clinic [phone] and follow the prompts.  For any non-urgent questions, you may also contact your provider using MyChart. We now offer e-Visits for anyone 55 and older to request care online for non-urgent symptoms. For details visit [email or web address].   Also download the MyChart app! Go to the app store, search "MyChart", open the app, select [facility], and log in with your MyChart username and password.  Due to Covid, a mask is required upon entering the hospital/clinic. If you do not have a mask, one will be given to you upon arrival. For doctor visits, patients may have 1 support person aged 12 or older with them. For treatment visits, patients cannot have anyone with them due to current Covid guidelines and our immunocompromised population.

## 2020-10-27 NOTE — Progress Notes (Signed)
Miguel Colon, [place] [address]   CLINIC:  Medical Oncology/Hematology  PCP:  [name], MD [address] [place] / [place] [address] [phone]   REASON FOR VISIT:  Follow-up for small cell lung cancer  PRIOR THERAPY: none  NGS Results: not done  CURRENT THERAPY: Cisplatin D1 + Etoposide D1-3 q21d  BRIEF ONCOLOGIC HISTORY:  Oncology History  Small cell lung cancer, left ([facility])  08/04/2020 Imaging   No evidence of thoracic aortic aneurysm or dissection.   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   No findings specific for metastatic disease.   Aortic Atherosclerosis (ICD10-I70.0) and Emphysema (ICD10-J43.9).   09/02/2020 Imaging   MRI brain IMPRESSION: 1. No acute intracranial abnormality. 2. Mild chronic small vessel ischemic disease.   09/04/2020 Imaging   CT abdomen and pelvis 1. Left lung base nodule, likely metastatic. 2. No metastatic disease demonstrated in the abdomen or pelvis. 3. Diffusely stool-filled colon with prominent stool filled rectum, likely constipation.   09/05/2020 Pathology Results   FINAL MICROSCOPIC DIAGNOSIS:   A. LUNG, LUL, BIOPSY:  - Poorly differentiated carcinoma.  - See comment.   COMMENT:   The findings favor poorly differentiated non-small cell carcinoma.  Immunohistochemistry will be performed and reported as an addendum.   ADDENDUM:   By immunohistochemistry, the neoplastic cells are positive for TTF-1, synaptophysin and chromogranin (dot-like positivity) but negative for cytokeratin 5/6, cytokeratin 7, CD56, p40, and p63.  Overall the  morphology and immunophenotype, are consistent with a small cell carcinoma.       09/17/2020 Initial Diagnosis   Small cell lung cancer, left ([facility])   09/17/2020 Cancer Staging   Staging form: Lung, AJCC 8th Edition - Clinical stage from 09/17/2020: Stage IIB (cT3, cN0, cM0) - Signed by Heath Lark, MD on 09/17/2020 Stage prefix: Initial diagnosis   10/07/2020 -  Chemotherapy    Patient is on Treatment Plan: LUNG SMALL CELL CISPLATIN D1 + ETOPOSIDE D1-3 Q21D         CANCER STAGING: Cancer Staging Small cell lung cancer, left ([facility]) Staging form: Lung, AJCC 8th Edition - Clinical stage from 09/17/2020: Stage IIB (cT3, cN0, cM0) - Signed by Heath Lark, MD on 09/17/2020   INTERVAL HISTORY:  [name], a 65 y.o. male, returns for routine follow-up and consideration for next cycle of chemotherapy. Savalas was last seen on 10/07/20.  Due for cycle #2 of Cisplatin + Etoposide today.   Overall, he tells me he has been feeling pretty well, and he is accompanied by his neighbor. He reports that his left shoulder pain is improved, and he is taking 1 methadone tablet daily. He started radiation therapy on 10/18/2020. He reports he is drinking plenty of water daily, and he denies any new pains. He is currently taking levothyroxine, gabapentin, and movantik. He denies n/v/d/c, loss of appetite, ankle swellings, and changes in hearing. He reports baseline tingling/numbness in his left hand that was present prior to starting chemotherapy. He lost 7 lbs over the past 3 weeks. He currently drinks 1 Ensure daily.   Overall, he feels ready for next cycle of chemo today.   REVIEW OF SYSTEMS:  Review of Systems  Constitutional:  Negative for appetite change, fatigue (80%) and unexpected weight change (- 7 lbs).  HENT:   Negative for hearing loss.   Cardiovascular:  Negative for leg swelling.  Gastrointestinal:  Negative for constipation, diarrhea, nausea and  vomiting.  Musculoskeletal:  Positive for arthralgias (4/10 L shoulder).  All other systems reviewed and are negative.  PAST MEDICAL/SURGICAL HISTORY:  Past Medical History:  Diagnosis Date   Asthma    Back pain    Lung cancer ([employer])    Migraines    Thyroid disease    Past Surgical History:  Procedure Laterality Date   IR IMAGING  GUIDED PORT INSERTION  09/30/2020   THYROIDECTOMY      SOCIAL HISTORY:  Social History   Socioeconomic History   Marital status: Legally Separated    Spouse name: Not on file   Number of children: 3   Years of education: Not on file   Highest education level: Not on file  Occupational History   Occupation: Retired  Tobacco Use   Smoking status: Former    Packs/day: 0.50    Years: 47.00    Pack years: 23.50    Types: Cigarettes    Quit date: 09/02/2020    Years since quitting: 0.1   Smokeless tobacco: Never  Vaping Use   Vaping Use: Never used  Substance and Sexual Activity   Alcohol use: No   Drug use: No   Sexual activity: Not Currently  Other Topics Concern   Not on file  Social History Narrative   Not on file   Social Determinants of Health   Financial Resource Strain: Medium Risk   Difficulty of Paying Living Expenses: Somewhat hard  Food Insecurity: No Food Insecurity   Worried About Charity fundraiser in the Last Year: Never true   [place] in the Last Year: Never true  Transportation Needs: Unmet Transportation Needs   Lack of Transportation (Medical): Yes   Lack of Transportation (Non-Medical): Yes  Physical Activity: Insufficiently Active   Days of Exercise per Week: 5 days   Minutes of Exercise per Session: 20 min  Stress: No Stress Concern Present   Feeling of Stress : Not at all  Social Connections: Socially Isolated   Frequency of Communication with Friends and Family: Three times a week   Frequency of Social Gatherings with Friends and Family: Three times a week   Attends Religious Services: Never   Active Member of Clubs or Organizations: No   Attends Archivist Meetings: Never   Marital Status: Separated  Intimate Partner Violence: Not At Risk   Fear of Current or Ex-Partner: No   Emotionally Abused: No   Physically Abused: No   Sexually Abused: No    FAMILY HISTORY:  Family History  Problem Relation Age of Onset    Hypertension Mother    Hypertension Father    Colon cancer Father 37    CURRENT MEDICATIONS:  Current Outpatient Medications  Medication Sig Dispense Refill   acetaminophen (TYLENOL) 500 MG tablet Take 2 tablets (1,000 mg total) by mouth 3 (three) times daily. 30 tablet 0   CISPLATIN IV Inject into the vein every 21 ( twenty-one) days.     Ensure (ENSURE) Take 237 mLs by mouth daily.     ETOPOSIDE IV Inject into the vein every 21 ( twenty-one) days. Days 1-3 every 21 days     gabapentin (NEURONTIN) 300 MG capsule Take 1 capsule (300 mg total) by mouth 3 (three) times daily. 90 capsule 3   levothyroxine (SYNTHROID) 125 MCG tablet Take 2 tablets (250 mcg total) by mouth daily before breakfast. 60 tablet 3   lidocaine (LIDODERM) 5 % Place 1 patch onto the skin daily. Remove &  Discard patch within 12 hours or as directed by MD 30 patch 0   lidocaine-prilocaine (EMLA) cream Apply to affected area once 30 g 3   methadone (DOLOPHINE) 10 MG tablet Take 1 tablet (10 mg total) by mouth every 8 (eight) hours. 90 tablet 0   naloxegol oxalate (MOVANTIK) 25 MG TABS tablet Take 1 tablet (25 mg total) by mouth daily. 30 tablet 3   nicotine (NICODERM CQ - DOSED IN MG/24 HOURS) 14 mg/24hr patch Place 1 patch (14 mg total) onto the skin daily. 28 patch 0   polyethylene glycol (MIRALAX) 17 g packet Take 17 g by mouth daily. 30 each 2   prochlorperazine (COMPAZINE) 10 MG tablet Take 1 tablet (10 mg total) by mouth every 6 (six) hours as needed (Nausea or vomiting). 30 tablet 1   senna (SENOKOT) 8.6 MG TABS tablet Take 2 tablets (17.2 mg total) by mouth 2 (two) times daily. 120 tablet 0   traZODone (DESYREL) 50 MG tablet Take 1 tablet (50 mg total) by mouth at bedtime. 30 tablet 3   ondansetron (ZOFRAN) 8 MG tablet Take 1 tablet (8 mg total) by mouth 2 (two) times daily as needed. Start on the third day after cisplatin chemotherapy. (Patient not taking: Reported on 10/28/2020) 30 tablet 1   No current  facility-administered medications for this visit.    ALLERGIES:  No Known Allergies  PHYSICAL EXAM:  Performance status (ECOG): 1 - Symptomatic but completely ambulatory  Vitals:   10/28/20 0835  BP: 115/64  Pulse: 73  Resp: 17  Temp: (!) 97.1 F (36.2 C)  SpO2: 94%   Wt Readings from Last 3 Encounters:  10/28/20 138 lb 6.4 oz (62.8 kg)  10/27/20 138 lb 7.2 oz (62.8 kg)  10/15/20 149 lb 4.8 oz (67.7 kg)   Physical Exam Vitals reviewed.  Constitutional:      Appearance: Normal appearance.  Cardiovascular:     Rate and Rhythm: Normal rate and regular rhythm.     Pulses: Normal pulses.     Heart sounds: Normal heart sounds.  Pulmonary:     Effort: Pulmonary effort is normal.     Breath sounds: Normal breath sounds.  Neurological:     General: No focal deficit present.     Mental Status: He is alert and oriented to person, place, and time.  Psychiatric:        Mood and Affect: Mood normal.        Behavior: Behavior normal.    LABORATORY DATA:  I have reviewed the labs as listed.  CBC Latest Ref Rng & Units 10/28/2020 10/27/2020 10/14/2020  WBC 4.0 - 10.5 K/uL 4.5 2.3(L) 3.9(L)  Hemoglobin 13.0 - 17.0 g/dL 10.8(L) 10.6(L) 10.4(L)  Hematocrit 39.0 - 52.0 % 32.8(L) 32.0(L) 31.7(L)  Platelets 150 - 400 K/uL 302 285 287   CMP Latest Ref Rng & Units 10/27/2020 10/14/2020 10/07/2020  Glucose 70 - 99 mg/dL 79 94 106(H)  BUN 8 - 23 mg/dL 22 33(H) 17  Creatinine 0.61 - 1.24 mg/dL 1.77(H) 1.78(H) 1.10  Sodium 135 - 145 mmol/L 137 136 139  Potassium 3.5 - 5.1 mmol/L 4.1 4.0 3.9  Chloride 98 - 111 mmol/L 100 99 104  CO2 22 - 32 mmol/L 29 32 30  Calcium 8.9 - 10.3 mg/dL 8.3(L) 8.5(L) 8.0(L)  Total Protein 6.5 - 8.1 g/dL 7.3 6.6 7.2  Total Bilirubin 0.3 - 1.2 mg/dL 0.8 0.4 0.5  Alkaline Phos 38 - 126 U/L 41 39 46  AST 15 -  41 U/L 17 13(L) 16  ALT 0 - 44 U/L '9 15 12    ' DIAGNOSTIC IMAGING:  I have independently reviewed the scans and discussed with the patient. NM PET Image  Initial (PI) Skull Base To Thigh  Result Date: 10/04/2020 CLINICAL DATA:  Initial treatment strategy for small cell lung cancer. EXAM: NUCLEAR MEDICINE PET SKULL BASE TO THIGH TECHNIQUE: 7.8 mCi F-18 FDG was injected intravenously. Full-ring PET imaging was performed from the skull base to thigh after the radiotracer. CT data was obtained and used for attenuation correction and anatomic localization. Fasting blood glucose: 94 mg/dl COMPARISON:  Prior CT scans. FINDINGS: Mediastinal blood pool activity: SUV max 2.24 Liver activity: SUV max NA NECK: No hypermetabolic lymph nodes in the neck. Incidental CT findings: none CHEST: Large left apical lung mass is hypermetabolic with SUV max of 79.39. Findings are highly suspicious for chest wall invasion. There appears to be clear involvement of the apical anterior chest wall musculature (scalene muscle). Highly likely that there is also involvement of the left first and second ribs. MRI chest without and with contrast may be helpful for more definitive evaluation if clinically necessary. No enlarged or hypermetabolic mediastinal hilar lymph nodes. No supraclavicular or axillary adenopathy. No hypermetabolic pulmonary nodules. The 10.5 mm left lower lobe nodule on image number 151/3 does not show any FDG uptake. Incidental CT findings: Stable advanced emphysematous changes and pulmonary scarring. Stable densely calcified right upper lobe granuloma. ABDOMEN/PELVIS: No abnormal hypermetabolic activity within the liver, pancreas, adrenal glands, or spleen. No hypermetabolic lymph nodes in the abdomen or pelvis. Incidental CT findings: Age advanced atherosclerotic calcifications involving the aorta and iliac arteries. Moderate stool burden. SKELETON: No focal hypermetabolic activity to suggest skeletal metastasis. Mild uptake around the left shoulder posterior is likely inflammatory change. Incidental CT findings: none IMPRESSION: 1. 8 cm left apical lung mass is  hypermetabolic and consistent with known small cell lung cancer. 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 2. No findings suspicious for mediastinal/hilar adenopathy or metastatic disease involving the abdomen/pelvis or bony structures. 3. No FDG uptake in the left lower lobe pulmonary nodule. 4. Stable age advanced emphysematous changes, pulmonary scarring and vascular disease. Electronically Signed   By: Marijo Sanes M.D.   On: 10/04/2020 14:33   IR IMAGING GUIDED PORT INSERTION  Result Date: 09/30/2020 INDICATION: 65 year old male with lung cancer, recommend for chemotherapy EXAM: IMPLANTED PORT A CATH PLACEMENT WITH ULTRASOUND AND FLUOROSCOPIC GUIDANCE MEDICATIONS: None. ANESTHESIA/SEDATION: Moderate (conscious) sedation was employed during this procedure. A total of Versed 1.5 mg and Fentanyl 50 mcg was administered intravenously. Moderate Sedation Time: 27 minutes. The patient's level of consciousness and vital signs were monitored continuously by radiology nursing throughout the procedure under my direct supervision. FLUOROSCOPY TIME:  0 minutes, 6 seconds (1 mGy) COMPLICATIONS: None immediate. PROCEDURE: The procedure, risks, benefits, and alternatives were explained to the patient. Questions regarding the procedure were encouraged and answered. The patient understands and consents to the procedure. The right neck and chest were prepped with chlorhexidine in a sterile fashion, and a sterile drape was applied covering the operative field. Maximum barrier sterile technique with sterile gowns and gloves were used for the procedure. A timeout was performed prior to the initiation of the procedure. Local anesthesia was provided with 1% lidocaine with epinephrine. After creating a small venotomy incision, a micropuncture kit was utilized to access the internal jugular vein under direct, real-time ultrasound guidance. Ultrasound image  documentation was  performed. The microwire was kinked to measure appropriate catheter length. A subcutaneous port pocket was then created along the upper chest wall utilizing a combination of sharp and blunt dissection. The pocket was irrigated with sterile saline. A single lumen ISP power injectable port was chosen for placement. The 8 Fr catheter was tunneled from the port pocket site to the venotomy incision. The port was placed in the pocket. The external catheter was trimmed to appropriate length. At the venotomy, an 8 Fr peel-away sheath was placed over a guidewire under fluoroscopic guidance. The catheter was then placed through the sheath and the sheath was removed. Final catheter positioning was confirmed and documented with a fluoroscopic spot radiograph. The port was accessed with a Huber needle, aspirated and flushed with heparinized saline. The port pocket incision was closed with interrupted 3-0 Vicryl suture then Dermabond was applied, including at the venotomy incision. Dressings were placed. The patient tolerated the procedure well without immediate post procedural complication. IMPRESSION: Successful placement of a right internal jugular approach power-injectable Port-A-Cath. The catheter is ready for immediate use. Michaelle Birks, MD [facility] Radiology Electronically Signed   By: Michaelle Birks MD   On: 09/30/2020 16:45     ASSESSMENT:  1.  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  IHC shows positivity for TTF-1, synaptophysin and chromogranin (dot-like positivity), consistent with small cell lung cancer. - MRI of the brain without contrast on 09/02/2020-no metastasis. - PET scan on 10/02/2020 with 8 cm left apical lung mass, hypermetabolic, highly suspicious for Pancoast tumor with chest wall invasion.  No findings suggestive of mediastinal/hilar adenopathy or other  metastatic disease. - 15 pound weight loss in the last 2 months. - MR C-spine on 09/02/2020 with the left apical lung mass likely involving the left brachial plexus.  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  Severe bilateral neural foraminal stenosis at C3-C4. - Cycle 1 of cisplatin and etoposide on 10/07/2020. - XRT started on 10/23/2020.  2.  Social/family history: - Quit smoking on 08/05/2020.  He was a Administrator. - He lives by himself. - Paternal aunt died of cancer 36 years ago.  Type unknown.   PLAN:  1.  Stage IIIa (T4 N0 M0) small cell lung cancer: - He has tolerated first cycle of cisplatin and etoposide reasonably well.  No major GI side effects. - Radiation therapy was started on 10/23/2020. - He denies any numbness/tingling or ringing in the ears. - His white count was low at 2.3 with ANC of 0.2.  He received G-CSF 480 mcg yesterday. - Today his white count improved to 4.5 with ANC of 2000.  Platelet count is adequate.  LFTs are within normal limits. - We will proceed with cycle 2 today with splitting the dose of cisplatin over 2 days. - RTC 3 weeks for follow-up.  2.  Left shoulder and arm pain: - This is from involvement of brachial plexus. - Pain has improved. - Continue methadone 10 mg 1 tablet daily.  3.  Constipation: - Continue stool softener. - Continue Movantik 25 mg daily which is helping.  4.  Weight loss: - He lost about 7 pounds in the last 3 weeks. - He reports that his appetite has been very good.  He is drinking 1 Ensure per day. - We will  increase Ensure to 3 times a day after food.  5.  Elevated creatinine: - He developed elevated creatinine of 1.7. - We will give him pre and post hydration.  We will split cisplatin dose today 1 and 2.  I have encouraged him to drink about 3 L of water daily. - We will also schedule him for weekly hydration with BMP.  6.   Hypothyroidism: - TSH 96 on 09/03/2020. - Medication list shows 2 tablets of Synthroid 125 mcg daily. - Patient reports that he has been taking 1 tablet daily. - We will consider repeating TSH next visit.  Addendum: - He reportedly urinated before coming to our office.  He urinates only 4 times a day. - He had urinated about 75 mL once and 110 mL second time.  We did a bladder scan which showed about 75 cc of urine.  We have given him a container to collect overnight urine output. - We will check his BMP tomorrow.  I have encouraged him to drink 3 L of water.   Orders placed this encounter:  No orders of the defined types were placed in this encounter.  Total time spent is 40 minutes with more than 50% of the time spent face-to-face discussing management plan, counseling and coordination of care.  Derek Jack, MD [place] [phone]   I, Thana Ates, am acting as a scribe for Dr. Derek Jack.  I, Derek Jack MD, have reviewed the above documentation for accuracy and completeness, and I agree with the above.

## 2020-10-28 ENCOUNTER — Inpatient Hospital Stay (HOSPITAL_COMMUNITY): Payer: Medicare Other

## 2020-10-28 ENCOUNTER — Other Ambulatory Visit (HOSPITAL_COMMUNITY): Payer: Medicare Other | Admitting: General Practice

## 2020-10-28 ENCOUNTER — Ambulatory Visit (HOSPITAL_COMMUNITY): Payer: Medicare Other

## 2020-10-28 ENCOUNTER — Ambulatory Visit: Payer: Medicare Other

## 2020-10-28 ENCOUNTER — Inpatient Hospital Stay (HOSPITAL_BASED_OUTPATIENT_CLINIC_OR_DEPARTMENT_OTHER): Payer: Medicare Other | Admitting: Hematology

## 2020-10-28 VITALS — BP 115/64 | HR 73 | Temp 97.1°F | Resp 17 | Wt 138.4 lb

## 2020-10-28 VITALS — BP 97/65 | HR 71 | Temp 97.1°F | Resp 18

## 2020-10-28 DIAGNOSIS — C3492 Malignant neoplasm of unspecified part of left bronchus or lung: Secondary | ICD-10-CM | POA: Diagnosis not present

## 2020-10-28 DIAGNOSIS — C3490 Malignant neoplasm of unspecified part of unspecified bronchus or lung: Secondary | ICD-10-CM

## 2020-10-28 DIAGNOSIS — R64 Cachexia: Secondary | ICD-10-CM

## 2020-10-28 DIAGNOSIS — Z5111 Encounter for antineoplastic chemotherapy: Secondary | ICD-10-CM | POA: Diagnosis not present

## 2020-10-28 DIAGNOSIS — C3412 Malignant neoplasm of upper lobe, left bronchus or lung: Secondary | ICD-10-CM

## 2020-10-28 LAB — CBC WITH DIFFERENTIAL/PLATELET
Band Neutrophils: 10 %
Basophils Absolute: 0 10*3/uL (ref 0.0–0.1)
Basophils Relative: 0 %
Eosinophils Absolute: 0.1 10*3/uL (ref 0.0–0.5)
Eosinophils Relative: 2 %
HCT: 32.8 % — ABNORMAL LOW (ref 39.0–52.0)
Hemoglobin: 10.8 g/dL — ABNORMAL LOW (ref 13.0–17.0)
Lymphocytes Relative: 31 %
Lymphs Abs: 1.4 10*3/uL (ref 0.7–4.0)
MCH: 32.7 pg (ref 26.0–34.0)
MCHC: 32.9 g/dL (ref 30.0–36.0)
MCV: 99.4 fL (ref 80.0–100.0)
Metamyelocytes Relative: 3 %
Monocytes Absolute: 0.9 10*3/uL (ref 0.1–1.0)
Monocytes Relative: 20 %
Neutro Abs: 2 10*3/uL (ref 1.7–7.7)
Neutrophils Relative %: 34 %
Platelets: 302 10*3/uL (ref 150–400)
RBC: 3.3 MIL/uL — ABNORMAL LOW (ref 4.22–5.81)
RDW: 15.5 % (ref 11.5–15.5)
WBC: 4.5 10*3/uL (ref 4.0–10.5)
nRBC: 0 % (ref 0.0–0.2)

## 2020-10-28 MED ORDER — SODIUM CHLORIDE 0.9 % IV SOLN
40.0000 mg/m2 | Freq: Once | INTRAVENOUS | Status: AC
  Administered 2020-10-28: 71 mg via INTRAVENOUS
  Filled 2020-10-28: qty 71

## 2020-10-28 MED ORDER — SODIUM CHLORIDE 0.9 % IV SOLN
10.0000 mg | Freq: Once | INTRAVENOUS | Status: AC
  Administered 2020-10-28: 10 mg via INTRAVENOUS
  Filled 2020-10-28: qty 10

## 2020-10-28 MED ORDER — HEPARIN SOD (PORK) LOCK FLUSH 100 UNIT/ML IV SOLN
500.0000 [IU] | Freq: Once | INTRAVENOUS | Status: AC | PRN
  Administered 2020-10-28: 500 [IU]

## 2020-10-28 MED ORDER — PALONOSETRON HCL INJECTION 0.25 MG/5ML
0.2500 mg | Freq: Once | INTRAVENOUS | Status: AC
  Administered 2020-10-28: 0.25 mg via INTRAVENOUS
  Filled 2020-10-28: qty 5

## 2020-10-28 MED ORDER — SODIUM CHLORIDE 0.9 % IV SOLN
70.0000 mg/m2 | Freq: Once | INTRAVENOUS | Status: AC
  Administered 2020-10-28: 120 mg via INTRAVENOUS
  Filled 2020-10-28: qty 6

## 2020-10-28 MED ORDER — SODIUM CHLORIDE 0.9 % IV SOLN: INTRAVENOUS | Status: DC

## 2020-10-28 MED ORDER — POTASSIUM CHLORIDE IN NACL 20-0.9 MEQ/L-% IV SOLN
Freq: Once | INTRAVENOUS | Status: AC
  Filled 2020-10-28: qty 1000

## 2020-10-28 MED ORDER — SODIUM CHLORIDE 0.9% FLUSH
10.0000 mL | INTRAVENOUS | Status: DC | PRN
  Administered 2020-10-28: 10 mL

## 2020-10-28 MED ORDER — SODIUM CHLORIDE 0.9 % IV SOLN
150.0000 mg | Freq: Once | INTRAVENOUS | Status: AC
  Administered 2020-10-28: 150 mg via INTRAVENOUS
  Filled 2020-10-28: qty 150

## 2020-10-28 MED ORDER — MAGNESIUM SULFATE 2 GM/50ML IV SOLN
2.0000 g | Freq: Once | INTRAVENOUS | Status: AC
  Administered 2020-10-28: 2 g via INTRAVENOUS
  Filled 2020-10-28: qty 50

## 2020-10-28 NOTE — Patient Instructions (Addendum)
[place] at [facility] Discharge Instructions  You were seen today by Dr. Delton Coombes. He went over your recent results, and you received your treatment. Increase the amount of Ensure you drink to 2 bottles a day. You will be scheduled to return to the clinic next week for labs and to receive fluids. Dr. Delton Coombes will see you back in 3 weeks for labs and follow up.   Thank you for choosing [place] at [facility] to provide your oncology and hematology care.  To afford each patient quality time with our provider, please arrive at least 15 minutes before your scheduled appointment time.   If you have a lab appointment with the [facility] please come in thru the Main Entrance and check in at the main information desk  You need to re-schedule your appointment should you arrive 10 or more minutes late.  We strive to give you quality time with our providers, and arriving late affects you and other patients whose appointments are after yours.  Also, if you no show three or more times for appointments you may be dismissed from the clinic at the providers discretion.     Again, thank you for choosing [facility].  Our hope is that these requests will decrease the amount of time that you wait before being seen by our physicians.       _____________________________________________________________  Should you have questions after your visit to [facility], please contact our office at [phone] between the hours of 8:00 a.m. and 4:30 p.m.  Voicemails left after 4:00 p.m. will not be returned until the following business day.  For prescription refill requests, have your pharmacy contact our office and allow 72 hours.    Cancer Center Support Programs:   > Cancer Support Group  2nd Tuesday of the month 1pm-2pm, Journey Room

## 2020-10-28 NOTE — Patient Instructions (Signed)
[place]  Discharge Instructions: Thank you for choosing [facility] to provide your oncology and hematology care.  If you have a lab appointment with the [facility], please come in thru the Main Entrance and check in at the main information desk.  Wear comfortable clothing and clothing appropriate for easy access to any Portacath or PICC line.   We strive to give you quality time with your provider. You may need to reschedule your appointment if you arrive late (15 or more minutes).  Arriving late affects you and other patients whose appointments are after yours.  Also, if you miss three or more appointments without notifying the office, you may be dismissed from the clinic at the provider's discretion.      For prescription refill requests, have your pharmacy contact our office and allow 72 hours for refills to be completed.    Today you received the following chemotherapy and/or immunotherapy agents Etoposide, Cisplatin.       To help prevent nausea and vomiting after your treatment, we encourage you to take your nausea medication as directed.  BELOW ARE SYMPTOMS THAT SHOULD BE REPORTED IMMEDIATELY: *FEVER GREATER THAN 100.4 F (38 C) OR HIGHER *CHILLS OR SWEATING *NAUSEA AND VOMITING THAT IS NOT CONTROLLED WITH YOUR NAUSEA MEDICATION *UNUSUAL SHORTNESS OF BREATH *UNUSUAL BRUISING OR BLEEDING *URINARY PROBLEMS (pain or burning when urinating, or frequent urination) *BOWEL PROBLEMS (unusual diarrhea, constipation, pain near the anus) TENDERNESS IN MOUTH AND THROAT WITH OR WITHOUT PRESENCE OF ULCERS (sore throat, sores in mouth, or a toothache) UNUSUAL RASH, SWELLING OR PAIN  UNUSUAL VAGINAL DISCHARGE OR ITCHING   Items with * indicate a potential emergency and should be followed up as soon as possible or go to the Emergency Department if any problems should occur.  Please show the CHEMOTHERAPY ALERT CARD or IMMUNOTHERAPY ALERT CARD at check-in to the  Emergency Department and triage nurse.  Should you have questions after your visit or need to cancel or reschedule your appointment, please contact [facility] [phone]  and follow the prompts.  Office hours are 8:00 a.m. to 4:30 p.m. Monday - Friday. Please note that voicemails left after 4:00 p.m. may not be returned until the following business day.  We are closed weekends and major holidays. You have access to a nurse at all times for urgent questions. Please call the main number to the clinic [phone] and follow the prompts.  For any non-urgent questions, you may also contact your provider using MyChart. We now offer e-Visits for anyone 20 and older to request care online for non-urgent symptoms. For details visit [email or web address].   Also download the MyChart app! Go to the app store, search "MyChart", open the app, select [facility], and log in with your MyChart username and password.  Due to Covid, a mask is required upon entering the hospital/clinic. If you do not have a mask, one will be given to you upon arrival. For doctor visits, patients may have 1 support person aged 3 or older with them. For treatment visits, patients cannot have anyone with them due to current Covid guidelines and our immunocompromised population.

## 2020-10-28 NOTE — Progress Notes (Signed)
Patient has been examined, vital signs and labs have been reviewed by Dr. Delton Coombes. ANC, Creatinine, LFTs, hemoglobin, and platelets are within treatment parameters per Dr. Delton Coombes. Patient is okay to proceed with treatment per M.D.

## 2020-10-28 NOTE — Progress Notes (Signed)
Patient's calculated creatinine clearance today ~37 mL/min. Discussed with Dr. Delton Coombes and etoposide dose reduced to 70 mg/m2 for Cycle 1. Will monitor for further dose reductions at future cycles.   Elita Quick, PharmD PGY1 Ambulatory Care Pharmacy Resident 10/28/2020 11:59 AM

## 2020-10-28 NOTE — Progress Notes (Signed)
Patient presents today for treatment and follow up visit with Dr. Delton Coombes. Vital signs stable. MAR reviewed and updated. Patient has no complaints of any side effects since his last treatment. Message received from Dr. Delton Coombes proceed with treatment if [place] is within parameters. Hydration/ House fluids 1 Liter pre Cisplatin over 2 hours. Post Cisplatin 500 mls of Normal Saline over 1 hour. Give same hydration days 1 - 3 per Dr. Delton Coombes through secure chat. CJones RPH aware. Cisplatin to be given 40mg /m2 today and 40 mg/m2 tomorrow per Dr. Delton Coombes. Cjones RPH aware. Creatinine 1.77 today.   2.0 ANC.   Patient voided 65mls in urinal.   Spoke with Dr. Delton Coombes on the phone and verbal orders received may proceed with Etoposide first, then may proceed with Cisplatin and 500 ml bolus post Cisplatin but have patient remain with [place] until void achieved post treatment.   Voided 110 mls Voided 26mls .   Bladder scan performed at the bedside by [place]. 109mls noted in bladder. Reported results to Dr. Delton Coombes. Verbal orders received to have patient collect all urine through the night and in the morning prior to arriving. Draw a CMP and check creatine . Give hydration tomorrow and Etoposide but hold Cisplatin until MD sees lab results.   Treatment given today per MD orders. Tolerated infusion without adverse affects. Vital signs stable. No complaints at this time. Discharged from clinic ambulatory in stable condition. Alert and oriented x 3. F/U with [facility] as scheduled.

## 2020-10-28 NOTE — Progress Notes (Signed)
Received verbal order from Dr Delton Coombes to:  This cycle give: Cisplatin 40 mg/m2 on days 1 and 2 of this cycle with  Hydration. Day 3 give additional house fluid hydration.  T.O. Dr Rhys Martini, PharmD

## 2020-10-28 NOTE — Progress Notes (Signed)
Patients port flushed without difficulty.  Good blood return noted with no bruising or swelling noted at site.  Stable during access and blood draw.  Patient to remain accessed for treatment. 

## 2020-10-29 ENCOUNTER — Ambulatory Visit
Admission: RE | Admit: 2020-10-29 | Discharge: 2020-10-29 | Disposition: A | Discharge status: Home / Self Care | Payer: Medicare Other | Source: Ambulatory Visit | Attending: Radiation Oncology | Admitting: Radiation Oncology

## 2020-10-29 ENCOUNTER — Inpatient Hospital Stay (HOSPITAL_COMMUNITY): Payer: Medicare Other

## 2020-10-29 ENCOUNTER — Encounter (HOSPITAL_COMMUNITY): Payer: Self-pay

## 2020-10-29 ENCOUNTER — Other Ambulatory Visit: Payer: Self-pay

## 2020-10-29 VITALS — BP 111/87 | HR 74 | Temp 96.8°F | Resp 17

## 2020-10-29 DIAGNOSIS — Z5111 Encounter for antineoplastic chemotherapy: Secondary | ICD-10-CM | POA: Diagnosis not present

## 2020-10-29 DIAGNOSIS — C3490 Malignant neoplasm of unspecified part of unspecified bronchus or lung: Secondary | ICD-10-CM

## 2020-10-29 DIAGNOSIS — Z510 Encounter for antineoplastic radiation therapy: Secondary | ICD-10-CM | POA: Diagnosis not present

## 2020-10-29 DIAGNOSIS — C3492 Malignant neoplasm of unspecified part of left bronchus or lung: Secondary | ICD-10-CM

## 2020-10-29 DIAGNOSIS — C3412 Malignant neoplasm of upper lobe, left bronchus or lung: Secondary | ICD-10-CM

## 2020-10-29 LAB — COMPREHENSIVE METABOLIC PANEL
ALT: 9 U/L (ref 0–44)
AST: 17 U/L (ref 15–41)
Albumin: 3.1 g/dL — ABNORMAL LOW (ref 3.5–5.0)
Alkaline Phosphatase: 42 U/L (ref 38–126)
Anion gap: 5 (ref 5–15)
BUN: 18 mg/dL (ref 8–23)
CO2: 25 mmol/L (ref 22–32)
Calcium: 7.6 mg/dL — ABNORMAL LOW (ref 8.9–10.3)
Chloride: 105 mmol/L (ref 98–111)
Creatinine, Ser: 1.49 mg/dL — ABNORMAL HIGH (ref 0.61–1.24)
GFR, Estimated: 52 mL/min — ABNORMAL LOW (ref >60)
Glucose, Bld: 140 mg/dL — ABNORMAL HIGH (ref 70–99)
Potassium: 4.1 mmol/L (ref 3.5–5.1)
Sodium: 135 mmol/L (ref 135–145)
Total Bilirubin: 0.3 mg/dL (ref 0.3–1.2)
Total Protein: 7 g/dL (ref 6.5–8.1)

## 2020-10-29 MED ORDER — POTASSIUM CHLORIDE IN NACL 20-0.9 MEQ/L-% IV SOLN
Freq: Once | INTRAVENOUS | Status: AC
  Filled 2020-10-29: qty 1000

## 2020-10-29 MED ORDER — SODIUM CHLORIDE 0.9 % IV SOLN
70.0000 mg/m2 | Freq: Once | INTRAVENOUS | Status: AC
  Administered 2020-10-29: 120 mg via INTRAVENOUS
  Filled 2020-10-29: qty 6

## 2020-10-29 MED ORDER — SODIUM CHLORIDE 0.9% FLUSH
10.0000 mL | INTRAVENOUS | Status: DC | PRN
  Administered 2020-10-29: 10 mL

## 2020-10-29 MED ORDER — HEPARIN SOD (PORK) LOCK FLUSH 100 UNIT/ML IV SOLN
500.0000 [IU] | Freq: Once | INTRAVENOUS | Status: AC | PRN
  Administered 2020-10-29: 500 [IU]

## 2020-10-29 MED ORDER — MAGNESIUM SULFATE 2 GM/50ML IV SOLN
2.0000 g | Freq: Once | INTRAVENOUS | Status: AC
  Administered 2020-10-29: 2 g via INTRAVENOUS
  Filled 2020-10-29: qty 50

## 2020-10-29 MED ORDER — SODIUM CHLORIDE 0.9 % IV SOLN: INTRAVENOUS | Status: DC

## 2020-10-29 MED ORDER — SODIUM CHLORIDE 0.9 % IV SOLN
10.0000 mg | Freq: Once | INTRAVENOUS | Status: AC
  Administered 2020-10-29: 10 mg via INTRAVENOUS
  Filled 2020-10-29: qty 10

## 2020-10-29 MED ORDER — SODIUM CHLORIDE 0.9 % IV SOLN: Freq: Once | INTRAVENOUS | Status: AC

## 2020-10-29 MED ORDER — SODIUM CHLORIDE 0.9 % IV SOLN
40.0000 mg/m2 | Freq: Once | INTRAVENOUS | Status: AC
  Administered 2020-10-29: 71 mg via INTRAVENOUS
  Filled 2020-10-29: qty 71

## 2020-10-29 MED ORDER — SODIUM CHLORIDE 0.9% FLUSH
10.0000 mL | Freq: Once | INTRAVENOUS | Status: AC
  Administered 2020-10-29: 10 mL via INTRAVENOUS

## 2020-10-29 NOTE — Progress Notes (Signed)
Verbal order received to start hydration on patient while blood work is pending.   Message received from Dr. Delton Coombes / Janan Ridge RN. Creatinine 1.49. Message received to move forward with Cisplatin today. Patient voided 550 mls prior to arrival and brought urine in this am.    Patient voided 195 mls.    Treatment given today per MD orders. Tolerated infusion without adverse affects. Vital signs stable. No complaints at this time. Discharged from clinic ambulatory in stable condition. Alert and oriented x 3. F/U with [facility] as scheduled.

## 2020-10-29 NOTE — Patient Instructions (Signed)
[place]  Discharge Instructions: Thank you for choosing [facility] to provide your oncology and hematology care.  If you have a lab appointment with the [facility], please come in thru the Main Entrance and check in at the main information desk.  Wear comfortable clothing and clothing appropriate for easy access to any Portacath or PICC line.   We strive to give you quality time with your provider. You may need to reschedule your appointment if you arrive late (15 or more minutes).  Arriving late affects you and other patients whose appointments are after yours.  Also, if you miss three or more appointments without notifying the office, you may be dismissed from the clinic at the provider's discretion.      For prescription refill requests, have your pharmacy contact our office and allow 72 hours for refills to be completed.    Today you received the following chemotherapy and/or immunotherapy agents Cisplatin/Etoposide.       To help prevent nausea and vomiting after your treatment, we encourage you to take your nausea medication as directed.  BELOW ARE SYMPTOMS THAT SHOULD BE REPORTED IMMEDIATELY: *FEVER GREATER THAN 100.4 F (38 C) OR HIGHER *CHILLS OR SWEATING *NAUSEA AND VOMITING THAT IS NOT CONTROLLED WITH YOUR NAUSEA MEDICATION *UNUSUAL SHORTNESS OF BREATH *UNUSUAL BRUISING OR BLEEDING *URINARY PROBLEMS (pain or burning when urinating, or frequent urination) *BOWEL PROBLEMS (unusual diarrhea, constipation, pain near the anus) TENDERNESS IN MOUTH AND THROAT WITH OR WITHOUT PRESENCE OF ULCERS (sore throat, sores in mouth, or a toothache) UNUSUAL RASH, SWELLING OR PAIN  UNUSUAL VAGINAL DISCHARGE OR ITCHING   Items with * indicate a potential emergency and should be followed up as soon as possible or go to the Emergency Department if any problems should occur.  Please show the CHEMOTHERAPY ALERT CARD or IMMUNOTHERAPY ALERT CARD at check-in to the  Emergency Department and triage nurse.  Should you have questions after your visit or need to cancel or reschedule your appointment, please contact [facility] [phone]  and follow the prompts.  Office hours are 8:00 a.m. to 4:30 p.m. Monday - Friday. Please note that voicemails left after 4:00 p.m. may not be returned until the following business day.  We are closed weekends and major holidays. You have access to a nurse at all times for urgent questions. Please call the main number to the clinic [phone] and follow the prompts.  For any non-urgent questions, you may also contact your provider using MyChart. We now offer e-Visits for anyone 83 and older to request care online for non-urgent symptoms. For details visit [email or web address].   Also download the MyChart app! Go to the app store, search "MyChart", open the app, select [place], and log in with your MyChart username and password.  Due to Covid, a mask is required upon entering the hospital/clinic. If you do not have a mask, one will be given to you upon arrival. For doctor visits, patients may have 1 support person aged 16 or older with them. For treatment visits, patients cannot have anyone with them due to current Covid guidelines and our immunocompromised population.

## 2020-10-30 ENCOUNTER — Ambulatory Visit: Payer: Medicare Other

## 2020-10-30 ENCOUNTER — Ambulatory Visit (HOSPITAL_COMMUNITY): Payer: Medicare Other

## 2020-10-30 DIAGNOSIS — C3492 Malignant neoplasm of unspecified part of left bronchus or lung: Secondary | ICD-10-CM | POA: Insufficient documentation

## 2020-10-30 MED ORDER — SODIUM CHLORIDE 0.9 % IV SOLN
70.0000 mg/m2 | Freq: Once | INTRAVENOUS | Status: AC
  Filled 2020-10-30: qty 6

## 2020-10-31 ENCOUNTER — Inpatient Hospital Stay: Payer: Medicare Other | Attending: Radiation Oncology | Admitting: General Practice

## 2020-10-31 ENCOUNTER — Encounter (HOSPITAL_COMMUNITY): Payer: Self-pay

## 2020-10-31 ENCOUNTER — Inpatient Hospital Stay (HOSPITAL_COMMUNITY): Payer: Medicare Other | Attending: Hematology and Oncology

## 2020-10-31 ENCOUNTER — Ambulatory Visit
Admission: RE | Admit: 2020-10-31 | Discharge: 2020-10-31 | Disposition: A | Discharge status: Home / Self Care | Payer: Medicare Other | Source: Ambulatory Visit | Attending: Radiation Oncology | Admitting: Radiation Oncology

## 2020-10-31 ENCOUNTER — Other Ambulatory Visit: Payer: Self-pay

## 2020-10-31 VITALS — BP 117/83 | HR 73 | Temp 97.5°F | Resp 16

## 2020-10-31 DIAGNOSIS — C3492 Malignant neoplasm of unspecified part of left bronchus or lung: Secondary | ICD-10-CM

## 2020-10-31 DIAGNOSIS — Z5189 Encounter for other specified aftercare: Secondary | ICD-10-CM | POA: Diagnosis not present

## 2020-10-31 DIAGNOSIS — R7989 Other specified abnormal findings of blood chemistry: Secondary | ICD-10-CM | POA: Insufficient documentation

## 2020-10-31 DIAGNOSIS — Z87891 Personal history of nicotine dependence: Secondary | ICD-10-CM | POA: Insufficient documentation

## 2020-10-31 DIAGNOSIS — Z79899 Other long term (current) drug therapy: Secondary | ICD-10-CM | POA: Insufficient documentation

## 2020-10-31 DIAGNOSIS — C3412 Malignant neoplasm of upper lobe, left bronchus or lung: Secondary | ICD-10-CM | POA: Diagnosis present

## 2020-10-31 DIAGNOSIS — Z5111 Encounter for antineoplastic chemotherapy: Secondary | ICD-10-CM | POA: Diagnosis not present

## 2020-10-31 MED ORDER — SODIUM CHLORIDE 0.9 % IV SOLN: Freq: Once | INTRAVENOUS | Status: AC

## 2020-10-31 MED ORDER — HEPARIN SOD (PORK) LOCK FLUSH 100 UNIT/ML IV SOLN
500.0000 [IU] | Freq: Once | INTRAVENOUS | Status: AC | PRN
  Administered 2020-10-31: 500 [IU]

## 2020-10-31 MED ORDER — SODIUM CHLORIDE 0.9 % IV SOLN
10.0000 mg | Freq: Once | INTRAVENOUS | Status: AC
  Administered 2020-10-31: 10 mg via INTRAVENOUS
  Filled 2020-10-31: qty 10

## 2020-10-31 MED ORDER — SODIUM CHLORIDE 0.9% FLUSH
10.0000 mL | INTRAVENOUS | Status: DC | PRN
Start: 2020-10-31 — End: 2020-10-31
  Administered 2020-10-31: 10 mL

## 2020-10-31 MED ORDER — SONAFINE EX EMUL
1.0000 "application " | Freq: Once | CUTANEOUS | Status: AC
  Administered 2020-10-31: 1 via TOPICAL

## 2020-10-31 MED ORDER — SODIUM CHLORIDE 0.9 % IV SOLN
70.0000 mg/m2 | Freq: Once | INTRAVENOUS | Status: AC
  Administered 2020-10-31: 120 mg via INTRAVENOUS
  Filled 2020-10-31: qty 6

## 2020-10-31 NOTE — Progress Notes (Signed)
[place] CSW Progress Notes  Second [employer] given to patient today by [employer].  Spoke w patient by phone - he appreciates the help w gas money.  Edwyna Shell, LCSW Clinical Social Worker Phone:  [phone]

## 2020-10-31 NOTE — Progress Notes (Signed)
Patient presents today for treatment D3 Etoposide and 500 mls Normal Saline hydration infusion. Patient has complaints of fatigue.

## 2020-10-31 NOTE — Progress Notes (Signed)
Pt here for patient teaching.    Pt given Radiation and You booklet, skin care instructions, and Sonafine.    Reviewed areas of pertinence such as fatigue, hair loss, throat changes, cough, and shortness of breath .   Pt able to give teach back of to pat skin, use unscented/gentle soap, and drink plenty of water,  , apply Sonafine bid, and avoid applying anything to skin within 4 hours of treatment.   Pt verbalizes understanding of information given and will contact nursing with any questions or concerns.    [email or web address]

## 2020-10-31 NOTE — Progress Notes (Signed)
Etoposide given today per MD orders. 500 mls of Normal Saline infused over 1 hour. Tolerated infusion without adverse affects. Vital signs stable. No complaints at this time. Discharged from clinic ambulatory in stable condition. Alert and oriented x 3. F/U with [facility] as scheduled.

## 2020-10-31 NOTE — Patient Instructions (Signed)
[place]  Discharge Instructions: Thank you for choosing [facility] to provide your oncology and hematology care.  If you have a lab appointment with the [facility], please come in thru the Main Entrance and check in at the main information desk.  Wear comfortable clothing and clothing appropriate for easy access to any Portacath or PICC line.   We strive to give you quality time with your provider. You may need to reschedule your appointment if you arrive late (15 or more minutes).  Arriving late affects you and other patients whose appointments are after yours.  Also, if you miss three or more appointments without notifying the office, you may be dismissed from the clinic at the provider's discretion.      For prescription refill requests, have your pharmacy contact our office and allow 72 hours for refills to be completed.    Today you received the following chemotherapy and/or immunotherapy agents Etoposide.       To help prevent nausea and vomiting after your treatment, we encourage you to take your nausea medication as directed.  BELOW ARE SYMPTOMS THAT SHOULD BE REPORTED IMMEDIATELY: *FEVER GREATER THAN 100.4 F (38 C) OR HIGHER *CHILLS OR SWEATING *NAUSEA AND VOMITING THAT IS NOT CONTROLLED WITH YOUR NAUSEA MEDICATION *UNUSUAL SHORTNESS OF BREATH *UNUSUAL BRUISING OR BLEEDING *URINARY PROBLEMS (pain or burning when urinating, or frequent urination) *BOWEL PROBLEMS (unusual diarrhea, constipation, pain near the anus) TENDERNESS IN MOUTH AND THROAT WITH OR WITHOUT PRESENCE OF ULCERS (sore throat, sores in mouth, or a toothache) UNUSUAL RASH, SWELLING OR PAIN  UNUSUAL VAGINAL DISCHARGE OR ITCHING   Items with * indicate a potential emergency and should be followed up as soon as possible or go to the Emergency Department if any problems should occur.  Please show the CHEMOTHERAPY ALERT CARD or IMMUNOTHERAPY ALERT CARD at check-in to the Emergency  Department and triage nurse.  Should you have questions after your visit or need to cancel or reschedule your appointment, please contact [facility] [phone]  and follow the prompts.  Office hours are 8:00 a.m. to 4:30 p.m. Monday - Friday. Please note that voicemails left after 4:00 p.m. may not be returned until the following business day.  We are closed weekends and major holidays. You have access to a nurse at all times for urgent questions. Please call the main number to the clinic [phone] and follow the prompts.  For any non-urgent questions, you may also contact your provider using MyChart. We now offer e-Visits for anyone 49 and older to request care online for non-urgent symptoms. For details visit [email or web address].   Also download the MyChart app! Go to the app store, search "MyChart", open the app, select [place], and log in with your MyChart username and password.  Due to Covid, a mask is required upon entering the hospital/clinic. If you do not have a mask, one will be given to you upon arrival. For doctor visits, patients may have 1 support person aged 11 or older with them. For treatment visits, patients cannot have anyone with them due to current Covid guidelines and our immunocompromised population.

## 2020-11-04 ENCOUNTER — Ambulatory Visit
Admission: RE | Admit: 2020-11-04 | Discharge: 2020-11-04 | Disposition: A | Discharge status: Home / Self Care | Payer: Medicare Other | Source: Ambulatory Visit | Attending: Radiation Oncology | Admitting: Radiation Oncology

## 2020-11-04 ENCOUNTER — Other Ambulatory Visit: Payer: Self-pay

## 2020-11-04 DIAGNOSIS — C3492 Malignant neoplasm of unspecified part of left bronchus or lung: Secondary | ICD-10-CM | POA: Diagnosis not present

## 2020-11-05 ENCOUNTER — Inpatient Hospital Stay (HOSPITAL_COMMUNITY): Payer: Medicare Other

## 2020-11-05 ENCOUNTER — Ambulatory Visit
Admission: RE | Admit: 2020-11-05 | Discharge: 2020-11-05 | Disposition: A | Discharge status: Home / Self Care | Payer: Medicare Other | Source: Ambulatory Visit | Attending: Radiation Oncology | Admitting: Radiation Oncology

## 2020-11-05 ENCOUNTER — Encounter (HOSPITAL_COMMUNITY): Payer: Self-pay

## 2020-11-05 ENCOUNTER — Other Ambulatory Visit: Payer: Self-pay | Admitting: Radiation Oncology

## 2020-11-05 DIAGNOSIS — C3492 Malignant neoplasm of unspecified part of left bronchus or lung: Secondary | ICD-10-CM

## 2020-11-05 DIAGNOSIS — Z5111 Encounter for antineoplastic chemotherapy: Secondary | ICD-10-CM | POA: Diagnosis not present

## 2020-11-05 LAB — BASIC METABOLIC PANEL
Anion gap: 9 (ref 5–15)
BUN: 46 mg/dL — ABNORMAL HIGH (ref 8–23)
CO2: 30 mmol/L (ref 22–32)
Calcium: 7.5 mg/dL — ABNORMAL LOW (ref 8.9–10.3)
Chloride: 99 mmol/L (ref 98–111)
Creatinine, Ser: 2.45 mg/dL — ABNORMAL HIGH (ref 0.61–1.24)
GFR, Estimated: 29 mL/min — ABNORMAL LOW (ref >60)
Glucose, Bld: 92 mg/dL (ref 70–99)
Potassium: 4.3 mmol/L (ref 3.5–5.1)
Sodium: 138 mmol/L (ref 135–145)

## 2020-11-05 MED ORDER — SODIUM CHLORIDE 0.9% FLUSH
10.0000 mL | Freq: Once | INTRAVENOUS | Status: AC
  Administered 2020-11-05: 10 mL

## 2020-11-05 MED ORDER — SUCRALFATE 1 G PO TABS: 1.0000 g | ORAL_TABLET | Freq: Three times a day (TID) | ORAL | 1 refills | Status: DC

## 2020-11-05 MED ORDER — HEPARIN SOD (PORK) LOCK FLUSH 100 UNIT/ML IV SOLN
500.0000 [IU] | Freq: Once | INTRAVENOUS | Status: AC
  Administered 2020-11-05: 500 [IU] via INTRAVENOUS

## 2020-11-05 MED ORDER — SODIUM CHLORIDE 0.9 % IV SOLN: Freq: Once | INTRAVENOUS | Status: AC

## 2020-11-05 NOTE — Progress Notes (Signed)
Patient presents today for lab work and possible fluids. Creatinine today 2.45. Per Janan Ridge RN / Dr. Delton Coombes. Patient to return Thursday, and Friday for fluids due to creatinine levels. Appointments made and schedule printed off for patient. Anastasio Champion RN at the bedside. Plan of care discussed with patient. Understanding verbalized by patient. Patient to return Monday on 11/10/20 for recheck on creatinine level and possible fluids. Understanding verbalized.   1 Liter of Normal Saline over 2 hours given today per MD orders. Tolerated infusion without adverse affects. Vital signs stable. No complaints at this time. Discharged from clinic ambulatory in stable condition. Alert and oriented x 3. F/U with [facility] as scheduled.

## 2020-11-05 NOTE — Progress Notes (Signed)
Patients port flushed without difficulty.  Good blood return noted with no bruising or swelling noted at site.  Stable during access and blood draw.  Patient to remain accessed for treatment. 

## 2020-11-06 ENCOUNTER — Inpatient Hospital Stay (HOSPITAL_COMMUNITY): Payer: Medicare Other

## 2020-11-06 ENCOUNTER — Ambulatory Visit: Payer: Medicare Other

## 2020-11-06 ENCOUNTER — Encounter (HOSPITAL_COMMUNITY): Payer: Self-pay

## 2020-11-06 ENCOUNTER — Other Ambulatory Visit: Payer: Self-pay

## 2020-11-06 DIAGNOSIS — Z5111 Encounter for antineoplastic chemotherapy: Secondary | ICD-10-CM | POA: Diagnosis not present

## 2020-11-06 MED ORDER — HEPARIN SOD (PORK) LOCK FLUSH 100 UNIT/ML IV SOLN
500.0000 [IU] | Freq: Once | INTRAVENOUS | Status: AC
  Administered 2020-11-06: 500 [IU] via INTRAVENOUS

## 2020-11-06 MED ORDER — LACTULOSE 20 GM/30ML PO SOLN: 30.0000 mL | Freq: Every day | ORAL | 2 refills | Status: DC

## 2020-11-06 MED ORDER — SODIUM CHLORIDE 0.9 % IV SOLN: Freq: Once | INTRAVENOUS | Status: AC

## 2020-11-06 MED ORDER — SODIUM CHLORIDE 0.9% FLUSH
10.0000 mL | Freq: Once | INTRAVENOUS | Status: AC
  Administered 2020-11-06: 10 mL

## 2020-11-06 NOTE — Progress Notes (Signed)
1 Liter of NS  given today per MD orders. Tolerated infusion without adverse affects. Vital signs stable. No complaints at this time. Discharged from clinic ambulatory in stable condition. Alert and oriented x 3. F/U with [facility] as scheduled.

## 2020-11-06 NOTE — Patient Instructions (Signed)
[place]  Discharge Instructions: Thank you for choosing [facility] [place] to provide your oncology and hematology care.  If you have a lab appointment with the [facility], please come in thru the Main Entrance and check in at the main information desk.  Wear comfortable clothing and clothing appropriate for easy access to any Portacath or PICC line.   We strive to give you quality time with your provider. You may need to reschedule your appointment if you arrive late (15 or more minutes).  Arriving late affects you and other patients whose appointments are after yours.  Also, if you miss three or more appointments without notifying the office, you may be dismissed from the clinic at the provider's discretion.      For prescription refill requests, have your pharmacy contact our office and allow 72 hours for refills to be completed.    Today you received IV fluids.   To help prevent nausea and vomiting after your treatment, we encourage you to take your nausea medication as directed.  BELOW ARE SYMPTOMS THAT SHOULD BE REPORTED IMMEDIATELY: *FEVER GREATER THAN 100.4 F (38 C) OR HIGHER *CHILLS OR SWEATING *NAUSEA AND VOMITING THAT IS NOT CONTROLLED WITH YOUR NAUSEA MEDICATION *UNUSUAL SHORTNESS OF BREATH *UNUSUAL BRUISING OR BLEEDING *URINARY PROBLEMS (pain or burning when urinating, or frequent urination) *BOWEL PROBLEMS (unusual diarrhea, constipation, pain near the anus) TENDERNESS IN MOUTH AND THROAT WITH OR WITHOUT PRESENCE OF ULCERS (sore throat, sores in mouth, or a toothache) UNUSUAL RASH, SWELLING OR PAIN  UNUSUAL VAGINAL DISCHARGE OR ITCHING   Items with * indicate a potential emergency and should be followed up as soon as possible or go to the Emergency Department if any problems should occur.  Please show the CHEMOTHERAPY ALERT CARD or IMMUNOTHERAPY ALERT CARD at check-in to the Emergency Department and triage nurse.  Should you have questions after  your visit or need to cancel or reschedule your appointment, please contact [facility] [phone]  and follow the prompts.  Office hours are 8:00 a.m. to 4:30 p.m. Monday - Friday. Please note that voicemails left after 4:00 p.m. may not be returned until the following business day.  We are closed weekends and major holidays. You have access to a nurse at all times for urgent questions. Please call the main number to the clinic [phone] and follow the prompts.  For any non-urgent questions, you may also contact your provider using MyChart. We now offer e-Visits for anyone 64 and older to request care online for non-urgent symptoms. For details visit [email or web address].   Also download the MyChart app! Go to the app store, search "MyChart", open the app, select [place], and log in with your MyChart username and password.  Due to Covid, a mask is required upon entering the hospital/clinic. If you do not have a mask, one will be given to you upon arrival. For doctor visits, patients may have 1 support person aged 38 or older with them. For treatment visits, patients cannot have anyone with them due to current Covid guidelines and our immunocompromised population.

## 2020-11-06 NOTE — Progress Notes (Signed)
11:45 am Called patient's mobile number to check on patient due to patient has not arrived for IV fluids. Patient does not have his cell phone set up to receive voice mails.  11:49 am  Called patient's home number and no answer. Unable to leave messages with the patient on either number. Patient teaching performed on 11/05/20 of the importance of IV fluids and patient's kidney functions elevated and plan of care. Understanding verbalized by patient and arrangement for transportation confirmed by patient on 11/05/20.  Patient left accessed, Biop[facility] in place 11/05/20.   Patient presents today for 1 Liter of NS over 2 hours. Vital signs stable. Patient has no complaints at this time.

## 2020-11-07 ENCOUNTER — Ambulatory Visit: Payer: Medicare Other

## 2020-11-07 ENCOUNTER — Inpatient Hospital Stay (HOSPITAL_COMMUNITY): Payer: Medicare Other

## 2020-11-07 DIAGNOSIS — Z5111 Encounter for antineoplastic chemotherapy: Secondary | ICD-10-CM | POA: Diagnosis not present

## 2020-11-07 MED ORDER — SODIUM CHLORIDE 0.9 % IV SOLN: Freq: Once | INTRAVENOUS | Status: AC

## 2020-11-07 MED ORDER — SODIUM CHLORIDE 0.9% FLUSH
10.0000 mL | Freq: Once | INTRAVENOUS | Status: AC
  Administered 2020-11-07: 10 mL via INTRAVENOUS

## 2020-11-07 MED ORDER — HEPARIN SOD (PORK) LOCK FLUSH 100 UNIT/ML IV SOLN
500.0000 [IU] | Freq: Once | INTRAVENOUS | Status: AC
  Administered 2020-11-07: 500 [IU] via INTRAVENOUS

## 2020-11-07 NOTE — Patient Instructions (Signed)
[place]  Discharge Instructions: Thank you for choosing [facility] to provide your oncology and hematology care.  If you have a lab appointment with the [facility], please come in thru the Main Entrance and check in at the main information desk.  Wear comfortable clothing and clothing appropriate for easy access to any Portacath or PICC line.   We strive to give you quality time with your provider. You may need to reschedule your appointment if you arrive late (15 or more minutes).  Arriving late affects you and other patients whose appointments are after yours.  Also, if you miss three or more appointments without notifying the office, you may be dismissed from the clinic at the provider's discretion.      For prescription refill requests, have your pharmacy contact our office and allow 72 hours for refills to be completed.    Today you received IV fluids.     BELOW ARE SYMPTOMS THAT SHOULD BE REPORTED IMMEDIATELY: *FEVER GREATER THAN 100.4 F (38 C) OR HIGHER *CHILLS OR SWEATING *NAUSEA AND VOMITING THAT IS NOT CONTROLLED WITH YOUR NAUSEA MEDICATION *UNUSUAL SHORTNESS OF BREATH *UNUSUAL BRUISING OR BLEEDING *URINARY PROBLEMS (pain or burning when urinating, or frequent urination) *BOWEL PROBLEMS (unusual diarrhea, constipation, pain near the anus) TENDERNESS IN MOUTH AND THROAT WITH OR WITHOUT PRESENCE OF ULCERS (sore throat, sores in mouth, or a toothache) UNUSUAL RASH, SWELLING OR PAIN  UNUSUAL VAGINAL DISCHARGE OR ITCHING   Items with * indicate a potential emergency and should be followed up as soon as possible or go to the Emergency Department if any problems should occur.  Please show the CHEMOTHERAPY ALERT CARD or IMMUNOTHERAPY ALERT CARD at check-in to the Emergency Department and triage nurse.  Should you have questions after your visit or need to cancel or reschedule your appointment, please contact [facility] [phone]   and follow the prompts.  Office hours are 8:00 a.m. to 4:30 p.m. Monday - Friday. Please note that voicemails left after 4:00 p.m. may not be returned until the following business day.  We are closed weekends and major holidays. You have access to a nurse at all times for urgent questions. Please call the main number to the clinic [phone] and follow the prompts.  For any non-urgent questions, you may also contact your provider using MyChart. We now offer e-Visits for anyone 64 and older to request care online for non-urgent symptoms. For details visit [email or web address].   Also download the MyChart app! Go to the app store, search "MyChart", open the app, select [place], and log in with your MyChart username and password.  Due to Covid, a mask is required upon entering the hospital/clinic. If you do not have a mask, one will be given to you upon arrival. For doctor visits, patients may have 1 support person aged 3 or older with them. For treatment visits, patients cannot have anyone with them due to current Covid guidelines and our immunocompromised population.

## 2020-11-07 NOTE — Progress Notes (Signed)
Chaplain engaged in a follow-up visit with Miguel Colon.  He shared about his weekend and his mom visiting with him for the next month.  Chaplain was able to meet his mom, Miguel Colon.  Miguel Colon also talked about the impact of radiation on his body and the fluids he needs for his kidney function.    Chaplain offered listening, presence, and support.      11/07/20 1100  Clinical Encounter Type  Visited With Patient  Visit Type Follow-up

## 2020-11-07 NOTE — Progress Notes (Signed)
Pt presents today for IV fluids NS over 2 hours per provider's order. Vital signs stable and pt voiced no new complaints at this time.  IV fluids given today per MD orders. Tolerated infusion without adverse affects. Vital signs stable. No complaints at this time. Discharged from clinic ambulatory in stable condition. Alert and oriented x 3. F/U with [facility] as scheduled.

## 2020-11-10 ENCOUNTER — Encounter (HOSPITAL_COMMUNITY): Payer: Self-pay

## 2020-11-10 ENCOUNTER — Ambulatory Visit
Admission: RE | Admit: 2020-11-10 | Discharge: 2020-11-10 | Disposition: A | Discharge status: Home / Self Care | Payer: Medicare Other | Source: Ambulatory Visit | Attending: Radiation Oncology | Admitting: Radiation Oncology

## 2020-11-10 ENCOUNTER — Other Ambulatory Visit: Payer: Self-pay

## 2020-11-10 ENCOUNTER — Inpatient Hospital Stay (HOSPITAL_COMMUNITY): Payer: Medicare Other

## 2020-11-10 DIAGNOSIS — Z5111 Encounter for antineoplastic chemotherapy: Secondary | ICD-10-CM | POA: Diagnosis not present

## 2020-11-10 DIAGNOSIS — C3492 Malignant neoplasm of unspecified part of left bronchus or lung: Secondary | ICD-10-CM

## 2020-11-10 LAB — BASIC METABOLIC PANEL WITH GFR
Anion gap: 6 (ref 5–15)
BUN: 26 mg/dL — ABNORMAL HIGH (ref 8–23)
CO2: 29 mmol/L (ref 22–32)
Calcium: 7.6 mg/dL — ABNORMAL LOW (ref 8.9–10.3)
Chloride: 103 mmol/L (ref 98–111)
Creatinine, Ser: 2.02 mg/dL — ABNORMAL HIGH (ref 0.61–1.24)
GFR, Estimated: 36 mL/min — ABNORMAL LOW
Glucose, Bld: 85 mg/dL (ref 70–99)
Potassium: 4.1 mmol/L (ref 3.5–5.1)
Sodium: 138 mmol/L (ref 135–145)

## 2020-11-10 MED ORDER — SODIUM CHLORIDE 0.9% FLUSH
10.0000 mL | Freq: Once | INTRAVENOUS | Status: AC
  Administered 2020-11-10: 10 mL

## 2020-11-10 MED ORDER — HEPARIN SOD (PORK) LOCK FLUSH 100 UNIT/ML IV SOLN
500.0000 [IU] | Freq: Once | INTRAVENOUS | Status: AC
  Administered 2020-11-10: 500 [IU] via INTRAVENOUS

## 2020-11-10 MED ORDER — SODIUM CHLORIDE 0.9 % IV SOLN: Freq: Once | INTRAVENOUS | Status: AC

## 2020-11-10 NOTE — Progress Notes (Signed)
Port flushed with good blood return noted. No bruising or swelling at site. Patient awaiting labs for possible fluids.

## 2020-11-10 NOTE — Patient Instructions (Signed)
[place]  Discharge Instructions: Thank you for choosing [facility] to provide your oncology and hematology care.  If you have a lab appointment with the [facility], please come in thru the Main Entrance and check in at the main information desk.  Wear comfortable clothing and clothing appropriate for easy access to any Portacath or PICC line.   We strive to give you quality time with your provider. You may need to reschedule your appointment if you arrive late (15 or more minutes).  Arriving late affects you and other patients whose appointments are after yours.  Also, if you miss three or more appointments without notifying the office, you may be dismissed from the clinic at the provider's discretion.      For prescription refill requests, have your pharmacy contact our office and allow 72 hours for refills to be completed.    Today you received the following: 1 Liter of Normal Saline over 2 hours.       To help prevent nausea and vomiting after your treatment, we encourage you to take your nausea medication as directed.  BELOW ARE SYMPTOMS THAT SHOULD BE REPORTED IMMEDIATELY: *FEVER GREATER THAN 100.4 F (38 C) OR HIGHER *CHILLS OR SWEATING *NAUSEA AND VOMITING THAT IS NOT CONTROLLED WITH YOUR NAUSEA MEDICATION *UNUSUAL SHORTNESS OF BREATH *UNUSUAL BRUISING OR BLEEDING *URINARY PROBLEMS (pain or burning when urinating, or frequent urination) *BOWEL PROBLEMS (unusual diarrhea, constipation, pain near the anus) TENDERNESS IN MOUTH AND THROAT WITH OR WITHOUT PRESENCE OF ULCERS (sore throat, sores in mouth, or a toothache) UNUSUAL RASH, SWELLING OR PAIN  UNUSUAL VAGINAL DISCHARGE OR ITCHING   Items with * indicate a potential emergency and should be followed up as soon as possible or go to the Emergency Department if any problems should occur.  Please show the CHEMOTHERAPY ALERT CARD or IMMUNOTHERAPY ALERT CARD at check-in to the Emergency Department and  triage nurse.  Should you have questions after your visit or need to cancel or reschedule your appointment, please contact [facility] [phone]  and follow the prompts.  Office hours are 8:00 a.m. to 4:30 p.m. Monday - Friday. Please note that voicemails left after 4:00 p.m. may not be returned until the following business day.  We are closed weekends and major holidays. You have access to a nurse at all times for urgent questions. Please call the main number to the clinic [phone] and follow the prompts.  For any non-urgent questions, you may also contact your provider using MyChart. We now offer e-Visits for anyone 83 and older to request care online for non-urgent symptoms. For details visit [email or web address].   Also download the MyChart app! Go to the app store, search "MyChart", open the app, select , and log in with your MyChart username and password.  Due to Covid, a mask is required upon entering the hospital/clinic. If you do not have a mask, one will be given to you upon arrival. For doctor visits, patients may have 1 support person aged 97 or older with them. For treatment visits, patients cannot have anyone with them due to current Covid guidelines and our immunocompromised population.

## 2020-11-10 NOTE — Progress Notes (Signed)
Message sent to [facility] / Dr. Delton Coombes. Creatinine today 2.02. Message received from [facility] / Dr. Delton Coombes patient to return on Tuesday and Wednesday this week for 1 Liter of Normal Saline over 2 hours . Recheck labs on Thursday for possible fluids.   Fluids given today per MD orders. Tolerated infusion without adverse affects. Vital signs stable. No complaints at this time. Discharged from clinic ambulatory in stable condition. Alert and oriented x 3. F/U with [facility] as scheduled.

## 2020-11-11 ENCOUNTER — Ambulatory Visit
Admission: RE | Admit: 2020-11-11 | Discharge: 2020-11-11 | Disposition: A | Discharge status: Home / Self Care | Payer: Medicare Other | Source: Ambulatory Visit | Attending: Radiation Oncology | Admitting: Radiation Oncology

## 2020-11-11 ENCOUNTER — Inpatient Hospital Stay (HOSPITAL_COMMUNITY): Payer: Medicare Other

## 2020-11-11 VITALS — BP 96/61 | HR 80 | Temp 96.3°F | Resp 18

## 2020-11-11 DIAGNOSIS — C3492 Malignant neoplasm of unspecified part of left bronchus or lung: Secondary | ICD-10-CM | POA: Diagnosis not present

## 2020-11-11 DIAGNOSIS — Z5111 Encounter for antineoplastic chemotherapy: Secondary | ICD-10-CM | POA: Diagnosis not present

## 2020-11-11 DIAGNOSIS — D702 Other drug-induced agranulocytosis: Secondary | ICD-10-CM

## 2020-11-11 MED ORDER — HEPARIN SOD (PORK) LOCK FLUSH 100 UNIT/ML IV SOLN
500.0000 [IU] | Freq: Once | INTRAVENOUS | Status: AC
  Administered 2020-11-11: 500 [IU] via INTRAVENOUS

## 2020-11-11 MED ORDER — SODIUM CHLORIDE 0.9 % IV SOLN: Freq: Once | INTRAVENOUS | Status: AC

## 2020-11-11 MED ORDER — SODIUM CHLORIDE 0.9% FLUSH
10.0000 mL | INTRAVENOUS | Status: DC | PRN
  Administered 2020-11-11: 10 mL via INTRAVENOUS

## 2020-11-11 NOTE — Patient Instructions (Signed)
[facility]  Discharge Instructions: Thank you for choosing [facility] to provide your oncology and hematology care.  If you have a lab appointment with the Cancer Center, please come in thru the Main Entrance and check in at the main information desk.  Wear comfortable clothing and clothing appropriate for easy access to any Portacath or PICC line.   We strive to give you quality time with your provider. You may need to reschedule your appointment if you arrive late (15 or more minutes).  Arriving late affects you and other patients whose appointments are after yours.  Also, if you miss three or more appointments without notifying the office, you may be dismissed from the clinic at the provider's discretion.      For prescription refill requests, have your pharmacy contact our office and allow 72 hours for refills to be completed.        To help prevent nausea and vomiting after your treatment, we encourage you to take your nausea medication as directed.  BELOW ARE SYMPTOMS THAT SHOULD BE REPORTED IMMEDIATELY: *FEVER GREATER THAN 100.4 F (38 C) OR HIGHER *CHILLS OR SWEATING *NAUSEA AND VOMITING THAT IS NOT CONTROLLED WITH YOUR NAUSEA MEDICATION *UNUSUAL SHORTNESS OF BREATH *UNUSUAL BRUISING OR BLEEDING *URINARY PROBLEMS (pain or burning when urinating, or frequent urination) *BOWEL PROBLEMS (unusual diarrhea, constipation, pain near the anus) TENDERNESS IN MOUTH AND THROAT WITH OR WITHOUT PRESENCE OF ULCERS (sore throat, sores in mouth, or a toothache) UNUSUAL RASH, SWELLING OR PAIN  UNUSUAL VAGINAL DISCHARGE OR ITCHING   Items with * indicate a potential emergency and should be followed up as soon as possible or go to the Emergency Department if any problems should occur.  Please show the CHEMOTHERAPY ALERT CARD or IMMUNOTHERAPY ALERT CARD at check-in to the Emergency Department and triage nurse.  Should you have questions after your visit or need to cancel  or reschedule your appointment, please contact [facility] [phone]  and follow the prompts.  Office hours are 8:00 a.m. to 4:30 p.m. Monday - Friday. Please note that voicemails left after 4:00 p.m. may not be returned until the following business day.  We are closed weekends and major holidays. You have access to a nurse at all times for urgent questions. Please call the main number to the clinic [phone] and follow the prompts.  For any non-urgent questions, you may also contact your provider using MyChart. We now offer e-Visits for anyone 18 and older to request care online for non-urgent symptoms. For details visit [email or web address].   Also download the MyChart app! Go to the app store, search "MyChart", open the app, select [facility], and log in with your MyChart username and password.  Due to Covid, a mask is required upon entering the hospital/clinic. If you do not have a mask, one will be given to you upon arrival. For doctor visits, patients may have 1 support person aged 18 or older with them. For treatment visits, patients cannot have anyone with them due to current Covid guidelines and our immunocompromised population.  

## 2020-11-11 NOTE — Progress Notes (Signed)
Hydration fluids given per orders. Patient tolerated it well without problems. Vitals stable and discharged home from clinic ambulatory. Follow up as scheduled.

## 2020-11-11 NOTE — Progress Notes (Signed)
Patient presents today for IV fluids.  Patient is in satisfactory condition with no new complaints voiced.  Vital signs are stable.  We will proceed with fluids per MD orders.

## 2020-11-12 ENCOUNTER — Inpatient Hospital Stay (HOSPITAL_COMMUNITY): Payer: Medicare Other

## 2020-11-12 ENCOUNTER — Ambulatory Visit
Admission: RE | Admit: 2020-11-12 | Discharge: 2020-11-12 | Disposition: A | Discharge status: Home / Self Care | Payer: Medicare Other | Source: Ambulatory Visit | Attending: Radiation Oncology | Admitting: Radiation Oncology

## 2020-11-12 ENCOUNTER — Other Ambulatory Visit: Payer: Self-pay

## 2020-11-12 DIAGNOSIS — C3492 Malignant neoplasm of unspecified part of left bronchus or lung: Secondary | ICD-10-CM | POA: Diagnosis not present

## 2020-11-13 ENCOUNTER — Telehealth (HOSPITAL_COMMUNITY): Payer: Self-pay | Admitting: Hematology

## 2020-11-13 ENCOUNTER — Inpatient Hospital Stay (HOSPITAL_COMMUNITY): Payer: Medicare Other

## 2020-11-13 ENCOUNTER — Ambulatory Visit
Admission: RE | Admit: 2020-11-13 | Discharge: 2020-11-13 | Disposition: A | Discharge status: Home / Self Care | Payer: Medicare Other | Source: Ambulatory Visit | Attending: Radiation Oncology | Admitting: Radiation Oncology

## 2020-11-13 ENCOUNTER — Ambulatory Visit (HOSPITAL_COMMUNITY): Payer: Medicare Other

## 2020-11-13 ENCOUNTER — Telehealth: Payer: Self-pay | Admitting: Internal Medicine

## 2020-11-13 DIAGNOSIS — C3492 Malignant neoplasm of unspecified part of left bronchus or lung: Secondary | ICD-10-CM | POA: Diagnosis not present

## 2020-11-13 NOTE — Telephone Encounter (Signed)
   Miguel Colon DOB: [birth year] MRN: [id number]   RIDER WAIVER AND RELEASE OF LIABILITY  For purposes of improving physical access to our facilities, [facility] is pleased to partner with third parties to provide [facility] patients or other authorized individuals the option of convenient, on-demand ground transportation services (the [place]") through use of the technology service that enables users to request on-demand ground transportation from independent third-party providers.  By opting to use and accept these [employer], I, the undersigned, hereby agree on behalf of myself, and on behalf of any minor child using the Government social research officer for whom I am the parent or legal guardian, as follows:  Government social research officer provided to me are provided by independent third-party transportation providers who are not [employer] or employees and who are unaffiliated with [employer]. [facility] is neither a transportation carrier nor a common or public carrier. [facility] has no control over the quality or safety of the transportation that occurs as a result of the [employer]. [facility] cannot guarantee that any third-party transportation provider will complete any arranged transportation service. [facility] makes no representation, warranty, or guarantee regarding the reliability, timeliness, quality, safety, suitability, or availability of any of the Transport Services or that they will be error free. I fully understand that traveling by vehicle involves risks and dangers of serious bodily injury, including permanent disability, paralysis, and death. I agree, on behalf of myself and on behalf of any minor child using the Transport Services for whom I am the parent or legal guardian, that the entire risk arising out of my use of the [employer] remains solely with me, to the maximum extent permitted under applicable law. The [employer] are provided "as is"  and "as available." [facility] disclaims all representations and warranties, express, implied or statutory, not expressly set out in these terms, including the implied warranties of merchantability and fitness for a particular purpose. I hereby waive and release [facility], its agents, employees, officers, directors, representatives, insurers, attorneys, assigns, successors, subsidiaries, and affiliates from any and all past, present, or future claims, demands, liabilities, actions, causes of action, or suits of any kind directly or indirectly arising from acceptance and use of the [employer]. I further waive and release [facility] and its affiliates from all present and future liability and responsibility for any injury or death to persons or damages to property caused by or related to the use of the [employer]. I have read this Waiver and Release of Liability, and I understand the terms used in it and their legal significance. This Waiver is freely and voluntarily given with the understanding that my right (as well as the right of any minor child for whom I am the parent or legal guardian using the [employer]) to legal recourse against  in connection with the [employer] is knowingly surrendered in return for use of these services.   I attest that I read the consent document to Melina Fiddler, gave Mr. Poke the opportunity to ask questions and answered the questions asked (if any). I affirm that Melina Fiddler then provided consent for he's participation in this program.      Legrand Pitts

## 2020-11-13 NOTE — Telephone Encounter (Signed)
Enrolled pt into [employer]. Set up transportation appts  for the week of 9/19-9/23

## 2020-11-14 ENCOUNTER — Ambulatory Visit: Payer: Medicare Other

## 2020-11-14 ENCOUNTER — Ambulatory Visit (HOSPITAL_COMMUNITY): Payer: Medicare Other

## 2020-11-14 NOTE — Progress Notes (Deleted)
Called to speak to patient pertaining to appointment today. Called patient's cell phone and it's not set up to receive voice mail. Unable to leave message.

## 2020-11-17 ENCOUNTER — Ambulatory Visit
Admission: RE | Admit: 2020-11-17 | Discharge: 2020-11-17 | Disposition: A | Discharge status: Home / Self Care | Payer: Medicare Other | Source: Ambulatory Visit | Attending: Radiation Oncology | Admitting: Radiation Oncology

## 2020-11-17 ENCOUNTER — Inpatient Hospital Stay (HOSPITAL_COMMUNITY): Payer: Medicare Other

## 2020-11-17 ENCOUNTER — Other Ambulatory Visit: Payer: Self-pay

## 2020-11-17 VITALS — BP 138/85 | HR 63 | Temp 96.8°F | Resp 17 | Wt 142.4 lb

## 2020-11-17 DIAGNOSIS — C3412 Malignant neoplasm of upper lobe, left bronchus or lung: Secondary | ICD-10-CM

## 2020-11-17 DIAGNOSIS — C3492 Malignant neoplasm of unspecified part of left bronchus or lung: Secondary | ICD-10-CM | POA: Diagnosis not present

## 2020-11-17 DIAGNOSIS — Z5111 Encounter for antineoplastic chemotherapy: Secondary | ICD-10-CM | POA: Diagnosis not present

## 2020-11-17 DIAGNOSIS — D702 Other drug-induced agranulocytosis: Secondary | ICD-10-CM

## 2020-11-17 DIAGNOSIS — C3490 Malignant neoplasm of unspecified part of unspecified bronchus or lung: Secondary | ICD-10-CM

## 2020-11-17 LAB — CBC WITH DIFFERENTIAL/PLATELET
Abs Immature Granulocytes: 0.01 10*3/uL (ref 0.00–0.07)
Basophils Absolute: 0 10*3/uL (ref 0.0–0.1)
Basophils Relative: 1 %
Eosinophils Absolute: 0 10*3/uL (ref 0.0–0.5)
Eosinophils Relative: 1 %
HCT: 27.7 % — ABNORMAL LOW (ref 39.0–52.0)
Hemoglobin: 9.1 g/dL — ABNORMAL LOW (ref 13.0–17.0)
Immature Granulocytes: 1 %
Lymphocytes Relative: 47 %
Lymphs Abs: 0.9 10*3/uL (ref 0.7–4.0)
MCH: 32.7 pg (ref 26.0–34.0)
MCHC: 32.9 g/dL (ref 30.0–36.0)
MCV: 99.6 fL (ref 80.0–100.0)
Monocytes Absolute: 0.3 10*3/uL (ref 0.1–1.0)
Monocytes Relative: 15 %
Neutro Abs: 0.7 10*3/uL — ABNORMAL LOW (ref 1.7–7.7)
Neutrophils Relative %: 35 %
Platelets: 134 10*3/uL — ABNORMAL LOW (ref 150–400)
RBC: 2.78 MIL/uL — ABNORMAL LOW (ref 4.22–5.81)
RDW: 15.3 % (ref 11.5–15.5)
WBC: 1.9 10*3/uL — ABNORMAL LOW (ref 4.0–10.5)
nRBC: 0 % (ref 0.0–0.2)

## 2020-11-17 LAB — COMPREHENSIVE METABOLIC PANEL
ALT: 8 U/L (ref 0–44)
AST: 14 U/L — ABNORMAL LOW (ref 15–41)
Albumin: 3.2 g/dL — ABNORMAL LOW (ref 3.5–5.0)
Alkaline Phosphatase: 34 U/L — ABNORMAL LOW (ref 38–126)
Anion gap: 4 — ABNORMAL LOW (ref 5–15)
BUN: 22 mg/dL (ref 8–23)
CO2: 31 mmol/L (ref 22–32)
Calcium: 7.1 mg/dL — ABNORMAL LOW (ref 8.9–10.3)
Chloride: 103 mmol/L (ref 98–111)
Creatinine, Ser: 1.96 mg/dL — ABNORMAL HIGH (ref 0.61–1.24)
GFR, Estimated: 37 mL/min — ABNORMAL LOW (ref >60)
Glucose, Bld: 92 mg/dL (ref 70–99)
Potassium: 3.9 mmol/L (ref 3.5–5.1)
Sodium: 138 mmol/L (ref 135–145)
Total Bilirubin: 0.3 mg/dL (ref 0.3–1.2)
Total Protein: 7 g/dL (ref 6.5–8.1)

## 2020-11-17 MED ORDER — HEPARIN SOD (PORK) LOCK FLUSH 100 UNIT/ML IV SOLN
500.0000 [IU] | Freq: Once | INTRAVENOUS | Status: AC
  Administered 2020-11-17: 500 [IU] via INTRAVENOUS

## 2020-11-17 MED ORDER — SODIUM CHLORIDE 0.9% FLUSH
10.0000 mL | INTRAVENOUS | Status: DC | PRN
  Administered 2020-11-17: 10 mL via INTRAVENOUS

## 2020-11-17 MED ORDER — SODIUM CHLORIDE 0.9 % IV SOLN: Freq: Once | INTRAVENOUS | Status: AC

## 2020-11-17 NOTE — Progress Notes (Signed)
Patient reports onset of rash last Wednesday after he was doing yard work outside.  He does have some small puncture wounds from thorns, but these appear to be healing.  The area is erythematous, but is not warm to the touch.  There are small raised areas that he reports are mildly pruritic.  Suspect contact dermatitis related to yard work.  No indication for further treatment at this time.  Patient instructed to keep the area clean and dry.  If the redness spreads or he develops fever or chills, he has been instructed to call the clinic, in case there are any signs of cellulitis.  No signs of infection at this time.

## 2020-11-17 NOTE — Patient Instructions (Signed)
[facility]  Discharge Instructions: Thank you for choosing [facility] to provide your oncology and hematology care.  If you have a lab appointment with the Cancer Center, please come in thru the Main Entrance and check in at the main information desk.  Wear comfortable clothing and clothing appropriate for easy access to any Portacath or PICC line.   We strive to give you quality time with your provider. You may need to reschedule your appointment if you arrive late (15 or more minutes).  Arriving late affects you and other patients whose appointments are after yours.  Also, if you miss three or more appointments without notifying the office, you may be dismissed from the clinic at the provider's discretion.      For prescription refill requests, have your pharmacy contact our office and allow 72 hours for refills to be completed.        To help prevent nausea and vomiting after your treatment, we encourage you to take your nausea medication as directed.  BELOW ARE SYMPTOMS THAT SHOULD BE REPORTED IMMEDIATELY: *FEVER GREATER THAN 100.4 F (38 C) OR HIGHER *CHILLS OR SWEATING *NAUSEA AND VOMITING THAT IS NOT CONTROLLED WITH YOUR NAUSEA MEDICATION *UNUSUAL SHORTNESS OF BREATH *UNUSUAL BRUISING OR BLEEDING *URINARY PROBLEMS (pain or burning when urinating, or frequent urination) *BOWEL PROBLEMS (unusual diarrhea, constipation, pain near the anus) TENDERNESS IN MOUTH AND THROAT WITH OR WITHOUT PRESENCE OF ULCERS (sore throat, sores in mouth, or a toothache) UNUSUAL RASH, SWELLING OR PAIN  UNUSUAL VAGINAL DISCHARGE OR ITCHING   Items with * indicate a potential emergency and should be followed up as soon as possible or go to the Emergency Department if any problems should occur.  Please show the CHEMOTHERAPY ALERT CARD or IMMUNOTHERAPY ALERT CARD at check-in to the Emergency Department and triage nurse.  Should you have questions after your visit or need to cancel  or reschedule your appointment, please contact [facility] [phone]  and follow the prompts.  Office hours are 8:00 a.m. to 4:30 p.m. Monday - Friday. Please note that voicemails left after 4:00 p.m. may not be returned until the following business day.  We are closed weekends and major holidays. You have access to a nurse at all times for urgent questions. Please call the main number to the clinic [phone] and follow the prompts.  For any non-urgent questions, you may also contact your provider using MyChart. We now offer e-Visits for anyone 18 and older to request care online for non-urgent symptoms. For details visit [email or web address].   Also download the MyChart app! Go to the app store, search "MyChart", open the app, select [facility], and log in with your MyChart username and password.  Due to Covid, a mask is required upon entering the hospital/clinic. If you do not have a mask, one will be given to you upon arrival. For doctor visits, patients may have 1 support person aged 18 or older with them. For treatment visits, patients cannot have anyone with them due to current Covid guidelines and our immunocompromised population.  

## 2020-11-17 NOTE — Progress Notes (Signed)
Patient presents today for IV fluids.  Patient complains of constipation and rash on L forearm.  Patient advised to start stool softeners for constipation.  We will re-evaluate constipation tomorrow.  Tarri Abernethy, PA-C notified of rash and she will evaluate it.  Patient is in satisfactory condition and vital signs are stable.  Patient was accessed upon arrival.  He was de-accessed.  Port flushed without difficulty and good blood return was noted.  He was re-accessed for IVF.  Port flushed without difficulty and good blood return was noted.  Patient remains accessed for IVF.   Patient tolerated IVF well with no complaints voiced.  Patient left ambulatory in stable condition.  Vital signs stable at discharge.  Follow up as scheduled.

## 2020-11-17 NOTE — Progress Notes (Signed)
Miguel Colon, Miguel Colon [id number]   CLINIC:  Medical Oncology/Hematology  PCP:  Miguel Richard, MD [address] [place] / [place] [address] [phone]   REASON FOR VISIT:  Follow-up for small cell lung cancer  PRIOR THERAPY: none  NGS Results: not done  CURRENT THERAPY: Cisplatin D1 + Etoposide D1-3 q21d  BRIEF ONCOLOGIC HISTORY:  Oncology History  Small cell lung cancer, left ([place])  08/04/2020 Imaging   No evidence of thoracic aortic aneurysm or dissection.   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   No findings specific for metastatic disease.   Aortic Atherosclerosis (ICD10-I70.0) and Emphysema (ICD10-J43.9).   09/02/2020 Imaging   MRI brain IMPRESSION: 1. No acute intracranial abnormality. 2. Mild chronic small vessel ischemic disease.   09/04/2020 Imaging   CT abdomen and pelvis 1. Left lung base nodule, likely metastatic. 2. No metastatic disease demonstrated in the abdomen or pelvis. 3. Diffusely stool-filled colon with prominent stool filled rectum, likely constipation.   09/05/2020 Pathology Results   FINAL MICROSCOPIC DIAGNOSIS:   A. LUNG, LUL, BIOPSY:  - Poorly differentiated carcinoma.  - See comment.   COMMENT:   The findings favor poorly differentiated non-small cell carcinoma.  Immunohistochemistry will be performed and reported as an addendum.   ADDENDUM:   By immunohistochemistry, the neoplastic cells are positive for TTF-1, synaptophysin and chromogranin (dot-like positivity) but negative for cytokeratin 5/6, cytokeratin 7, CD56, p40, and p63.  Overall the  morphology and immunophenotype, are consistent with a small cell carcinoma.       09/17/2020 Initial Diagnosis   Small cell lung cancer, left ([place])   09/17/2020 Cancer Staging   Staging form: Lung, AJCC 8th Edition - Clinical stage from 09/17/2020: Stage IIB (cT3, cN0, cM0) - Signed by Miguel Lark, MD on 09/17/2020 Stage prefix: Initial diagnosis   10/07/2020 -  Chemotherapy    Patient is on Treatment Plan: LUNG SMALL CELL CISPLATIN D1 + ETOPOSIDE D1-3 Q21D         CANCER STAGING: Cancer Staging Small cell lung cancer, left (H[facility]) Staging form: Lung, AJCC 8th Edition - Clinical stage from 09/17/2020: Stage IIB (cT3, cN0, cM0) - Signed by Miguel Lark, MD on 09/17/2020   INTERVAL HISTORY:  [name], a 65 y.o. male, returns for routine follow-up and consideration for next cycle of chemotherapy. Miguel Colon was last seen on 10/28/2020.  Due for cycle #3 of Cisplatin + Etoposide today.   Overall, he tells me he has been feeling pretty well. His left shoulder pain has resolved and he no longer requires pain medication. He reports blisters and rash on his left hand and arm following yard work and contact with poison oak. His last BM was 5 days ago. He has been taking Colace daily, and he started taking lactulose this morning. He reports drinking 1 gallon of fluids daily with 40 ounces being water. He has 3 meals accompanied by 3 Ensures daily. He reports intermittent tingling numbness in his fingertips about 3 times a day which has not affected his strength or fine motor skills; he denies tingling or numbness in his feet. He denies tinnitus. He reports occasional nausea, and he denies diarrhea and vomiting.   Overall, he is not ready for next cycle of chemo today.   REVIEW OF SYSTEMS:  Review of Systems  Constitutional:  Negative for appetite change and fatigue (75%).  HENT:   Negative for tinnitus.  Gastrointestinal:  Positive for nausea. Negative for diarrhea.  Skin:  Positive for rash.  Neurological:  Positive for numbness (tingling fingertips).  All other systems reviewed and are negative.  PAST MEDICAL/SURGICAL HISTORY:  Past Medical History:  Diagnosis Date   Asthma    Back pain    Lung cancer ([place])    Migraines    Thyroid disease    Past Surgical History:   Procedure Laterality Date   IR IMAGING GUIDED PORT INSERTION  09/30/2020   THYROIDECTOMY      SOCIAL HISTORY:  Social History   Socioeconomic History   Marital status: Legally Separated    Spouse name: Not on file   Number of children: 3   Years of education: Not on file   Highest education level: Not on file  Occupational History   Occupation: Retired  Tobacco Use   Smoking status: Former    Packs/day: 0.50    Years: 47.00    Pack years: 23.50    Types: Cigarettes    Quit date: 09/02/2020    Years since quitting: 0.2   Smokeless tobacco: Never  Vaping Use   Vaping Use: Never used  Substance and Sexual Activity   Alcohol use: No   Drug use: No   Sexual activity: Not Currently  Other Topics Concern   Not on file  Social History Narrative   Not on file   Social Determinants of Health   Financial Resource Strain: Medium Risk   Difficulty of Paying Living Expenses: Somewhat hard  Food Insecurity: No Food Insecurity   Worried About Charity fundraiser in the Last Year: Never true   [place] in the Last Year: Never true  Transportation Needs: Unmet Transportation Needs   Lack of Transportation (Medical): Yes   Lack of Transportation (Non-Medical): Yes  Physical Activity: Insufficiently Active   Days of Exercise per Week: 5 days   Minutes of Exercise per Session: 20 min  Stress: No Stress Concern Present   Feeling of Stress : Not at all  Social Connections: Socially Isolated   Frequency of Communication with Friends and Family: Three times a week   Frequency of Social Gatherings with Friends and Family: Three times a week   Attends Religious Services: Never   Active Member of Clubs or Organizations: No   Attends Archivist Meetings: Never   Marital Status: Separated  Intimate Partner Violence: Not At Risk   Fear of Current or Ex-Partner: No   Emotionally Abused: No   Physically Abused: No   Sexually Abused: No    FAMILY HISTORY:  Family  History  Problem Relation Age of Onset   Hypertension Mother    Hypertension Father    Colon cancer Father 41    CURRENT MEDICATIONS:  Current Outpatient Medications  Medication Sig Dispense Refill   acetaminophen (TYLENOL) 500 MG tablet Take 2 tablets (1,000 mg total) by mouth 3 (three) times daily. 30 tablet 0   CISPLATIN IV Inject into the vein every 21 ( twenty-one) days.     Ensure (ENSURE) Take 237 mLs by mouth daily.     ETOPOSIDE IV Inject into the vein every 21 ( twenty-one) days. Days 1-3 every 21 days     gabapentin (NEURONTIN) 300 MG capsule Take 1 capsule (300 mg total) by mouth 3 (three) times daily. 90 capsule 3   Lactulose 20 GM/30ML SOLN Take 30 mLs (20 g total) by mouth at bedtime. Take 30 ml by mouth every 3  hours until you have a bowel movement, then take 30 ml by mouth daily at bedtime. 473 mL 2   levothyroxine (SYNTHROID) 125 MCG tablet Take 2 tablets (250 mcg total) by mouth daily before breakfast. 60 tablet 3   lidocaine (LIDODERM) 5 % Place 1 patch onto the skin daily. Remove & Discard patch within 12 hours or as directed by MD 30 patch 0   lidocaine-prilocaine (EMLA) cream Apply to affected area once 30 g 3   methadone (DOLOPHINE) 10 MG tablet Take 1 tablet (10 mg total) by mouth every 8 (eight) hours. 90 tablet 0   naloxegol oxalate (MOVANTIK) 25 MG TABS tablet Take 1 tablet (25 mg total) by mouth daily. 30 tablet 3   nicotine (NICODERM CQ - DOSED IN MG/24 HOURS) 14 mg/24hr patch Place 1 patch (14 mg total) onto the skin daily. 28 patch 0   ondansetron (ZOFRAN) 8 MG tablet Take 1 tablet (8 mg total) by mouth 2 (two) times daily as needed. Start on the third day after cisplatin chemotherapy. 30 tablet 1   polyethylene glycol (MIRALAX) 17 g packet Take 17 g by mouth daily. 30 each 2   prochlorperazine (COMPAZINE) 10 MG tablet Take 1 tablet (10 mg total) by mouth every 6 (six) hours as needed (Nausea or vomiting). 30 tablet 1   senna (SENOKOT) 8.6 MG TABS tablet Take  2 tablets (17.2 mg total) by mouth 2 (two) times daily. 120 tablet 0   sucralfate (CARAFATE) 1 g tablet Take 1 tablet (1 g total) by mouth 4 (four) times daily -  with meals and at bedtime. Crush and dissolve in 10 mL of warm water prior to swallowing 120 tablet 1   traZODone (DESYREL) 50 MG tablet Take 1 tablet (50 mg total) by mouth at bedtime. 30 tablet 3   No current facility-administered medications for this visit.   Facility-Administered Medications Ordered in Other Visits  Medication Dose Route Frequency Provider Last Rate Last Admin   etoposide (VEPESID) 120 mg in sodium chloride 0.9 % 500 mL chemo infusion  70 mg/m2 (Treatment Plan Recorded) Intravenous Once Derek Jack, MD        ALLERGIES:  No Known Allergies  PHYSICAL EXAM:  Performance status (ECOG): 1 - Symptomatic but completely ambulatory  There were no vitals filed for this visit. Wt Readings from Last 3 Encounters:  11/17/20 142 lb 6.4 oz (64.6 kg)  11/10/20 143 lb 8 oz (65.1 kg)  11/05/20 140 lb 9.6 oz (63.8 kg)   Physical Exam Vitals reviewed.  Constitutional:      Appearance: Normal appearance.  Cardiovascular:     Rate and Rhythm: Normal rate and regular rhythm.     Pulses: Normal pulses.     Heart sounds: Normal heart sounds.  Pulmonary:     Effort: Pulmonary effort is normal.     Breath sounds: Normal breath sounds.  Neurological:     General: No focal deficit present.     Mental Status: He is alert and oriented to person, place, and time.  Psychiatric:        Mood and Affect: Mood normal.        Behavior: Behavior normal.    LABORATORY DATA:  I have reviewed the labs as listed.  CBC Latest Ref Rng & Units 11/17/2020 10/28/2020 10/27/2020  WBC 4.0 - 10.5 K/uL 1.9(L) 4.5 2.3(L)  Hemoglobin 13.0 - 17.0 g/dL 9.1(L) 10.8(L) 10.6(L)  Hematocrit 39.0 - 52.0 % 27.7(L) 32.8(L) 32.0(L)  Platelets 150 - 400  K/uL 134(L) 302 285   CMP Latest Ref Rng & Units 11/17/2020 11/10/2020 11/05/2020  Glucose 70  - 99 mg/dL 92 85 92  BUN 8 - 23 mg/dL 22 26(H) 46(H)  Creatinine 0.61 - 1.24 mg/dL 1.96(H) 2.02(H) 2.45(H)  Sodium 135 - 145 mmol/L 138 138 138  Potassium 3.5 - 5.1 mmol/L 3.9 4.1 4.3  Chloride 98 - 111 mmol/L 103 103 99  CO2 22 - 32 mmol/L 31 29 30   Calcium 8.9 - 10.3 mg/dL 7.1(L) 7.6(L) 7.5(L)  Total Protein 6.5 - 8.1 g/dL 7.0 - -  Total Bilirubin 0.3 - 1.2 mg/dL 0.3 - -  Alkaline Phos 38 - 126 U/L 34(L) - -  AST 15 - 41 U/L 14(L) - -  ALT 0 - 44 U/L 8 - -    DIAGNOSTIC IMAGING:  I have independently reviewed the scans and discussed with the patient. No results found.   ASSESSMENT:  1.  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  IHC shows positivity for TTF-1, synaptophysin and chromogranin (dot-like positivity), consistent with small cell lung cancer. - MRI of the brain without contrast on 09/02/2020-no metastasis. - PET scan on 10/02/2020 with 8 cm left apical lung mass, hypermetabolic, highly suspicious for Pancoast tumor with chest wall invasion.  No findings suggestive of mediastinal/hilar adenopathy or other metastatic disease. - 15 pound weight loss in the last 2 months. - MR C-spine on 09/02/2020 with the left apical lung mass likely involving the left brachial plexus.  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  Severe bilateral neural foraminal stenosis at C3-C4. - Cycle 1 of cisplatin and etoposide on 10/07/2020. - XRT started on 10/23/2020.  2.  Social/family history: - Quit smoking on 08/05/2020.  He was a Administrator. - He lives by himself. - Paternal aunt died of cancer 26 years ago.  Type unknown.   PLAN:  1.  Stage IIIa (T4 N0 M0) small cell lung cancer: - He has completed 2 cycles of cisplatin and etoposide. - He did not experience any ringing in the ears or hearing loss.  Tingling and  numbness on and off in the fingertips present.  Its not interfering with day-to-day activities. - Reviewed labs from 11/17/2020.  White count is low at 1.9.  Absolute neutrophil count is 0.7.  Platelets are 134. - He had poison oak exposure with blistering erythematous rash on the left forearm which is improving. - His creatinine has been staying around 2.0.  I do not believe he will be able to tolerate cisplatin anymore. - I will switch his treatment to carboplatin and etoposide.  We will hold his treatment today.  We will give him G-CSF and reassess his CBC tomorrow.  He will receive treatment tomorrow if ANC is more than 1.5.  RTC 3 weeks for follow-up.  2.  Left shoulder and arm pain: - This has completely improved in the last few days. - He is not requiring methadone.  3.  Constipation: - Discontinue Movantik as he is not on narcotics. - Continue Colace.  Take lactulose every 3 hours until bowel movement as he had last bowel movement last Thursday.  After bowel movement, he will take maintenance lactulose 1-2 times per day.  4.  Weight loss: - Continue Ensure 3 cans daily after eating food. - He gained about 3 pounds.  5.  Elevated creatinine: - Creatinine today remains elevated at 1.90.  This is despite giving him aggressive hydration.  He reports drinking about 2 to 3 L daily. - He will receive 1 L normal saline today.  6.  Hypothyroidism: - TSH was 96 on 09/03/2020. - He reports taking Synthroid 125 mcg daily. - TSH today is 55.  Will increase Synthroid to 250 mcg daily.   Orders placed this encounter:  No orders of the defined types were placed in this encounter.    Derek Jack, MD [facility] [phone]   I, Thana Ates, am acting as a scribe for Dr. Derek Jack.  I, Derek Jack MD, have reviewed the above documentation for accuracy and completeness, and I agree with the above.

## 2020-11-18 ENCOUNTER — Ambulatory Visit (HOSPITAL_COMMUNITY): Payer: Medicare Other

## 2020-11-18 ENCOUNTER — Inpatient Hospital Stay (HOSPITAL_BASED_OUTPATIENT_CLINIC_OR_DEPARTMENT_OTHER): Payer: Medicare Other | Admitting: Hematology

## 2020-11-18 ENCOUNTER — Other Ambulatory Visit (HOSPITAL_COMMUNITY): Payer: Medicare Other

## 2020-11-18 ENCOUNTER — Ambulatory Visit
Admission: RE | Admit: 2020-11-18 | Discharge: 2020-11-18 | Disposition: A | Discharge status: Home / Self Care | Payer: Medicare Other | Source: Ambulatory Visit | Attending: Radiation Oncology | Admitting: Radiation Oncology

## 2020-11-18 ENCOUNTER — Inpatient Hospital Stay (HOSPITAL_COMMUNITY): Payer: Medicare Other

## 2020-11-18 ENCOUNTER — Encounter: Payer: Medicare Other | Admitting: Dietician

## 2020-11-18 ENCOUNTER — Ambulatory Visit (HOSPITAL_COMMUNITY): Payer: Medicare Other | Admitting: Hematology

## 2020-11-18 VITALS — BP 126/85 | HR 68 | Temp 96.9°F | Resp 18 | Wt 141.1 lb

## 2020-11-18 DIAGNOSIS — Z5111 Encounter for antineoplastic chemotherapy: Secondary | ICD-10-CM | POA: Diagnosis not present

## 2020-11-18 DIAGNOSIS — C3490 Malignant neoplasm of unspecified part of unspecified bronchus or lung: Secondary | ICD-10-CM | POA: Diagnosis not present

## 2020-11-18 DIAGNOSIS — D702 Other drug-induced agranulocytosis: Secondary | ICD-10-CM

## 2020-11-18 DIAGNOSIS — C3492 Malignant neoplasm of unspecified part of left bronchus or lung: Secondary | ICD-10-CM

## 2020-11-18 DIAGNOSIS — R64 Cachexia: Secondary | ICD-10-CM

## 2020-11-18 DIAGNOSIS — E039 Hypothyroidism, unspecified: Secondary | ICD-10-CM

## 2020-11-18 LAB — BASIC METABOLIC PANEL
Anion gap: 5 (ref 5–15)
BUN: 20 mg/dL (ref 8–23)
CO2: 30 mmol/L (ref 22–32)
Calcium: 7 mg/dL — ABNORMAL LOW (ref 8.9–10.3)
Chloride: 102 mmol/L (ref 98–111)
Creatinine, Ser: 1.9 mg/dL — ABNORMAL HIGH (ref 0.61–1.24)
GFR, Estimated: 39 mL/min — ABNORMAL LOW (ref >60)
Glucose, Bld: 86 mg/dL (ref 70–99)
Potassium: 3.9 mmol/L (ref 3.5–5.1)
Sodium: 137 mmol/L (ref 135–145)

## 2020-11-18 LAB — TSH: TSH: 55.526 u[IU]/mL — ABNORMAL HIGH (ref 0.350–4.500)

## 2020-11-18 MED ORDER — FILGRASTIM-SNDZ 480 MCG/0.8ML IJ SOSY
480.0000 ug | PREFILLED_SYRINGE | Freq: Once | INTRAMUSCULAR | Status: AC
Start: 2020-11-18 — End: 2020-11-18
  Administered 2020-11-18: 480 ug via SUBCUTANEOUS
  Filled 2020-11-18: qty 0.8

## 2020-11-18 MED ORDER — SODIUM CHLORIDE 0.9% FLUSH
10.0000 mL | Freq: Once | INTRAVENOUS | Status: AC
  Administered 2020-11-18: 10 mL via INTRAVENOUS

## 2020-11-18 MED ORDER — SODIUM CHLORIDE 0.9 % IV SOLN: Freq: Once | INTRAVENOUS | Status: AC

## 2020-11-18 MED ORDER — HEPARIN SOD (PORK) LOCK FLUSH 100 UNIT/ML IV SOLN
500.0000 [IU] | Freq: Once | INTRAVENOUS | Status: AC
  Administered 2020-11-18: 500 [IU] via INTRAVENOUS

## 2020-11-18 NOTE — Patient Instructions (Addendum)
[place] at [facility] Discharge Instructions  You were seen today by Dr. Delton Coombes. He went over your recent results. You will return to the clinic tomorrow for your treatment. Dr. Delton Coombes will see you back in 3 weeks for labs and follow up.   Thank you for choosing [place] at [facility] to provide your oncology and hematology care.  To afford each patient quality time with our provider, please arrive at least 15 minutes before your scheduled appointment time.   If you have a lab appointment with the [facility] please come in thru the Main Entrance and check in at the main information desk  You need to re-schedule your appointment should you arrive 10 or more minutes late.  We strive to give you quality time with our providers, and arriving late affects you and other patients whose appointments are after yours.  Also, if you no show three or more times for appointments you may be dismissed from the clinic at the providers discretion.     Again, thank you for choosing [facility].  Our hope is that these requests will decrease the amount of time that you wait before being seen by our physicians.       _____________________________________________________________  Should you have questions after your visit to [facility], please contact our office at [phone] between the hours of 8:00 a.m. and 4:30 p.m.  Voicemails left after 4:00 p.m. will not be returned until the following business day.  For prescription refill requests, have your pharmacy contact our office and allow 72 hours.    Cancer Center Support Programs:   > Cancer Support Group  2nd Tuesday of the month 1pm-2pm, Journey Room

## 2020-11-18 NOTE — Progress Notes (Signed)
Chaplain engaged in a follow-up visit with Miguel Colon who expressed the need to take care of his mental health.  He talked about going fishing recently and how much he needed that.  Miguel Colon expressed how chemo has been impacting kidneys and how he recognizes that it is helping one thing and possibly destroying another.  He voiced that he is putting "poison" into his body.  That has been hard for him to reconcile.  He has also been thinking deeply about how much longer he will be taking infusions.  It makes him feel like he won't have his life back until after a certain time.  Chaplain affirmed [name]' need to take care of himself mentally as all these things are happening to his body.  Miguel Colon is committed to showing up for himself and not giving up on life.  He is finding enjoyment in food, fishing, and going places.   Chaplain offered listening, presence, and support.  Chaplain will continue to follow-up.    11/18/20 1100  Clinical Encounter Type  Visited With Patient  Visit Type Follow-up;Spiritual support;Social support

## 2020-11-18 NOTE — Patient Instructions (Addendum)
[facility]  Discharge Instructions: Thank you for choosing [facility] to provide your oncology and hematology care.  If you have a lab appointment with the [facility], please come in thru the Main Entrance and check in at the main information desk.  Wear comfortable clothing and clothing appropriate for easy access to any Portacath or PICC line.   We strive to give you quality time with your provider. You may need to reschedule your appointment if you arrive late (15 or more minutes).  Arriving late affects you and other patients whose appointments are after yours.  Also, if you miss three or more appointments without notifying the office, you may be dismissed from the clinic at the provider's discretion.      For prescription refill requests, have your pharmacy contact our office and allow 72 hours for refills to be completed.    Today you received the following chemotherapy and/or immunotherapy agents Fluids and  Zarxio injection    To help prevent nausea and vomiting after your treatment, we encourage you to take your nausea medication as directed.  BELOW ARE SYMPTOMS THAT SHOULD BE REPORTED IMMEDIATELY: *FEVER GREATER THAN 100.4 F (38 C) OR HIGHER *CHILLS OR SWEATING *NAUSEA AND VOMITING THAT IS NOT CONTROLLED WITH YOUR NAUSEA MEDICATION *UNUSUAL SHORTNESS OF BREATH *UNUSUAL BRUISING OR BLEEDING *URINARY PROBLEMS (pain or burning when urinating, or frequent urination) *BOWEL PROBLEMS (unusual diarrhea, constipation, pain near the anus) TENDERNESS IN MOUTH AND THROAT WITH OR WITHOUT PRESENCE OF ULCERS (sore throat, sores in mouth, or a toothache) UNUSUAL RASH, SWELLING OR PAIN  UNUSUAL VAGINAL DISCHARGE OR ITCHING   Items with * indicate a potential emergency and should be followed up as soon as possible or go to the Emergency Department if any problems should occur.  Please show the CHEMOTHERAPY ALERT CARD or IMMUNOTHERAPY ALERT CARD at check-in to the  Emergency Department and triage nurse.  Should you have questions after your visit or need to cancel or reschedule your appointment, please contact [facility] [phone]  and follow the prompts.  Office hours are 8:00 a.m. to 4:30 p.m. Monday - Friday. Please note that voicemails left after 4:00 p.m. may not be returned until the following business day.  We are closed weekends and major holidays. You have access to a nurse at all times for urgent questions. Please call the main number to the clinic [phone] and follow the prompts.  For any non-urgent questions, you may also contact your provider using MyChart. We now offer e-Visits for anyone 15 and older to request care online for non-urgent symptoms. For details visit [email or web address].   Also download the MyChart app! Go to the app store, search "MyChart", open the app, select [facility], and log in with your MyChart username and password.  Due to Covid, a mask is required upon entering the hospital/clinic. If you do not have a mask, one will be given to you upon arrival. For doctor visits, patients may have 1 support person aged 3 or older with them. For treatment visits, patients cannot have anyone with them due to current Covid guidelines and our immunocompromised population.

## 2020-11-18 NOTE — Progress Notes (Signed)
Patient not being treated today per Dr. Delton Coombes.  Patient is getting one liter normal saline over two hours and Zarxio injection.  Patient will return tomorrow and have repeat labs and treatment.  Zarxio administration without incident; injection site WNL; see MAR for injection details.  Patient tolerated procedure well and without incident.  No questions or complaints noted at this time.    Stable during infusion and injection.  Discharge from clinic ambulatory in stable condition.  Alert and oriented X 3.  Follow up with [facility] as scheduled.

## 2020-11-19 ENCOUNTER — Ambulatory Visit
Admission: RE | Admit: 2020-11-19 | Discharge: 2020-11-19 | Disposition: A | Discharge status: Home / Self Care | Payer: Medicare Other | Source: Ambulatory Visit | Attending: Radiation Oncology | Admitting: Radiation Oncology

## 2020-11-19 ENCOUNTER — Inpatient Hospital Stay (HOSPITAL_COMMUNITY): Payer: Medicare Other

## 2020-11-19 ENCOUNTER — Ambulatory Visit (HOSPITAL_COMMUNITY): Payer: Medicare Other

## 2020-11-19 ENCOUNTER — Other Ambulatory Visit: Payer: Self-pay

## 2020-11-19 VITALS — BP 116/79 | HR 61 | Temp 97.0°F | Resp 18

## 2020-11-19 DIAGNOSIS — C3492 Malignant neoplasm of unspecified part of left bronchus or lung: Secondary | ICD-10-CM | POA: Diagnosis not present

## 2020-11-19 DIAGNOSIS — Z5111 Encounter for antineoplastic chemotherapy: Secondary | ICD-10-CM | POA: Diagnosis not present

## 2020-11-19 DIAGNOSIS — C3412 Malignant neoplasm of upper lobe, left bronchus or lung: Secondary | ICD-10-CM

## 2020-11-19 DIAGNOSIS — C3490 Malignant neoplasm of unspecified part of unspecified bronchus or lung: Secondary | ICD-10-CM

## 2020-11-19 LAB — COMPREHENSIVE METABOLIC PANEL
ALT: 9 U/L (ref 0–44)
AST: 17 U/L (ref 15–41)
Albumin: 3.2 g/dL — ABNORMAL LOW (ref 3.5–5.0)
Alkaline Phosphatase: 34 U/L — ABNORMAL LOW (ref 38–126)
Anion gap: 7 (ref 5–15)
BUN: 19 mg/dL (ref 8–23)
CO2: 29 mmol/L (ref 22–32)
Calcium: 7.2 mg/dL — ABNORMAL LOW (ref 8.9–10.3)
Chloride: 101 mmol/L (ref 98–111)
Creatinine, Ser: 1.86 mg/dL — ABNORMAL HIGH (ref 0.61–1.24)
GFR, Estimated: 40 mL/min — ABNORMAL LOW (ref >60)
Glucose, Bld: 142 mg/dL — ABNORMAL HIGH (ref 70–99)
Potassium: 3.6 mmol/L (ref 3.5–5.1)
Sodium: 137 mmol/L (ref 135–145)
Total Bilirubin: 0.4 mg/dL (ref 0.3–1.2)
Total Protein: 7.1 g/dL (ref 6.5–8.1)

## 2020-11-19 LAB — CBC WITH DIFFERENTIAL/PLATELET
Band Neutrophils: 1 %
Basophils Absolute: 0 10*3/uL (ref 0.0–0.1)
Basophils Relative: 0 %
Eosinophils Absolute: 0.1 10*3/uL (ref 0.0–0.5)
Eosinophils Relative: 1 %
HCT: 28.3 % — ABNORMAL LOW (ref 39.0–52.0)
Hemoglobin: 9.4 g/dL — ABNORMAL LOW (ref 13.0–17.0)
Lymphocytes Relative: 18 %
Lymphs Abs: 1.2 10*3/uL (ref 0.7–4.0)
MCH: 33.2 pg (ref 26.0–34.0)
MCHC: 33.2 g/dL (ref 30.0–36.0)
MCV: 100 fL (ref 80.0–100.0)
Metamyelocytes Relative: 1 %
Monocytes Absolute: 0.7 10*3/uL (ref 0.1–1.0)
Monocytes Relative: 10 %
Neutro Abs: 4.6 10*3/uL (ref 1.7–7.7)
Neutrophils Relative %: 69 %
Platelets: 129 10*3/uL — ABNORMAL LOW (ref 150–400)
RBC: 2.83 MIL/uL — ABNORMAL LOW (ref 4.22–5.81)
RDW: 15.7 % — ABNORMAL HIGH (ref 11.5–15.5)
WBC: 6.5 10*3/uL (ref 4.0–10.5)
nRBC: 0 % (ref 0.0–0.2)

## 2020-11-19 LAB — MAGNESIUM: Magnesium: 1.8 mg/dL (ref 1.7–2.4)

## 2020-11-19 MED ORDER — SODIUM CHLORIDE 0.9 % IV SOLN
150.0000 mg | Freq: Once | INTRAVENOUS | Status: AC
  Administered 2020-11-19: 150 mg via INTRAVENOUS
  Filled 2020-11-19: qty 150

## 2020-11-19 MED ORDER — SODIUM CHLORIDE 0.9 % IV SOLN: Freq: Once | INTRAVENOUS | Status: AC

## 2020-11-19 MED ORDER — HEPARIN SOD (PORK) LOCK FLUSH 100 UNIT/ML IV SOLN
500.0000 [IU] | Freq: Once | INTRAVENOUS | Status: AC | PRN
  Administered 2020-11-19: 500 [IU]

## 2020-11-19 MED ORDER — PALONOSETRON HCL INJECTION 0.25 MG/5ML
0.2500 mg | Freq: Once | INTRAVENOUS | Status: AC
  Administered 2020-11-19: 0.25 mg via INTRAVENOUS
  Filled 2020-11-19: qty 5

## 2020-11-19 MED ORDER — SODIUM CHLORIDE 0.9% FLUSH
10.0000 mL | INTRAVENOUS | Status: DC | PRN
  Administered 2020-11-19: 10 mL

## 2020-11-19 MED ORDER — SODIUM CHLORIDE 0.9 % IV SOLN
10.0000 mg | Freq: Once | INTRAVENOUS | Status: AC
  Administered 2020-11-19: 10 mg via INTRAVENOUS
  Filled 2020-11-19: qty 10

## 2020-11-19 MED ORDER — ETOPOSIDE CHEMO INJECTION 1 GM/50ML
75.0000 mg/m2 | Freq: Once | INTRAVENOUS | Status: AC
  Administered 2020-11-19: 130 mg via INTRAVENOUS
  Filled 2020-11-19: qty 6.5

## 2020-11-19 MED ORDER — SODIUM CHLORIDE 0.9 % IV SOLN
310.0000 mg | Freq: Once | INTRAVENOUS | Status: AC
  Administered 2020-11-19: 310 mg via INTRAVENOUS
  Filled 2020-11-19: qty 31

## 2020-11-19 MED ORDER — SODIUM CHLORIDE 0.9 % IV SOLN: INTRAVENOUS | Status: DC

## 2020-11-19 NOTE — Progress Notes (Signed)
ANC 4.6, creatinine 1.86.  Dr Raliegh Ip aware.  Okay for treatment today per Dr Raliegh Ip.

## 2020-11-19 NOTE — Patient Instructions (Signed)
[facility]  Discharge Instructions: Thank you for choosing [facility] to provide your oncology and hematology care.  If you have a lab appointment with the [facility], please come in thru the Main Entrance and check in at the main information desk.  Wear comfortable clothing and clothing appropriate for easy access to any Portacath or PICC line.   We strive to give you quality time with your provider. You may need to reschedule your appointment if you arrive late (15 or more minutes).  Arriving late affects you and other patients whose appointments are after yours.  Also, if you miss three or more appointments without notifying the office, you may be dismissed from the clinic at the provider's discretion.      For prescription refill requests, have your pharmacy contact our office and allow 72 hours for refills to be completed.    Today you received the following chemotherapy and/or immunotherapy agents Carbo/VP-16.   To help prevent nausea and vomiting after your treatment, we encourage you to take your nausea medication as directed.  BELOW ARE SYMPTOMS THAT SHOULD BE REPORTED IMMEDIATELY: *FEVER GREATER THAN 100.4 F (38 C) OR HIGHER *CHILLS OR SWEATING *NAUSEA AND VOMITING THAT IS NOT CONTROLLED WITH YOUR NAUSEA MEDICATION *UNUSUAL SHORTNESS OF BREATH *UNUSUAL BRUISING OR BLEEDING *URINARY PROBLEMS (pain or burning when urinating, or frequent urination) *BOWEL PROBLEMS (unusual diarrhea, constipation, pain near the anus) TENDERNESS IN MOUTH AND THROAT WITH OR WITHOUT PRESENCE OF ULCERS (sore throat, sores in mouth, or a toothache) UNUSUAL RASH, SWELLING OR PAIN  UNUSUAL VAGINAL DISCHARGE OR ITCHING   Items with * indicate a potential emergency and should be followed up as soon as possible or go to the Emergency Department if any problems should occur.  Please show the CHEMOTHERAPY ALERT CARD or IMMUNOTHERAPY ALERT CARD at check-in to the Emergency  Department and triage nurse.  Should you have questions after your visit or need to cancel or reschedule your appointment, please contact [facility] [phone]  and follow the prompts.  Office hours are 8:00 a.m. to 4:30 p.m. Monday - Friday. Please note that voicemails left after 4:00 p.m. may not be returned until the following business day.  We are closed weekends and major holidays. You have access to a nurse at all times for urgent questions. Please call the main number to the clinic [phone] and follow the prompts.  For any non-urgent questions, you may also contact your provider using MyChart. We now offer e-Visits for anyone 2 and older to request care online for non-urgent symptoms. For details visit [email or web address].   Also download the MyChart app! Go to the app store, search "MyChart", open the app, select [place], and log in with your MyChart username and password.  Due to Covid, a mask is required upon entering the hospital/clinic. If you do not have a mask, one will be given to you upon arrival. For doctor visits, patients may have 1 support person aged 6 or older with them. For treatment visits, patients cannot have anyone with them due to current Covid guidelines and our immunocompromised population.

## 2020-11-19 NOTE — Progress Notes (Signed)
Pt presents today for Carbo/VP-16 and 1 liter of NS per provider's order. Vital signs and labs within parameters for treatment.  Pt was reinforced to set up transportation for new schedule of appointments.  Carbo/VP-16 and 1 liter of NS given today per MD orders. Tolerated infusion without adverse affects. Vital signs stable. No complaints at this time. Discharged from clinic ambulatory in stable condition. Alert and oriented x 3. F/U with [facility] as scheduled.

## 2020-11-19 NOTE — Progress Notes (Signed)
Patient to receive NS 1000 ml x 1 liter over 2 hours with each treatment day due to decreased fluid intake and elevated creatinine.  Reduce etoposide dose to 75 mg/m2 due to decreased renal clearance.    Plan updated.  V.O.  Dr Rhys Martini, PharmD

## 2020-11-20 ENCOUNTER — Ambulatory Visit (HOSPITAL_COMMUNITY): Payer: Medicare Other

## 2020-11-20 ENCOUNTER — Ambulatory Visit: Payer: Medicare Other

## 2020-11-20 ENCOUNTER — Telehealth (HOSPITAL_COMMUNITY): Payer: Self-pay

## 2020-11-20 ENCOUNTER — Ambulatory Visit (HOSPITAL_COMMUNITY): Payer: Medicare Other | Admitting: Dietician

## 2020-11-20 MED FILL — Dexamethasone Sodium Phosphate Inj 100 MG/10ML: INTRAMUSCULAR | Qty: 1 | Status: AC

## 2020-11-20 NOTE — Telephone Encounter (Signed)
1130-Called patient to see if he is coming today. Patient stated he was running a 100 degree fever. Had been nauseated and vomiting off and on since yesterday . Patient took 3 doses of lactulose per MD order and had good results. Patient stated his bowels have slowed down today. He states his has nothing coming up now but dry heaving.   MD notified, inform pt to go to the ED if needed and then come in tomorrow for his scheduled appointments.   Called pt back-1200 Informed his that he needs to be taking his prescribed meds for nausea. Per Dr. Delton Coombes he needs to go tho the ED today for symptom management. Patient stated he is just going to stay home today. Nurse informed him that if his fever or symptoms worsen that he should go to the ED. Patient states he understood. Informed patient how important it is to come tomorrow for his scheduled appt. Patient understood.

## 2020-11-20 NOTE — Progress Notes (Signed)
Nutrition  Patient did not show for infusion today. Unable to complete nutrition follow-up. Per notes, patient running fever with nausea and vomiting. He was advised to present to ED for symptom management. Will plan to follow-up with patient via telephone next week as able.

## 2020-11-21 ENCOUNTER — Other Ambulatory Visit: Payer: Self-pay

## 2020-11-21 ENCOUNTER — Ambulatory Visit
Admission: RE | Admit: 2020-11-21 | Discharge: 2020-11-21 | Disposition: A | Discharge status: Home / Self Care | Payer: Medicare Other | Source: Ambulatory Visit | Attending: Radiation Oncology | Admitting: Radiation Oncology

## 2020-11-21 ENCOUNTER — Inpatient Hospital Stay (HOSPITAL_COMMUNITY): Payer: Medicare Other

## 2020-11-21 VITALS — BP 124/72 | HR 60 | Temp 97.3°F | Resp 18

## 2020-11-21 DIAGNOSIS — C3492 Malignant neoplasm of unspecified part of left bronchus or lung: Secondary | ICD-10-CM | POA: Diagnosis not present

## 2020-11-21 DIAGNOSIS — Z5111 Encounter for antineoplastic chemotherapy: Secondary | ICD-10-CM | POA: Diagnosis not present

## 2020-11-21 DIAGNOSIS — C3412 Malignant neoplasm of upper lobe, left bronchus or lung: Secondary | ICD-10-CM

## 2020-11-21 DIAGNOSIS — C3490 Malignant neoplasm of unspecified part of unspecified bronchus or lung: Secondary | ICD-10-CM

## 2020-11-21 LAB — CBC WITH DIFFERENTIAL/PLATELET
Band Neutrophils: 3 %
Basophils Absolute: 0 10*3/uL (ref 0.0–0.1)
Basophils Relative: 0 %
Eosinophils Absolute: 0 10*3/uL (ref 0.0–0.5)
Eosinophils Relative: 0 %
HCT: 27 % — ABNORMAL LOW (ref 39.0–52.0)
Hemoglobin: 8.9 g/dL — ABNORMAL LOW (ref 13.0–17.0)
Lymphocytes Relative: 11 %
Lymphs Abs: 0.8 10*3/uL (ref 0.7–4.0)
MCH: 32.7 pg (ref 26.0–34.0)
MCHC: 33 g/dL (ref 30.0–36.0)
MCV: 99.3 fL (ref 80.0–100.0)
Monocytes Absolute: 0.4 10*3/uL (ref 0.1–1.0)
Monocytes Relative: 5 %
Neutro Abs: 6 10*3/uL (ref 1.7–7.7)
Neutrophils Relative %: 81 %
Platelets: 206 10*3/uL (ref 150–400)
RBC: 2.72 MIL/uL — ABNORMAL LOW (ref 4.22–5.81)
RDW: 16 % — ABNORMAL HIGH (ref 11.5–15.5)
WBC: 7.2 10*3/uL (ref 4.0–10.5)
nRBC: 0 % (ref 0.0–0.2)

## 2020-11-21 LAB — COMPREHENSIVE METABOLIC PANEL
ALT: 9 U/L (ref 0–44)
AST: 16 U/L (ref 15–41)
Albumin: 3.1 g/dL — ABNORMAL LOW (ref 3.5–5.0)
Alkaline Phosphatase: 39 U/L (ref 38–126)
Anion gap: 7 (ref 5–15)
BUN: 25 mg/dL — ABNORMAL HIGH (ref 8–23)
CO2: 29 mmol/L (ref 22–32)
Calcium: 7 mg/dL — ABNORMAL LOW (ref 8.9–10.3)
Chloride: 103 mmol/L (ref 98–111)
Creatinine, Ser: 1.7 mg/dL — ABNORMAL HIGH (ref 0.61–1.24)
GFR, Estimated: 44 mL/min — ABNORMAL LOW (ref >60)
Glucose, Bld: 156 mg/dL — ABNORMAL HIGH (ref 70–99)
Potassium: 3.5 mmol/L (ref 3.5–5.1)
Sodium: 139 mmol/L (ref 135–145)
Total Bilirubin: 0.4 mg/dL (ref 0.3–1.2)
Total Protein: 6.7 g/dL (ref 6.5–8.1)

## 2020-11-21 MED ORDER — SODIUM CHLORIDE 0.9 % IV SOLN
10.0000 mg | Freq: Once | INTRAVENOUS | Status: AC
  Administered 2020-11-21: 10 mg via INTRAVENOUS
  Filled 2020-11-21: qty 1

## 2020-11-21 MED ORDER — SODIUM CHLORIDE 0.9 % IV SOLN: INTRAVENOUS | Status: DC

## 2020-11-21 MED ORDER — HEPARIN SOD (PORK) LOCK FLUSH 100 UNIT/ML IV SOLN
500.0000 [IU] | Freq: Once | INTRAVENOUS | Status: AC | PRN
  Administered 2020-11-21: 500 [IU]

## 2020-11-21 MED ORDER — SODIUM CHLORIDE 0.9% FLUSH
10.0000 mL | INTRAVENOUS | Status: DC | PRN
  Administered 2020-11-21: 10 mL

## 2020-11-21 MED ORDER — SODIUM CHLORIDE 0.9 % IV SOLN: Freq: Once | INTRAVENOUS | Status: AC

## 2020-11-21 MED ORDER — SODIUM CHLORIDE 0.9 % IV SOLN
75.0000 mg/m2 | Freq: Once | INTRAVENOUS | Status: AC
  Administered 2020-11-21: 130 mg via INTRAVENOUS
  Filled 2020-11-21: qty 6.5

## 2020-11-21 NOTE — Patient Instructions (Signed)
[facility]  Discharge Instructions: Thank you for choosing [facility] to provide your oncology and hematology care.  If you have a lab appointment with the Cancer Center, please come in thru the Main Entrance and check in at the main information desk.  Wear comfortable clothing and clothing appropriate for easy access to any Portacath or PICC line.   We strive to give you quality time with your provider. You may need to reschedule your appointment if you arrive late (15 or more minutes).  Arriving late affects you and other patients whose appointments are after yours.  Also, if you miss three or more appointments without notifying the office, you may be dismissed from the clinic at the provider's discretion.      For prescription refill requests, have your pharmacy contact our office and allow 72 hours for refills to be completed.        To help prevent nausea and vomiting after your treatment, we encourage you to take your nausea medication as directed.  BELOW ARE SYMPTOMS THAT SHOULD BE REPORTED IMMEDIATELY: *FEVER GREATER THAN 100.4 F (38 C) OR HIGHER *CHILLS OR SWEATING *NAUSEA AND VOMITING THAT IS NOT CONTROLLED WITH YOUR NAUSEA MEDICATION *UNUSUAL SHORTNESS OF BREATH *UNUSUAL BRUISING OR BLEEDING *URINARY PROBLEMS (pain or burning when urinating, or frequent urination) *BOWEL PROBLEMS (unusual diarrhea, constipation, pain near the anus) TENDERNESS IN MOUTH AND THROAT WITH OR WITHOUT PRESENCE OF ULCERS (sore throat, sores in mouth, or a toothache) UNUSUAL RASH, SWELLING OR PAIN  UNUSUAL VAGINAL DISCHARGE OR ITCHING   Items with * indicate a potential emergency and should be followed up as soon as possible or go to the Emergency Department if any problems should occur.  Please show the CHEMOTHERAPY ALERT CARD or IMMUNOTHERAPY ALERT CARD at check-in to the Emergency Department and triage nurse.  Should you have questions after your visit or need to cancel  or reschedule your appointment, please contact [facility] [phone]  and follow the prompts.  Office hours are 8:00 a.m. to 4:30 p.m. Monday - Friday. Please note that voicemails left after 4:00 p.m. may not be returned until the following business day.  We are closed weekends and major holidays. You have access to a nurse at all times for urgent questions. Please call the main number to the clinic [phone] and follow the prompts.  For any non-urgent questions, you may also contact your provider using MyChart. We now offer e-Visits for anyone 18 and older to request care online for non-urgent symptoms. For details visit [email or web address].   Also download the MyChart app! Go to the app store, search "MyChart", open the app, select [place], and log in with your MyChart username and password.  Due to Covid, a mask is required upon entering the hospital/clinic. If you do not have a mask, one will be given to you upon arrival. For doctor visits, patients may have 1 support person aged 18 or older with them. For treatment visits, patients cannot have anyone with them due to current Covid guidelines and our immunocompromised population.  

## 2020-11-21 NOTE — Progress Notes (Signed)
Patient presents today for chemotherapy infusion.  Patient complains of fever, diarrhea, and nausea yesterday, but symptoms have completely resolved today.  Patient also complains of dysphagia that started yesterday evening.  We will continue to monitor.  Patient encouraged to eat soft foods or foods that are more tolerable.  Vital signs are stable.  Labs drawn on 11/19/2020 were reviewed by Dr. Delton Coombes.  We will proceed with treatment per MD orders.    Patient tolerated treatment well with no complaints voiced.  Patient left ambulatory in stable condition.  Vital signs stable at discharge.  Follow up as scheduled.

## 2020-11-24 ENCOUNTER — Ambulatory Visit: Payer: Medicare Other

## 2020-11-24 ENCOUNTER — Inpatient Hospital Stay (HOSPITAL_COMMUNITY): Payer: Medicare Other

## 2020-11-25 ENCOUNTER — Encounter (HOSPITAL_COMMUNITY): Payer: Self-pay | Admitting: General Practice

## 2020-11-25 ENCOUNTER — Ambulatory Visit: Payer: Medicare Other | Admitting: Dietician

## 2020-11-25 ENCOUNTER — Inpatient Hospital Stay (HOSPITAL_COMMUNITY): Payer: Medicare Other

## 2020-11-25 ENCOUNTER — Telehealth: Payer: Self-pay | Admitting: Dietician

## 2020-11-25 ENCOUNTER — Ambulatory Visit: Payer: Medicare Other

## 2020-11-25 VITALS — BP 113/77 | HR 76 | Temp 96.8°F | Resp 16 | Wt 138.6 lb

## 2020-11-25 DIAGNOSIS — Z5111 Encounter for antineoplastic chemotherapy: Secondary | ICD-10-CM | POA: Diagnosis not present

## 2020-11-25 DIAGNOSIS — C3492 Malignant neoplasm of unspecified part of left bronchus or lung: Secondary | ICD-10-CM

## 2020-11-25 DIAGNOSIS — D702 Other drug-induced agranulocytosis: Secondary | ICD-10-CM

## 2020-11-25 MED ORDER — HEPARIN SOD (PORK) LOCK FLUSH 100 UNIT/ML IV SOLN
500.0000 [IU] | Freq: Once | INTRAVENOUS | Status: AC
  Administered 2020-11-25: 500 [IU] via INTRAVENOUS

## 2020-11-25 MED ORDER — SODIUM CHLORIDE 0.9% FLUSH
10.0000 mL | Freq: Once | INTRAVENOUS | Status: AC
  Administered 2020-11-25: 10 mL via INTRAVENOUS

## 2020-11-25 MED ORDER — SODIUM CHLORIDE 0.9 % IV SOLN: Freq: Once | INTRAVENOUS | Status: AC

## 2020-11-25 NOTE — Progress Notes (Signed)
[facility] CSW Progress Notes  Met w patient in infusion.  He is struggling w nausea, dry heaves, painful throat - all this is making it very difficult to eat.  Food is tasteless. States he has eaten grits x2 since the weekend.  He had not experienced any treatment effects til now, had been able to maintain his usual activity level, good appetite and similar.  He is "frightened" at this turn of events where he is struggling to eat - knows he needs to maintain his weight/good nutrition and is unable to do so.  He is also concerned about transportation - he has been told that [employer] provided a one-way ride w [place] to [facility] for radiation, then an Melburn Popper was called for trip to [facility].  He did not know he could get transport home from [facility].  Clarified these arrangements for  patient - [facility] is able to get him to all appointments, then home again.  Will encourage patient to use [facility] [employer] vs other options.  Looking at his schedule, it is possible he will present late at second and subsequent appointments due to travel times.  [facility] is aware of the need for prompt pick ups on days when he has follow on appointments at [facility].  CSW will try to reach patient and clarify transportation arrangements, stressing that he needs to make these arrangements in advance w [employer]  Edwyna Shell, LCSW Clinical Social Worker Phone:  [phone]

## 2020-11-25 NOTE — Progress Notes (Signed)
Patient presented today to clinic. No appointment was scheduled for today. Will give hydration fluids per orders per Md today.   Patient complaining of N/V today. Patient states nothing comes up just dry heaves. Patient states he has had nothing to eat since Sunday. He started having difficulty swallowing late last week and this past weekend. Patient states he usually has a great appetitie but nothing lately. Encouraged patient to speak with radiologist and nurse at radiation appt tomorrow. Will also schedule an appointment to follow up with our PA on Friday to get labs and possible fluids. Will send a consult for dietician to see pt.

## 2020-11-25 NOTE — Telephone Encounter (Signed)
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   Spoke with patient via telephone. He reports his appetite was "ferocious" up until Saturday when he began having increased pain and no longer able to the swallow regular foods that he likes to eat.  Patient reports he has been eating soft foods (grits, soup, broth) and drinking Ensure Max TID. He reports some nausea with vomiting vomiting, denies diarrhea, altered taste, dry mouth.   Medications: Lactulose, Carafate, Gabapentin   Labs: 9/23 Glucose 156, BUN 25, Cr 1.70  Anthropometrics: Weight 138 lb 9.6 oz today decreased 1.4% in 7 days which is concerning. Patient has lost 11 lbs (7.4%) over the last 6 weeks which is significant for time frame.   9/20 - 141 lb 1.5 oz 9/19 - 142 lb 6.4 oz 8/17 - 149 lb 4.8 oz   NUTRITION DIAGNOSIS: Unintentional weight loss ongoing   MALNUTRITION DIAGNOSIS: Moderate malnutrition likely continues   INTERVENTION:  Educated on soft moist high protein foods  Discussed ways to alter texture of foods for ease of intake (adding gravy, using blender with added broth, water, cream) Discussed ways to add protein and calories to foods (using milk to prepare creamy soups, adding canned chicken to soups, adding cheese to grits, using milk to prepare oatmeal, adding supplement to ice cream for high calorie/high protein shake) Continue drinking Ensure supplement, recommend increase to 4/day and switch to Ensure Plus/equivalent for added calories Complimentary case of Ensure Enlive (350 kcal, 20 gram protein) left at registration desk for pickup after 9/28 radiation appointment  Handouts and recipes placed in envelope and left at registration desk with supplements for pickup Patient has contact information    MONITORING, EVALUATION, GOAL: weight trends, intake   NEXT VISIT: Tuesday, October 4 during infusion

## 2020-11-25 NOTE — Patient Instructions (Signed)
[facility]  Discharge Instructions: ?Thank you for choosing [facility] to provide your oncology and hematology care.  ?If you have a lab appointment with the Cancer Center, please come in thru the Main Entrance and check in at the main information desk. ? ? ? ?We strive to give you quality time with your provider. You may need to reschedule your appointment if you arrive late (15 or more minutes).  Arriving late affects you and other patients whose appointments are after yours.  Also, if you miss three or more appointments without notifying the office, you may be dismissed from the clinic at the provider?s discretion.    ?  ?For prescription refill requests, have your pharmacy contact our office and allow 72 hours for refills to be completed.   ? ?  ?To help prevent nausea and vomiting after your treatment, we encourage you to take your nausea medication as directed. ? ?BELOW ARE SYMPTOMS THAT SHOULD BE REPORTED IMMEDIATELY: ?*FEVER GREATER THAN 100.4 F (38 ?C) OR HIGHER ?*CHILLS OR SWEATING ?*NAUSEA AND VOMITING THAT IS NOT CONTROLLED WITH YOUR NAUSEA MEDICATION ?*UNUSUAL SHORTNESS OF BREATH ?*UNUSUAL BRUISING OR BLEEDING ?*URINARY PROBLEMS (pain or burning when urinating, or frequent urination) ?*BOWEL PROBLEMS (unusual diarrhea, constipation, pain near the anus) ?TENDERNESS IN MOUTH AND THROAT WITH OR WITHOUT PRESENCE OF ULCERS (sore throat, sores in mouth, or a toothache) ?UNUSUAL RASH, SWELLING OR PAIN  ?UNUSUAL VAGINAL DISCHARGE OR ITCHING  ? ?Items with * indicate a potential emergency and should be followed up as soon as possible or go to the Emergency Department if any problems should occur. ? ?Please show the CHEMOTHERAPY ALERT CARD or IMMUNOTHERAPY ALERT CARD at check-in to the Emergency Department and triage nurse. ? ?Should you have questions after your visit or need to cancel or reschedule your appointment, please contact [facility] [phone]  and follow  the prompts.  Office hours are 8:00 a.m. to 4:30 p.m. Monday - Friday. Please note that voicemails left after 4:00 p.m. may not be returned until the following business day.  We are closed weekends and major holidays. You have access to a nurse at all times for urgent questions. Please call the main number to the clinic [phone] and follow the prompts. ? ?For any non-urgent questions, you may also contact your provider using MyChart. We now offer e-Visits for anyone 18 and older to request care online for non-urgent symptoms. For details visit [email or web address]. ?  ?Also download the MyChart app! Go to the app store, search "MyChart", open the app, select [place], and log in with your MyChart username and password. ? ?Due to Covid, a mask is required upon entering the hospital/clinic. If you do not have a mask, one will be given to you upon arrival. For doctor visits, patients may have 1 support person aged 18 or older with them. For treatment visits, patients cannot have anyone with them due to current Covid guidelines and our immunocompromised population.  ?

## 2020-11-26 ENCOUNTER — Ambulatory Visit: Payer: Medicare Other

## 2020-11-27 ENCOUNTER — Other Ambulatory Visit: Payer: Self-pay

## 2020-11-27 ENCOUNTER — Ambulatory Visit
Admission: RE | Admit: 2020-11-27 | Discharge: 2020-11-27 | Disposition: A | Discharge status: Home / Self Care | Payer: Medicare Other | Source: Ambulatory Visit | Attending: Radiation Oncology | Admitting: Radiation Oncology

## 2020-11-27 ENCOUNTER — Other Ambulatory Visit: Payer: Self-pay | Admitting: Radiation Oncology

## 2020-11-27 DIAGNOSIS — C3492 Malignant neoplasm of unspecified part of left bronchus or lung: Secondary | ICD-10-CM | POA: Diagnosis not present

## 2020-11-27 MED ORDER — OXYCODONE-ACETAMINOPHEN 5-325 MG PO TABS: 1.0000 | ORAL_TABLET | ORAL | 0 refills | Status: DC | PRN

## 2020-11-27 NOTE — Progress Notes (Deleted)
CANCEL / RESCHEDULE

## 2020-11-28 ENCOUNTER — Ambulatory Visit (HOSPITAL_COMMUNITY): Payer: Medicare Other | Admitting: Physician Assistant

## 2020-11-28 ENCOUNTER — Ambulatory Visit: Payer: Medicare Other

## 2020-11-28 ENCOUNTER — Other Ambulatory Visit (HOSPITAL_COMMUNITY): Payer: Medicare Other

## 2020-11-28 ENCOUNTER — Encounter: Payer: Medicare Other | Admitting: Dietician

## 2020-11-28 ENCOUNTER — Ambulatory Visit (HOSPITAL_COMMUNITY): Payer: Medicare Other

## 2020-12-01 ENCOUNTER — Ambulatory Visit: Payer: Medicare Other

## 2020-12-02 ENCOUNTER — Inpatient Hospital Stay (HOSPITAL_COMMUNITY): Payer: Medicare Other | Admitting: Hematology

## 2020-12-02 ENCOUNTER — Ambulatory Visit: Payer: Medicare Other | Attending: Radiation Oncology

## 2020-12-02 ENCOUNTER — Inpatient Hospital Stay (HOSPITAL_COMMUNITY): Payer: Medicare Other | Attending: Hematology and Oncology

## 2020-12-02 ENCOUNTER — Telehealth: Payer: Self-pay | Admitting: Dietician

## 2020-12-02 ENCOUNTER — Inpatient Hospital Stay: Payer: Medicare Other | Attending: Radiation Oncology | Admitting: Dietician

## 2020-12-02 ENCOUNTER — Inpatient Hospital Stay (HOSPITAL_COMMUNITY): Payer: Medicare Other

## 2020-12-02 DIAGNOSIS — C3492 Malignant neoplasm of unspecified part of left bronchus or lung: Secondary | ICD-10-CM | POA: Insufficient documentation

## 2020-12-02 NOTE — Telephone Encounter (Signed)
Nutrition  Patient did not answer for scheduled nutrition follow-up. No option to leave message to request return call. Patient has contact information.

## 2020-12-03 ENCOUNTER — Ambulatory Visit: Payer: Medicare Other

## 2020-12-04 ENCOUNTER — Ambulatory Visit: Payer: Medicare Other

## 2020-12-05 ENCOUNTER — Ambulatory Visit: Payer: Medicare Other

## 2020-12-08 ENCOUNTER — Ambulatory Visit: Payer: Medicare Other

## 2020-12-09 ENCOUNTER — Ambulatory Visit: Payer: Medicare Other

## 2020-12-09 ENCOUNTER — Inpatient Hospital Stay (HOSPITAL_COMMUNITY): Payer: Medicare Other

## 2020-12-09 ENCOUNTER — Telehealth: Payer: Self-pay

## 2020-12-09 ENCOUNTER — Inpatient Hospital Stay (HOSPITAL_COMMUNITY): Payer: Medicare Other | Admitting: Hematology

## 2020-12-09 NOTE — Telephone Encounter (Signed)
Called to check in on patient due to  patient not showing up for his last 7 radiation treatments. Patient did not answer mobile or home phone. Called and spoke with patients neighbor Deneise Lever) whom states that patient does not want to continue with treatments due to transportation issues. I spoke with neighbor about transportation options that are offered by the cancer center. Deneise Lever states she will go to check on patient and give him my contact information so I can discuss transportation with patient. Awaiting call back.

## 2020-12-10 ENCOUNTER — Ambulatory Visit (HOSPITAL_COMMUNITY): Payer: Medicare Other

## 2020-12-10 ENCOUNTER — Ambulatory Visit: Payer: Medicare Other

## 2020-12-11 ENCOUNTER — Ambulatory Visit: Payer: Medicare Other

## 2020-12-11 ENCOUNTER — Ambulatory Visit (HOSPITAL_COMMUNITY): Payer: Medicare Other

## 2020-12-12 ENCOUNTER — Encounter (HOSPITAL_COMMUNITY): Payer: Self-pay | Admitting: Hematology

## 2020-12-12 ENCOUNTER — Ambulatory Visit: Payer: Medicare Other

## 2020-12-15 ENCOUNTER — Ambulatory Visit: Payer: Medicare Other

## 2020-12-15 ENCOUNTER — Telehealth: Payer: Self-pay

## 2020-12-15 NOTE — Telephone Encounter (Signed)
I have attempted to reach out to the patient to see if he wished to continue his treatments. Patient did not answer his home phone or cell phone. I reached out to next contact Deneise Lever). Per Deneise Lever, the patient informed her that he would resume treatment today. Deneise Lever informed me that she reached out to patient after we spoke on the 12/09/20 and gave him my contact information to have transportation arranged. No call received.

## 2020-12-16 ENCOUNTER — Ambulatory Visit: Payer: Medicare Other

## 2020-12-17 ENCOUNTER — Ambulatory Visit: Payer: Medicare Other

## 2020-12-18 ENCOUNTER — Ambulatory Visit: Payer: Medicare Other

## 2020-12-18 ENCOUNTER — Telehealth: Payer: Self-pay | Admitting: Dietician

## 2020-12-18 NOTE — Telephone Encounter (Signed)
Nutrition  Second attempt to reach patient by telephone for nutrition follow-up. No answer on cell phone or home phone and unable to leave message. Patient did not pick up complimentary case of Ensure and handouts left for him at registration desk on 9/28. Handouts have been mailed to patient.

## 2020-12-19 ENCOUNTER — Ambulatory Visit: Payer: Medicare Other

## 2020-12-22 ENCOUNTER — Ambulatory Visit: Payer: Medicare Other

## 2020-12-23 ENCOUNTER — Ambulatory Visit: Payer: Medicare Other

## 2020-12-24 ENCOUNTER — Ambulatory Visit: Payer: Medicare Other

## 2020-12-25 ENCOUNTER — Ambulatory Visit: Payer: Medicare Other

## 2020-12-26 ENCOUNTER — Ambulatory Visit: Payer: Medicare Other

## 2020-12-29 ENCOUNTER — Ambulatory Visit: Payer: Medicare Other

## 2020-12-30 ENCOUNTER — Ambulatory Visit: Payer: Medicare Other

## 2020-12-31 ENCOUNTER — Ambulatory Visit: Payer: Medicare Other

## 2021-01-01 ENCOUNTER — Ambulatory Visit: Payer: Medicare Other

## 2021-01-02 ENCOUNTER — Ambulatory Visit: Payer: Medicare Other

## 2021-03-13 NOTE — Progress Notes (Signed)
Chaplain tried to engage in a follow-up visit with Miguel Colon.  Chaplain was unable to reach him by phone but was able to reach his neighbor Miguel Colon.  Miguel Colon shared that he is doing well and that she was about to make him some lunch.  She voiced that he hasn't been open to talking about getting his treatments again and has alluded the hospitals altogether.  Chaplain asked Miguel Colon to relay to Miguel Colon that Miguel Colon was thinking about him and just wanted to check in.  She also stated that Miguel Colon seemed to be getting back to himself in terms of energy.  Chaplain could assess from last visit months ago that Miguel Colon was having a hard time receiving chemo, what he called "poison."  He talked about how the "poison" was shutting down his kidneys.  Chaplain assesses that Miguel Colon probably believes he is better off without taking chemo and desires some normalcy back in his life.  Chaplain offered support.    03/13/21 1000  Clinical Encounter Type  Visited With Family  Visit Type Follow-up;Spiritual support

## 2021-06-10 ENCOUNTER — Encounter (HOSPITAL_COMMUNITY): Payer: Self-pay

## 2021-06-10 NOTE — Progress Notes (Signed)
Patient called today stating that he has returned from the state of [place] and would like to set up an appt with Dr. Delton Coombes as soon as possible. States that he has been doing well but has not sought any medical care in [place] for his cancer diagnosis. Patient given an appointment to see Dr. Delton Coombes tomorrow, 06/11/2021 at 1015. Patient verbalized understanding. Dr. Delton Coombes aware. ?

## 2021-06-11 ENCOUNTER — Other Ambulatory Visit (HOSPITAL_COMMUNITY): Payer: Self-pay | Admitting: *Deleted

## 2021-06-11 ENCOUNTER — Inpatient Hospital Stay (HOSPITAL_COMMUNITY): Payer: Medicare Other | Attending: Hematology | Admitting: Hematology

## 2021-06-11 VITALS — BP 130/98 | HR 69 | Temp 97.0°F | Resp 18 | Ht 70.0 in | Wt 149.0 lb

## 2021-06-11 DIAGNOSIS — K5900 Constipation, unspecified: Secondary | ICD-10-CM | POA: Insufficient documentation

## 2021-06-11 DIAGNOSIS — M25512 Pain in left shoulder: Secondary | ICD-10-CM | POA: Diagnosis not present

## 2021-06-11 DIAGNOSIS — C3490 Malignant neoplasm of unspecified part of unspecified bronchus or lung: Secondary | ICD-10-CM

## 2021-06-11 DIAGNOSIS — M79602 Pain in left arm: Secondary | ICD-10-CM | POA: Insufficient documentation

## 2021-06-11 DIAGNOSIS — Z87891 Personal history of nicotine dependence: Secondary | ICD-10-CM | POA: Insufficient documentation

## 2021-06-11 DIAGNOSIS — E039 Hypothyroidism, unspecified: Secondary | ICD-10-CM | POA: Diagnosis not present

## 2021-06-11 DIAGNOSIS — Z79899 Other long term (current) drug therapy: Secondary | ICD-10-CM | POA: Insufficient documentation

## 2021-06-11 DIAGNOSIS — C3492 Malignant neoplasm of unspecified part of left bronchus or lung: Secondary | ICD-10-CM | POA: Diagnosis present

## 2021-06-11 MED ORDER — GABAPENTIN 300 MG PO CAPS: 300.0000 mg | ORAL_CAPSULE | Freq: Three times a day (TID) | ORAL | 3 refills | Status: DC

## 2021-06-11 MED ORDER — LACTULOSE 20 GM/30ML PO SOLN: 30.0000 mL | Freq: Two times a day (BID) | ORAL | 3 refills | Status: DC | PRN

## 2021-06-11 MED ORDER — OXYCODONE-ACETAMINOPHEN 10-325 MG PO TABS
1.0000 | ORAL_TABLET | Freq: Three times a day (TID) | ORAL | 0 refills | Status: DC | PRN
Start: 2021-06-11 — End: 2021-07-29

## 2021-06-11 NOTE — Progress Notes (Signed)
? ?Miguel Colon ?[address] ?[place], [place] [address] ? ? ?CLINIC:  ?Medical Oncology/Hematology ? ?PCP:  ?Miguel Richard, MD ?[address] [place] [address] ?[phone] ? ? ?REASON FOR VISIT:  ?Follow-up for small cell lung cancer ? ?PRIOR THERAPY: Cycle 1 of cisplatin and etoposide on 10/07/2020 and XRT started on 10/23/2020 ? ?NGS Results: not done ? ?CURRENT THERAPY: under work-up ? ?BRIEF ONCOLOGIC HISTORY:  ?Oncology History  ?Small cell lung cancer, left ([place])  ?08/04/2020 Imaging  ? No evidence of thoracic aortic aneurysm or dissection. ?  ?5.5 cm mass at the left lung apex, suspicious for primary bronchogenic neoplasm/Pancoast tumor. ?  ?No findings specific for metastatic disease. ?  ?Aortic Atherosclerosis (ICD10-I70.0) and Emphysema (ICD10-J43.9). ?  ?09/02/2020 Imaging  ? MRI brain ?IMPRESSION: ?1. No acute intracranial abnormality. ?2. Mild chronic small vessel ischemic disease. ?  ?09/04/2020 Imaging  ? CT abdomen and pelvis ?1. Left lung base nodule, likely metastatic. ?2. No metastatic disease demonstrated in the abdomen or pelvis. ?3. Diffusely stool-filled colon with prominent stool filled rectum, likely constipation. ?  ?09/05/2020 Pathology Results  ? FINAL MICROSCOPIC DIAGNOSIS:  ? ?A. LUNG, LUL, BIOPSY:  ?- Poorly differentiated carcinoma.  ?- See comment.  ? ?COMMENT:  ? 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  ?Immunohistochemistry will be performed and reported as an addendum.  ? ?ADDENDUM:  ? ?By immunohistochemistry, the neoplastic cells are positive for TTF-1, synaptophysin and chromogranin (dot-like positivity) but negative for cytokeratin 5/6, cytokeratin 7, CD56, p40, and p63.  Overall the  ?morphology and immunophenotype, are consistent with a small cell carcinoma.  ? ? ? ?  ?09/17/2020 Initial Diagnosis  ? Small cell lung cancer, left ([place]) ?  ?09/17/2020 Cancer Staging  ? Staging form: Lung, AJCC 8th Edition ?- Clinical stage from  09/17/2020: Stage IIB (cT3, cN0, cM0) - Signed by Heath Lark, MD on 09/17/2020 ?Stage prefix: Initial diagnosis ? ?  ?10/07/2020 - 11/22/2020 Chemotherapy  ? Patient is on Treatment Plan : LUNG SMALL CELL Cisplatin D1 + Etoposide D1-3 q21d  ?   ? ? ?CANCER STAGING: ? Cancer Staging  ?Small cell lung cancer, left ([place]) ?Staging form: Lung, AJCC 8th Edition ?- Clinical stage from 09/17/2020: Stage IIB (cT3, cN0, cM0) - Signed by Heath Lark, MD on 09/17/2020 ? ? ?INTERVAL HISTORY:  ?[name], a 66 y.o. male, returns for routine follow-up of his small cell lung cancer. [name] was last seen on 11/18/2020.  ? ?Today he reports feeling well. He reports he stopped chemotherapy and radiation due lack of appetite and trouble swallowing. He no longer has trouble swallowing or lack of appetite, but his pain in his left shoulder and arm restarted on 4/3 and is severe accompanied by tingling/numbness. He presented to a ED while in [place], and he was given 5 tablets of either 10 mg or 20 mg Oxycodone to be taken once daily which helped relieve his pain. He has gained 8 lbs since 11/18/20. He continues to live on his own. He denies limited ROM of his left shoulder. He reports constipation for which he has taken OTC stool softener which helped for a short period, but he reports no longer helps.  ? ?REVIEW OF SYSTEMS:  ?Review of Systems  ?Constitutional:  Negative for appetite change, fatigue and unexpected weight change (+8 lbs).  ?HENT:   Negative for trouble swallowing.   ?Gastrointestinal:  Positive for constipation.  ?Musculoskeletal:  Positive for arthralgias (8/10  L arm) and neck pain (8/10).  ?Neurological:  Positive for numbness (L arm).  ?All other systems reviewed and are negative. ? ?PAST MEDICAL/SURGICAL HISTORY:  ?Past Medical History:  ?Diagnosis Date  ? Asthma   ? Back pain   ? Lung cancer ([place])   ? Migraines   ? Thyroid disease   ? ?Past Surgical History:  ?Procedure Laterality Date  ? IR IMAGING GUIDED PORT  INSERTION  09/30/2020  ? THYROIDECTOMY    ? ? ?SOCIAL HISTORY:  ?Social History  ? ?Socioeconomic History  ? Marital status: Legally Separated  ?  Spouse name: Not on file  ? Number of children: 3  ? Years of education: Not on file  ? Highest education level: Not on file  ?Occupational History  ? Occupation: Retired  ?Tobacco Use  ? Smoking status: Former  ?  Packs/day: 0.50  ?  Years: 47.00  ?  Pack years: 23.50  ?  Types: Cigarettes  ?  Quit date: 09/02/2020  ?  Years since quitting: 0.7  ? Smokeless tobacco: Never  ?Vaping Use  ? Vaping Use: Never used  ?Substance and Sexual Activity  ? Alcohol use: No  ? Drug use: No  ? Sexual activity: Not Currently  ?Other Topics Concern  ? Not on file  ?Social History Narrative  ? Not on file  ? ?Social Determinants of Health  ? ?Financial Resource Strain: Medium Risk  ? Difficulty of Paying Living Expenses: Somewhat hard  ?Food Insecurity: No Food Insecurity  ? Worried About Charity fundraiser in the Last Year: Never true  ? Ran Out of Food in the Last Year: Never true  ?Transportation Needs: Unmet Transportation Needs  ? Lack of Transportation (Medical): Yes  ? Lack of Transportation (Non-Medical): Yes  ?Physical Activity: Insufficiently Active  ? Days of Exercise per Week: 5 days  ? Minutes of Exercise per Session: 20 min  ?Stress: No Stress Concern Present  ? Feeling of Stress : Not at all  ?Social Connections: Socially Isolated  ? Frequency of Communication with Friends and Family: Three times a week  ? Frequency of Social Gatherings with Friends and Family: Three times a week  ? Attends Religious Services: Never  ? Active Member of Clubs or Organizations: No  ? Attends Archivist Meetings: Never  ? Marital Status: Separated  ?Intimate Partner Violence: Not At Risk  ? Fear of Current or Ex-Partner: No  ? Emotionally Abused: No  ? Physically Abused: No  ? Sexually Abused: No  ? ? ?FAMILY HISTORY:  ?Family History  ?Problem Relation Age of Onset  ? Hypertension  Mother   ? Hypertension Father   ? Colon cancer Father 75  ? ? ?CURRENT MEDICATIONS:  ?Current Outpatient Medications  ?Medication Sig Dispense Refill  ? acetaminophen (TYLENOL) 500 MG tablet Take 2 tablets (1,000 mg total) by mouth 3 (three) times daily. 30 tablet 0  ? Ensure (ENSURE) Take 237 mLs by mouth daily.    ? ETOPOSIDE IV Inject into the vein every 21 ( twenty-one) days. Days 1-3 every 21 days    ? gabapentin (NEURONTIN) 300 MG capsule Take 1 capsule (300 mg total) by mouth 3 (three) times daily. 90 capsule 3  ? Lactulose 20 GM/30ML SOLN Take 30 mLs (20 g total) by mouth at bedtime. Take 30 ml by mouth every 3 hours until you have a bowel movement, then take 30 ml by mouth daily at bedtime. 473 mL 2  ? levothyroxine (SYNTHROID)  125 MCG tablet Take 2 tablets (250 mcg total) by mouth daily before breakfast. 60 tablet 3  ? methadone (DOLOPHINE) 10 MG tablet Take 1 tablet (10 mg total) by mouth every 8 (eight) hours. 90 tablet 0  ? naloxegol oxalate (MOVANTIK) 25 MG TABS tablet Take 1 tablet (25 mg total) by mouth daily. 30 tablet 3  ? nicotine (NICODERM CQ - DOSED IN MG/24 HOURS) 14 mg/24hr patch Place 1 patch (14 mg total) onto the skin daily. 28 patch 0  ? oxyCODONE-acetaminophen (PERCOCET/ROXICET) 5-325 MG tablet Take 1 tablet by mouth every 4 (four) hours as needed for severe pain. 30 tablet 0  ? polyethylene glycol (MIRALAX) 17 g packet Take 17 g by mouth daily. 30 each 2  ? senna (SENOKOT) 8.6 MG TABS tablet Take 2 tablets (17.2 mg total) by mouth 2 (two) times daily. 120 tablet 0  ? sucralfate (CARAFATE) 1 g tablet Take 1 tablet (1 g total) by mouth 4 (four) times daily -  with meals and at bedtime. Crush and dissolve in 10 mL of warm water prior to swallowing 120 tablet 1  ? traZODone (DESYREL) 50 MG tablet Take 1 tablet (50 mg total) by mouth at bedtime. 30 tablet 3  ? lidocaine (LIDODERM) 5 % Place 1 patch onto the skin daily. Remove & Discard patch within 12 hours or as directed by MD (Patient not  taking: Reported on 06/11/2021) 30 patch 0  ? lidocaine-prilocaine (EMLA) cream Apply to affected area once (Patient not taking: Reported on 06/11/2021) 30 g 3  ? ondansetron (ZOFRAN) 8 MG tablet Take 1 tablet (8 mg tot

## 2021-06-11 NOTE — Patient Instructions (Addendum)
[place] [facility] ?Discharge Instructions ? ? ?You were seen and examined today by Dr. Delton Coombes. ? ?You expressed that you would like to restart treatment for your small cell lung cancer. This will require [place] to arrange for a repeat PET scan and an MRI of your brain. We will get those scheduled for you.  ? ?We sent prescriptions for pain - Percocet 10/325 mg tablets and gabapentin 300 mg.  Take as directed.  We also sent a liquid called Lactulose for constipation.  Use this in conjunction with the stool softener to help and prevent constipation.  ? ?Return as scheduled after scans to discuss treatment options.  ? ? ?Thank you for choosing [place] at [facility] to provide your oncology and hematology care.  To afford each patient quality time with our provider, please arrive at least 15 minutes before your scheduled appointment time.  ? ?If you have a lab appointment with the [facility] please come in thru the Main Entrance and check in at the main information desk. ? ?You need to re-schedule your appointment should you arrive 10 or more minutes late.  We strive to give you quality time with our providers, and arriving late affects you and other patients whose appointments are after yours.  Also, if you no show three or more times for appointments you may be dismissed from the clinic at the providers discretion.     ?Again, thank you for choosing [facility].  Our hope is that these requests will decrease the amount of time that you wait before being seen by our physicians.       ?_____________________________________________________________ ? ?Should you have questions after your visit to [facility], please contact our office at [phone] and follow the prompts.  Our office hours are 8:00 a.m. and 4:30 p.m. Monday - Friday.  Please note that voicemails left after 4:00 p.m. may not be returned until the following business  day.  We are closed weekends and major holidays.  You do have access to a nurse 24-7, just call the main number to the clinic [phone] and do not press any options, hold on the line and a nurse will answer the phone.   ? ?For prescription refill requests, have your pharmacy contact our office and allow 72 hours.   ? ?Due to Covid, you will need to wear a mask upon entering the hospital. If you do not have a mask, a mask will be given to you at the Main Entrance upon arrival. For doctor visits, patients may have 1 support person age 45 or older with them. For treatment visits, patients can not have anyone with them due to social distancing guidelines and our immunocompromised population.  ? ?   ?

## 2021-06-12 ENCOUNTER — Encounter (HOSPITAL_COMMUNITY): Payer: Self-pay | Admitting: Hematology

## 2021-06-18 ENCOUNTER — Encounter (HOSPITAL_COMMUNITY): Admission: RE | Admit: 2021-06-18 | Payer: Medicare Other | Source: Ambulatory Visit

## 2021-06-18 ENCOUNTER — Other Ambulatory Visit (HOSPITAL_COMMUNITY): Payer: Medicare Other

## 2021-06-29 ENCOUNTER — Ambulatory Visit (HOSPITAL_COMMUNITY): Admission: RE | Admit: 2021-06-29 | Payer: Medicare Other | Source: Ambulatory Visit

## 2021-07-02 ENCOUNTER — Ambulatory Visit (HOSPITAL_COMMUNITY): Payer: Medicare Other | Admitting: Hematology

## 2021-07-03 ENCOUNTER — Encounter (HOSPITAL_COMMUNITY): Payer: Self-pay

## 2021-07-03 NOTE — Progress Notes (Signed)
Multiple attempts made to reach patient regarding missed appts. Unable to reach patient, no VM available. ?

## 2021-07-09 ENCOUNTER — Encounter (HOSPITAL_COMMUNITY): Payer: Self-pay

## 2021-07-09 NOTE — Progress Notes (Signed)
VM received from patient requesting a call back. Call back placed to patient who states that he needs his pain medication refill. Explained to patient that he missed his PET scan, MRI and follow-up visit with Dr. Delton Coombes and that narcotics cannot be refilled at this time. Encouraged patient to reschedule according to Dr. Tomie China previous recommendation. Patient states that he is "more concerned with pain management" at this time and that "you all do not understand." Encouraged patient to follow-up with Dr. Tomie China recommendations so that the best plan of care can be comprised for the patient at his next office visit. Patient became angry and states that he will go to the emergency department for pain management and asked that the schedulers call him with a new appointment schedule. Schedule pending. ?

## 2021-07-16 ENCOUNTER — Encounter (HOSPITAL_COMMUNITY): Payer: Self-pay

## 2021-07-16 ENCOUNTER — Encounter (HOSPITAL_COMMUNITY)
Admission: RE | Admit: 2021-07-16 | Discharge: 2021-07-16 | Disposition: A | Discharge status: Home / Self Care | Payer: Medicare Other | Source: Ambulatory Visit | Attending: Hematology | Admitting: Hematology

## 2021-07-16 DIAGNOSIS — C3490 Malignant neoplasm of unspecified part of unspecified bronchus or lung: Secondary | ICD-10-CM | POA: Insufficient documentation

## 2021-07-16 MED ORDER — FLUDEOXYGLUCOSE F - 18 (FDG) INJECTION
8.7000 | Freq: Once | INTRAVENOUS | Status: AC | PRN
  Administered 2021-07-16: 7.85 via INTRAVENOUS

## 2021-07-29 ENCOUNTER — Ambulatory Visit (HOSPITAL_COMMUNITY)
Admission: RE | Admit: 2021-07-29 | Discharge: 2021-07-29 | Disposition: A | Discharge status: Home / Self Care | Payer: Medicare Other | Source: Ambulatory Visit | Attending: Hematology | Admitting: Hematology

## 2021-07-29 ENCOUNTER — Inpatient Hospital Stay (HOSPITAL_COMMUNITY): Payer: Medicare Other | Attending: Hematology

## 2021-07-29 ENCOUNTER — Other Ambulatory Visit (HOSPITAL_COMMUNITY): Payer: Self-pay | Admitting: *Deleted

## 2021-07-29 DIAGNOSIS — C3492 Malignant neoplasm of unspecified part of left bronchus or lung: Secondary | ICD-10-CM | POA: Insufficient documentation

## 2021-07-29 DIAGNOSIS — E039 Hypothyroidism, unspecified: Secondary | ICD-10-CM

## 2021-07-29 DIAGNOSIS — Z87891 Personal history of nicotine dependence: Secondary | ICD-10-CM | POA: Insufficient documentation

## 2021-07-29 DIAGNOSIS — C3490 Malignant neoplasm of unspecified part of unspecified bronchus or lung: Secondary | ICD-10-CM

## 2021-07-29 DIAGNOSIS — C3412 Malignant neoplasm of upper lobe, left bronchus or lung: Secondary | ICD-10-CM

## 2021-07-29 DIAGNOSIS — Z79899 Other long term (current) drug therapy: Secondary | ICD-10-CM | POA: Diagnosis not present

## 2021-07-29 LAB — CBC WITH DIFFERENTIAL/PLATELET
Abs Immature Granulocytes: 0.01 10*3/uL (ref 0.00–0.07)
Basophils Absolute: 0 10*3/uL (ref 0.0–0.1)
Basophils Relative: 1 %
Eosinophils Absolute: 0.1 10*3/uL (ref 0.0–0.5)
Eosinophils Relative: 2 %
HCT: 30.8 % — ABNORMAL LOW (ref 39.0–52.0)
Hemoglobin: 9.9 g/dL — ABNORMAL LOW (ref 13.0–17.0)
Immature Granulocytes: 0 %
Lymphocytes Relative: 37 %
Lymphs Abs: 1.6 10*3/uL (ref 0.7–4.0)
MCH: 32.5 pg (ref 26.0–34.0)
MCHC: 32.1 g/dL (ref 30.0–36.0)
MCV: 101 fL — ABNORMAL HIGH (ref 80.0–100.0)
Monocytes Absolute: 0.5 10*3/uL (ref 0.1–1.0)
Monocytes Relative: 13 %
Neutro Abs: 2 10*3/uL (ref 1.7–7.7)
Neutrophils Relative %: 47 %
Platelets: 207 10*3/uL (ref 150–400)
RBC: 3.05 MIL/uL — ABNORMAL LOW (ref 4.22–5.81)
RDW: 15 % (ref 11.5–15.5)
WBC: 4.2 10*3/uL (ref 4.0–10.5)
nRBC: 0 % (ref 0.0–0.2)

## 2021-07-29 LAB — COMPREHENSIVE METABOLIC PANEL
ALT: 13 U/L (ref 0–44)
AST: 17 U/L (ref 15–41)
Albumin: 3.5 g/dL (ref 3.5–5.0)
Alkaline Phosphatase: 30 U/L — ABNORMAL LOW (ref 38–126)
Anion gap: 4 — ABNORMAL LOW (ref 5–15)
BUN: 28 mg/dL — ABNORMAL HIGH (ref 8–23)
CO2: 29 mmol/L (ref 22–32)
Calcium: 8.4 mg/dL — ABNORMAL LOW (ref 8.9–10.3)
Chloride: 106 mmol/L (ref 98–111)
Creatinine, Ser: 2.08 mg/dL — ABNORMAL HIGH (ref 0.61–1.24)
GFR, Estimated: 35 mL/min — ABNORMAL LOW (ref >60)
Glucose, Bld: 86 mg/dL (ref 70–99)
Potassium: 4.8 mmol/L (ref 3.5–5.1)
Sodium: 139 mmol/L (ref 135–145)
Total Bilirubin: 0.5 mg/dL (ref 0.3–1.2)
Total Protein: 7.5 g/dL (ref 6.5–8.1)

## 2021-07-29 LAB — MAGNESIUM: Magnesium: 2 mg/dL (ref 1.7–2.4)

## 2021-07-29 LAB — TSH: TSH: 131.403 u[IU]/mL — ABNORMAL HIGH (ref 0.350–4.500)

## 2021-07-29 MED ORDER — LEVOTHYROXINE SODIUM 125 MCG PO TABS: 250.0000 ug | ORAL_TABLET | Freq: Every day | ORAL | 3 refills | Status: DC

## 2021-07-29 MED ORDER — LACTULOSE 20 GM/30ML PO SOLN: 30.0000 mL | Freq: Two times a day (BID) | ORAL | 3 refills | Status: DC | PRN

## 2021-07-29 MED ORDER — GADOBUTROL 1 MMOL/ML IV SOLN
7.0000 mL | Freq: Once | INTRAVENOUS | Status: AC | PRN
  Administered 2021-07-29: 7 mL via INTRAVENOUS

## 2021-07-29 MED ORDER — OXYCODONE-ACETAMINOPHEN 10-325 MG PO TABS: 1.0000 | ORAL_TABLET | Freq: Three times a day (TID) | ORAL | 0 refills | Status: DC | PRN

## 2021-08-03 ENCOUNTER — Inpatient Hospital Stay (HOSPITAL_COMMUNITY): Payer: Medicare Other | Admitting: Hematology

## 2021-08-06 ENCOUNTER — Inpatient Hospital Stay (HOSPITAL_COMMUNITY): Payer: Medicare Other | Attending: Hematology | Admitting: Hematology

## 2021-08-06 VITALS — BP 114/80 | HR 84 | Temp 97.5°F | Resp 18 | Ht 70.0 in | Wt 147.2 lb

## 2021-08-06 DIAGNOSIS — C3492 Malignant neoplasm of unspecified part of left bronchus or lung: Secondary | ICD-10-CM | POA: Insufficient documentation

## 2021-08-06 DIAGNOSIS — M79602 Pain in left arm: Secondary | ICD-10-CM | POA: Insufficient documentation

## 2021-08-06 DIAGNOSIS — Z87891 Personal history of nicotine dependence: Secondary | ICD-10-CM | POA: Diagnosis not present

## 2021-08-06 DIAGNOSIS — M25512 Pain in left shoulder: Secondary | ICD-10-CM | POA: Insufficient documentation

## 2021-08-06 DIAGNOSIS — K5900 Constipation, unspecified: Secondary | ICD-10-CM | POA: Insufficient documentation

## 2021-08-06 DIAGNOSIS — C3490 Malignant neoplasm of unspecified part of unspecified bronchus or lung: Secondary | ICD-10-CM

## 2021-08-06 DIAGNOSIS — M542 Cervicalgia: Secondary | ICD-10-CM | POA: Diagnosis not present

## 2021-08-06 DIAGNOSIS — E039 Hypothyroidism, unspecified: Secondary | ICD-10-CM | POA: Insufficient documentation

## 2021-08-06 DIAGNOSIS — D649 Anemia, unspecified: Secondary | ICD-10-CM

## 2021-08-06 DIAGNOSIS — Z79899 Other long term (current) drug therapy: Secondary | ICD-10-CM | POA: Diagnosis not present

## 2021-08-06 NOTE — Progress Notes (Signed)
[place] [facility] [place], [place] [address]   CLINIC:  Medical Oncology/Hematology  PCP:  [name], MD [address] [place] / [place] [address] [phone]   REASON FOR VISIT:  Follow-up for small cell lung cancer  PRIOR THERAPY: Cycle 1 of cisplatin and etoposide on 10/07/2020 and XRT started on 10/23/2020  NGS Results: not done  CURRENT THERAPY: surveillance  BRIEF ONCOLOGIC HISTORY:  Oncology History  Small cell lung cancer, left ([place])  08/04/2020 Imaging   No evidence of thoracic aortic aneurysm or dissection.   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   No findings specific for metastatic disease.   Aortic Atherosclerosis (ICD10-I70.0) and Emphysema (ICD10-J43.9).   09/02/2020 Imaging   MRI brain IMPRESSION: 1. No acute intracranial abnormality. 2. Mild chronic small vessel ischemic disease.   09/04/2020 Imaging   CT abdomen and pelvis 1. Left lung base nodule, likely metastatic. 2. No metastatic disease demonstrated in the abdomen or pelvis. 3. Diffusely stool-filled colon with prominent stool filled rectum, likely constipation.   09/05/2020 Pathology Results   FINAL MICROSCOPIC DIAGNOSIS:   A. LUNG, LUL, BIOPSY:  - Poorly differentiated carcinoma.  - See comment.   COMMENT:   The findings favor poorly differentiated non-small cell carcinoma.  Immunohistochemistry will be performed and reported as an addendum.   ADDENDUM:   By immunohistochemistry, the neoplastic cells are positive for TTF-1, synaptophysin and chromogranin (dot-like positivity) but negative for cytokeratin 5/6, cytokeratin 7, CD56, p40, and p63.  Overall the  morphology and immunophenotype, are consistent with a small cell carcinoma.       09/17/2020 Initial Diagnosis   Small cell lung cancer, left ([place])   09/17/2020 Cancer Staging   Staging form: Lung, AJCC 8th Edition - Clinical stage from  09/17/2020: Stage IIB (cT3, cN0, cM0) - Signed by Heath Lark, MD on 09/17/2020 Stage prefix: Initial diagnosis   10/07/2020 - 11/22/2020 Chemotherapy   Patient is on Treatment Plan : LUNG SMALL CELL Cisplatin D1 + Etoposide D1-3 q21d       CANCER STAGING: Cancer Staging  Small cell lung cancer, left ([facility]) Staging form: Lung, AJCC 8th Edition - Clinical stage from 09/17/2020: Stage IIB (cT3, cN0, cM0) - Signed by Heath Lark, MD on 09/17/2020   INTERVAL HISTORY:  [name], a 66 y.o. male, returns for routine follow-up of his small cell lung cancer. Miguel Colon was last seen on 06/11/2021.   Today he reports feeling good. He reports numbness and intermittent shooting pain in his left shoulder and arm. He is taking 3 tablets of percocet daily which controls the left shoulder and arm pain. He reports he stopped Synthroid for over 2 weeks as his medication was lost in transit.   REVIEW OF SYSTEMS:  Review of Systems  Constitutional:  Negative for appetite change and fatigue.  Gastrointestinal:  Positive for constipation.  Musculoskeletal:  Positive for arthralgias (3/10).  Neurological:  Positive for numbness (L arm).  Psychiatric/Behavioral:  Positive for sleep disturbance.   All other systems reviewed and are negative.   PAST MEDICAL/SURGICAL HISTORY:  Past Medical History:  Diagnosis Date   Asthma    Back pain    Lung cancer ([place])    Migraines    Thyroid disease    Past Surgical History:  Procedure Laterality Date   IR IMAGING GUIDED PORT INSERTION  09/30/2020   THYROIDECTOMY      SOCIAL HISTORY:  Social History  Socioeconomic History   Marital status: Legally Separated    Spouse name: Not on file   Number of children: 3   Years of education: Not on file   Highest education level: Not on file  Occupational History   Occupation: Retired  Tobacco Use   Smoking status: Former    Packs/day: 0.50    Years: 47.00    Total pack years: 23.50    Types: Cigarettes    Quit  date: 09/02/2020    Years since quitting: 0.9   Smokeless tobacco: Never  Vaping Use   Vaping Use: Never used  Substance and Sexual Activity   Alcohol use: No   Drug use: No   Sexual activity: Not Currently  Other Topics Concern   Not on file  Social History Narrative   Not on file   Social Determinants of Health   Financial Resource Strain: Medium Risk (09/16/2020)   Overall Financial Resource Strain (CARDIA)    Difficulty of Paying Living Expenses: Somewhat hard  Food Insecurity: No Food Insecurity (09/16/2020)   Hunger Vital Sign    Worried About Running Out of Food in the Last Year: Never true    [place] in the Last Year: Never true  Transportation Needs: Unmet Transportation Needs (10/16/2020)   PRAPARE - Transportation    Lack of Transportation (Medical): Yes    Lack of Transportation (Non-Medical): Yes  Physical Activity: Insufficiently Active (09/16/2020)   Exercise Vital Sign    Days of Exercise per Week: 5 days    Minutes of Exercise per Session: 20 min  Stress: No Stress Concern Present (09/16/2020)   [place] of Stress : Not at all  Social Connections: Socially Isolated (09/16/2020)   Social Connection and Isolation Panel [NHANES]    Frequency of Communication with Friends and Family: Three times a week    Frequency of Social Gatherings with Friends and Family: Three times a week    Attends Religious Services: Never    Active Member of Clubs or Organizations: No    Attends Archivist Meetings: Never    Marital Status: Separated  Intimate Partner Violence: Not At Risk (09/16/2020)   Humiliation, Afraid, Rape, and Kick questionnaire    Fear of Current or Ex-Partner: No    Emotionally Abused: No    Physically Abused: No    Sexually Abused: No    FAMILY HISTORY:  Family History  Problem Relation Age of Onset   Hypertension Mother    Hypertension Father    Colon cancer  Father 4    CURRENT MEDICATIONS:  Current Outpatient Medications  Medication Sig Dispense Refill   acetaminophen (TYLENOL) 500 MG tablet Take 2 tablets (1,000 mg total) by mouth 3 (three) times daily. 30 tablet 0   Ensure (ENSURE) Take 237 mLs by mouth daily.     ETOPOSIDE IV Inject into the vein every 21 ( twenty-one) days. Days 1-3 every 21 days     gabapentin (NEURONTIN) 300 MG capsule Take 1 capsule (300 mg total) by mouth 3 (three) times daily. 90 capsule 3   Lactulose 20 GM/30ML SOLN Take 30 mLs (20 g total) by mouth 2 (two) times daily as needed. 450 mL 3   levothyroxine (SYNTHROID) 125 MCG tablet Take 2 tablets (250 mcg total) by mouth daily before breakfast. 60 tablet 3   lidocaine (LIDODERM) 5 % Place 1 patch onto the skin daily. Remove & Discard patch  within 12 hours or as directed by MD (Patient not taking: Reported on 06/11/2021) 30 patch 0   lidocaine-prilocaine (EMLA) cream Apply to affected area once (Patient not taking: Reported on 06/11/2021) 30 g 3   methadone (DOLOPHINE) 10 MG tablet Take 1 tablet (10 mg total) by mouth every 8 (eight) hours. 90 tablet 0   naloxegol oxalate (MOVANTIK) 25 MG TABS tablet Take 1 tablet (25 mg total) by mouth daily. 30 tablet 3   nicotine (NICODERM CQ - DOSED IN MG/24 HOURS) 14 mg/24hr patch Place 1 patch (14 mg total) onto the skin daily. 28 patch 0   ondansetron (ZOFRAN) 8 MG tablet Take 1 tablet (8 mg total) by mouth 2 (two) times daily as needed. Start on the third day after cisplatin chemotherapy. (Patient not taking: Reported on 06/11/2021) 30 tablet 1   oxyCODONE-acetaminophen (PERCOCET) 10-325 MG tablet Take 1 tablet by mouth every 8 (eight) hours as needed for pain. 90 tablet 0   polyethylene glycol (MIRALAX) 17 g packet Take 17 g by mouth daily. 30 each 2   prochlorperazine (COMPAZINE) 10 MG tablet Take 1 tablet (10 mg total) by mouth every 6 (six) hours as needed (Nausea or vomiting). (Patient not taking: Reported on 06/11/2021) 30 tablet  1   senna (SENOKOT) 8.6 MG TABS tablet Take 2 tablets (17.2 mg total) by mouth 2 (two) times daily. 120 tablet 0   sucralfate (CARAFATE) 1 g tablet Take 1 tablet (1 g total) by mouth 4 (four) times daily -  with meals and at bedtime. Crush and dissolve in 10 mL of warm water prior to swallowing 120 tablet 1   traZODone (DESYREL) 50 MG tablet Take 1 tablet (50 mg total) by mouth at bedtime. 30 tablet 3   No current facility-administered medications for this visit.   Facility-Administered Medications Ordered in Other Visits  Medication Dose Route Frequency Provider Last Rate Last Admin   etoposide (VEPESID) 120 mg in sodium chloride 0.9 % 500 mL chemo infusion  70 mg/m2 (Treatment Plan Recorded) Intravenous Once Derek Jack, MD        ALLERGIES:  No Known Allergies  PHYSICAL EXAM:  Performance status (ECOG): 1 - Symptomatic but completely ambulatory  There were no vitals filed for this visit. Wt Readings from Last 3 Encounters:  06/11/21 149 lb (67.6 kg)  11/25/20 138 lb 9.6 oz (62.9 kg)  11/18/20 141 lb 1.5 oz (64 kg)   Physical Exam Vitals reviewed.  Constitutional:      Appearance: Normal appearance.  Cardiovascular:     Rate and Rhythm: Normal rate and regular rhythm.     Pulses: Normal pulses.     Heart sounds: Normal heart sounds.  Pulmonary:     Effort: Pulmonary effort is normal.     Breath sounds: Normal breath sounds.  Neurological:     General: No focal deficit present.     Mental Status: He is alert and oriented to person, place, and time.  Psychiatric:        Mood and Affect: Mood normal.        Behavior: Behavior normal.      LABORATORY DATA:  I have reviewed the labs as listed.     Latest Ref Rng & Units 07/29/2021   11:19 AM 11/21/2020   10:12 AM 11/19/2020    9:47 AM  CBC  WBC 4.0 - 10.5 K/uL 4.2  7.2  6.5   Hemoglobin 13.0 - 17.0 g/dL 9.9  8.9  9.4  Hematocrit 39.0 - 52.0 % 30.8  27.0  28.3   Platelets 150 - 400 K/uL 207  206  129        Latest Ref Rng & Units 07/29/2021   11:19 AM 11/21/2020   10:12 AM 11/19/2020    9:47 AM  CMP  Glucose 70 - 99 mg/dL 86  156  142   BUN 8 - 23 mg/dL 28  25  19    Creatinine 0.61 - 1.24 mg/dL 2.08  1.70  1.86   Sodium 135 - 145 mmol/L 139  139  137   Potassium 3.5 - 5.1 mmol/L 4.8  3.5  3.6   Chloride 98 - 111 mmol/L 106  103  101   CO2 22 - 32 mmol/L 29  29  29    Calcium 8.9 - 10.3 mg/dL 8.4  7.0  7.2   Total Protein 6.5 - 8.1 g/dL 7.5  6.7  7.1   Total Bilirubin 0.3 - 1.2 mg/dL 0.5  0.4  0.4   Alkaline Phos 38 - 126 U/L 30  39  34   AST 15 - 41 U/L 17  16  17    ALT 0 - 44 U/L 13  9  9      DIAGNOSTIC IMAGING:  I have independently reviewed the scans and discussed with the patient. MR Brain W Wo Contrast  Result Date: 07/29/2021 CLINICAL DATA:  Small cell lung cancer with weakness EXAM: MRI HEAD WITHOUT AND WITH CONTRAST TECHNIQUE: Multiplanar, multiecho pulse sequences of the brain and surrounding structures were obtained without and with intravenous contrast. CONTRAST:  80mL GADAVIST GADOBUTROL 1 MMOL/ML IV SOLN COMPARISON:  09/02/2020 FINDINGS: Brain: No enhancement or swelling to suggest metastatic disease. Negative for infarct, hemorrhage, hydrocephalus, or collection. Mild chronic small vessel ischemia in the hemispheric white matter. Vascular: Normal flow voids and vascular enhancements Skull and upper cervical spine: Normal marrow signal. No abnormality seen at the right pterygoid to correlate with recent PET CT. C2-3 and C3-4 disc degeneration with cord impingement at C3-4. Sinuses/Orbits: Negative IMPRESSION: 1. No evidence of metastatic disease to the brain. 2. Normal appearance of the right pterygoid which was highlighted on recent PET CT. 3. 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 Electronically Signed   By: Jorje Guild M.D.   On: 07/29/2021 22:14   NM PET Image Restag (PS) Skull Base To Thigh  Result Date: 07/17/2021 CLINICAL DATA:  Subsequent treatment strategy for non-small  cell lung cancer. EXAM: NUCLEAR MEDICINE PET SKULL BASE TO THIGH TECHNIQUE: 7.85 mCi F-18 FDG was injected intravenously. Full-ring PET imaging was performed from the skull base to thigh after the radiotracer. CT data was obtained and used for attenuation correction and anatomic localization. Fasting blood glucose: 95 mg/dl COMPARISON:  Previous study October 02, 2020. FINDINGS: Mediastinal blood pool activity: SUV max 2.19 Liver activity: SUV max NA NECK: No hypermetabolic lymph nodes in the neck. There is intense FDG uptake within the RIGHT lateral pterygoid muscle and no asymmetry with respect to CT appearance. Incidental CT findings: none CHEST: Marked interval decrease in size and metabolic activity associated with the LEFT upper lobe mass. (Image 70/3) 4.5 x 3.0 cm as measured just below the LEFT lung apex, previously measuring 6.1 x 5.6 cm in this location with a maximum SUV of 3.4 today as compared to 10.85 previously. Some soft tissue extends into the LEFT lung apex though this is also diminished in bulk and associated with similar, uniform lower level FDG uptake. Adjacent ribs with some heterogeneity,  cortical boundaries are better defined currently than on previous imaging. Focal area in continuity with low level uptake in the LEFT lung apex projects over musculature in the subscapular region (image 56/3) maximum SUV of 4.34. No signs of adenopathy in the chest. Stable LEFT lower lobe nodule at 11 mm. This does not display increased metabolic activity similar to the prior study. Stable calcified nodular area in the RIGHT upper lobe without increased metabolic activity. Incidental CT findings: RIGHT IJ Port-A-Cath at the caval to atrial junction. Normal heart size without pericardial effusion. Normal caliber of the thoracic aorta, calcified aortic atherosclerosis. Normal caliber central pulmonary vessels. Limited assessment of cardiovascular structures given lack of intravenous contrast. Signs of pulmonary  emphysema worse at the lung apices, moderate to marked as before. ABDOMEN/PELVIS: No abnormal hypermetabolic activity within the liver, pancreas, adrenal glands, or spleen. No hypermetabolic lymph nodes in the abdomen or pelvis. Incidental CT findings: Negative for acute findings related to liver, gallbladder, pancreas, spleen, adrenal glands or the kidneys. Urinary bladder under distended without adjacent stranding or signs of gross inflammation. Stool in the colon, moderate volume and larger volume in the rectum no signs of bowel obstruction, limited assessment of gastrointestinal structures due to lack of mesenteric and visceral fat. Aortic atherosclerosis. No signs of aneurysmal dilation. No adenopathy in the abdomen or in the pelvis. SKELETON: No focal hypermetabolic findings to indicate distant metastatic disease. Generalized mild increased metabolic activity in the LEFT lung apex shows improvement and cortical margins of LEFT first and second ribs are better defined. No new sites of suspected bony involvement. Incidental CT findings: Spinal degenerative changes without acute or destructive bone finding. IMPRESSION: Marked interval improvement with respect to LEFT apical Pancoast tumor. Metabolic activity that extends beyond the confines of the chest wall adjacent to subscapularis muscle is roughly in continuity with mildly increased metabolic activity below. This may reflect muscular activity adjacent to residual activity within the tumor but warrants attention on follow-up. RIGHT lateral pterygoid activity more likely physiologic given no visible difference in these muscles of mastication RIGHT versus LEFT on the CT evaluation. Correlation with symptoms in this area is suggested with attention on follow-up. Stable non FDG avid LEFT lower lobe pulmonary nodule. Abundant stool in the colon and rectum. Correlate with signs of constipation. Electronically Signed   By: Zetta Bills M.D.   On: 07/17/2021 15:58      ASSESSMENT:  1.  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  IHC shows positivity for TTF-1, synaptophysin and chromogranin (dot-like positivity), consistent with small cell lung cancer. - MRI of the brain without contrast on 09/02/2020-no metastasis. - PET scan on 10/02/2020 with 8 cm left apical lung mass, hypermetabolic, highly suspicious for Pancoast tumor with chest wall invasion.  No findings suggestive of mediastinal/hilar adenopathy or other metastatic disease. - 15 pound weight loss in the last 2 months. - MR C-spine on 09/02/2020 with the left apical lung mass likely involving the left brachial plexus.  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  Severe bilateral neural foraminal stenosis at C3-C4. - Chemoradiation therapy with cisplatin and etoposide started on 10/07/2020, received cycle 3 of carboplatin and etoposide on 11/19/2020, lost to follow-up after that. - He did not complete radiation therapy.  2.  Social/family history: - Quit smoking on 08/05/2020.  He was a Administrator. - He  lives by himself. - Paternal aunt died of cancer 28 years ago.  Type unknown.   PLAN:  1.  Stage IIIa (T4 N0 M0) small cell lung cancer: - He was lost to follow-up without completing chemo and radiation therapy in September 2022. - MRI brain (07/29/2021) no evidence of metastatic disease.  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  Normal appearance of the right pterygoid. - PET scan (07/16/2021): Marked interval improvement in the left apical Pancoast tumor. - Reviewed labs today which showed creatinine 2.08 and normal LFTs.  CBC was grossly normal with hemoglobin 9.9. - Recommend repeat imaging of the CT of the chest with contrast in 2 months.  We will also check ferritin and iron panel, W09 and folic acid with routine labs at  next visit for anemia work-up.  2.  Left shoulder and arm pain: - Pain started coming back around 06/08/2021. - Continue Percocet 10/325 every 8 hours which is helping.  Continue gabapentin 3 times daily. - As his PET scan did not show any active malignancy, I have recommended to rule out radiculopathy.  We will order MRI of the C-spine with and without contrast.  3.  Constipation: - Continue stool softeners and lactulose as needed..  4.  Hypothyroidism: - Continue Synthroid 50 mcg daily.  Last TSH was 131 on 07/29/2021.  He reported that he did not take Synthroid prior to that for few weeks.  I have sent a prescription for him to start back on it.   Orders placed this encounter:  No orders of the defined types were placed in this encounter.    Derek Jack, MD [place] [phone]   I, Thana Ates, am acting as a scribe for Dr. Derek Jack.  I, Derek Jack MD, have reviewed the above documentation for accuracy and completeness, and I agree with the above.

## 2021-08-20 ENCOUNTER — Ambulatory Visit (HOSPITAL_COMMUNITY): Payer: Medicare Other

## 2021-09-03 ENCOUNTER — Ambulatory Visit (HOSPITAL_COMMUNITY)
Admission: RE | Admit: 2021-09-03 | Discharge: 2021-09-03 | Disposition: A | Discharge status: Home / Self Care | Payer: Medicare Other | Source: Ambulatory Visit | Attending: Hematology | Admitting: Hematology

## 2021-09-03 ENCOUNTER — Other Ambulatory Visit (HOSPITAL_COMMUNITY): Payer: Self-pay | Admitting: *Deleted

## 2021-09-03 DIAGNOSIS — E039 Hypothyroidism, unspecified: Secondary | ICD-10-CM

## 2021-09-03 DIAGNOSIS — M542 Cervicalgia: Secondary | ICD-10-CM | POA: Insufficient documentation

## 2021-09-03 MED ORDER — LACTULOSE 20 GM/30ML PO SOLN
30.0000 mL | Freq: Two times a day (BID) | ORAL | 3 refills | Status: DC | PRN
Start: 2021-09-03 — End: 2021-09-28

## 2021-09-03 MED ORDER — LEVOTHYROXINE SODIUM 125 MCG PO TABS: 250.0000 ug | ORAL_TABLET | Freq: Every day | ORAL | 3 refills | Status: DC

## 2021-09-03 MED ORDER — GADOBUTROL 1 MMOL/ML IV SOLN
7.0000 mL | Freq: Once | INTRAVENOUS | Status: AC | PRN
  Administered 2021-09-03: 7 mL via INTRAVENOUS

## 2021-09-03 MED ORDER — OXYCODONE-ACETAMINOPHEN 10-325 MG PO TABS: 1.0000 | ORAL_TABLET | Freq: Three times a day (TID) | ORAL | 0 refills | Status: DC | PRN

## 2021-09-17 ENCOUNTER — Encounter (HOSPITAL_COMMUNITY): Payer: Self-pay | Admitting: Lab

## 2021-09-17 NOTE — Progress Notes (Unsigned)
Referral to [facility] .  Records faxed on 7/20

## 2021-09-28 ENCOUNTER — Other Ambulatory Visit (HOSPITAL_COMMUNITY): Payer: Self-pay

## 2021-09-28 ENCOUNTER — Other Ambulatory Visit (HOSPITAL_COMMUNITY): Payer: Self-pay | Admitting: *Deleted

## 2021-09-28 DIAGNOSIS — E039 Hypothyroidism, unspecified: Secondary | ICD-10-CM

## 2021-09-28 MED ORDER — OXYCODONE-ACETAMINOPHEN 10-325 MG PO TABS: 1.0000 | ORAL_TABLET | Freq: Three times a day (TID) | ORAL | 0 refills | Status: DC | PRN

## 2021-09-28 MED ORDER — LEVOTHYROXINE SODIUM 125 MCG PO TABS: 250.0000 ug | ORAL_TABLET | Freq: Every day | ORAL | 3 refills | Status: DC

## 2021-09-28 MED ORDER — LACTULOSE 20 GM/30ML PO SOLN: 30.0000 mL | Freq: Two times a day (BID) | ORAL | 3 refills | Status: DC | PRN

## 2021-10-05 ENCOUNTER — Inpatient Hospital Stay: Payer: Medicare Other

## 2021-10-05 ENCOUNTER — Inpatient Hospital Stay: Payer: Medicare Other | Admitting: Hematology

## 2021-10-30 ENCOUNTER — Other Ambulatory Visit: Payer: Self-pay

## 2021-10-30 MED ORDER — OXYCODONE-ACETAMINOPHEN 10-325 MG PO TABS: 1.0000 | ORAL_TABLET | Freq: Three times a day (TID) | ORAL | 0 refills | Status: DC | PRN

## 2021-11-06 ENCOUNTER — Inpatient Hospital Stay: Payer: Medicare Other

## 2021-11-06 ENCOUNTER — Ambulatory Visit (HOSPITAL_COMMUNITY): Payer: Medicare Other

## 2021-11-19 ENCOUNTER — Ambulatory Visit (HOSPITAL_COMMUNITY): Payer: Medicare Other

## 2021-11-19 ENCOUNTER — Telehealth: Payer: Self-pay | Admitting: *Deleted

## 2021-11-19 ENCOUNTER — Inpatient Hospital Stay: Payer: Medicare Other

## 2021-11-19 NOTE — Telephone Encounter (Signed)
Received a call from patient to advise that he tested positive for COVID approximately a week ago.  He has been seen in urgent care and care instructions given by them.  He has lab and CT scheduled for today, however he is still symptomatic with 102 fevers.  We will schedule for 10 days from today.  Advised that he report to the ER if he continues to run fevers and experience symptoms.  Verbalized understanding.

## 2021-11-23 ENCOUNTER — Inpatient Hospital Stay: Payer: Medicare Other | Admitting: Hematology

## 2021-11-30 ENCOUNTER — Other Ambulatory Visit: Payer: Self-pay

## 2021-11-30 ENCOUNTER — Other Ambulatory Visit: Payer: Self-pay | Admitting: *Deleted

## 2021-11-30 DIAGNOSIS — E039 Hypothyroidism, unspecified: Secondary | ICD-10-CM

## 2021-11-30 MED ORDER — LEVOTHYROXINE SODIUM 125 MCG PO TABS: 250.0000 ug | ORAL_TABLET | Freq: Every day | ORAL | 3 refills | Status: DC

## 2021-11-30 MED ORDER — OXYCODONE-ACETAMINOPHEN 10-325 MG PO TABS
1.0000 | ORAL_TABLET | Freq: Three times a day (TID) | ORAL | 0 refills | Status: DC | PRN
Start: 2021-11-30 — End: 2021-12-21

## 2021-12-03 ENCOUNTER — Inpatient Hospital Stay: Payer: Medicare Other | Attending: Hematology

## 2021-12-03 ENCOUNTER — Ambulatory Visit (HOSPITAL_COMMUNITY): Admission: RE | Admit: 2021-12-03 | Payer: Medicare Other | Source: Ambulatory Visit

## 2021-12-10 ENCOUNTER — Ambulatory Visit: Payer: Medicare Other | Admitting: Hematology

## 2021-12-21 ENCOUNTER — Other Ambulatory Visit: Payer: Self-pay | Admitting: *Deleted

## 2021-12-21 DIAGNOSIS — E039 Hypothyroidism, unspecified: Secondary | ICD-10-CM

## 2021-12-21 MED ORDER — LEVOTHYROXINE SODIUM 125 MCG PO TABS: 250.0000 ug | ORAL_TABLET | Freq: Every day | ORAL | 3 refills | Status: DC

## 2021-12-21 MED ORDER — OXYCODONE-ACETAMINOPHEN 10-325 MG PO TABS: 1.0000 | ORAL_TABLET | Freq: Three times a day (TID) | ORAL | 0 refills | Status: DC | PRN

## 2022-01-26 ENCOUNTER — Other Ambulatory Visit: Payer: Self-pay

## 2022-01-26 DIAGNOSIS — E039 Hypothyroidism, unspecified: Secondary | ICD-10-CM

## 2022-01-28 MED ORDER — LEVOTHYROXINE SODIUM 125 MCG PO TABS: 250.0000 ug | ORAL_TABLET | Freq: Every day | ORAL | 3 refills | Status: DC

## 2022-01-28 MED ORDER — OXYCODONE-ACETAMINOPHEN 10-325 MG PO TABS: 1.0000 | ORAL_TABLET | Freq: Three times a day (TID) | ORAL | 0 refills | Status: DC | PRN

## 2022-02-02 ENCOUNTER — Ambulatory Visit (HOSPITAL_COMMUNITY): Admission: RE | Admit: 2022-02-02 | Payer: Medicare Other | Source: Ambulatory Visit

## 2022-02-02 ENCOUNTER — Inpatient Hospital Stay: Payer: Medicare Other | Attending: Hematology

## 2022-02-09 ENCOUNTER — Ambulatory Visit: Payer: Medicare Other | Admitting: Hematology

## 2022-05-19 ENCOUNTER — Emergency Department (HOSPITAL_COMMUNITY): Payer: 59

## 2022-05-19 ENCOUNTER — Emergency Department (HOSPITAL_COMMUNITY)
Admission: EM | Admit: 2022-05-19 | Discharge: 2022-05-19 | Disposition: A | Discharge status: Home / Self Care | Payer: 59 | Attending: Emergency Medicine | Admitting: Emergency Medicine

## 2022-05-19 ENCOUNTER — Other Ambulatory Visit: Payer: Self-pay

## 2022-05-19 ENCOUNTER — Encounter (HOSPITAL_COMMUNITY): Payer: Self-pay

## 2022-05-19 ENCOUNTER — Encounter (HOSPITAL_COMMUNITY): Payer: Self-pay | Admitting: Hematology

## 2022-05-19 DIAGNOSIS — M79602 Pain in left arm: Secondary | ICD-10-CM

## 2022-05-19 DIAGNOSIS — M542 Cervicalgia: Secondary | ICD-10-CM | POA: Diagnosis present

## 2022-05-19 DIAGNOSIS — N189 Chronic kidney disease, unspecified: Secondary | ICD-10-CM | POA: Diagnosis not present

## 2022-05-19 DIAGNOSIS — Z85118 Personal history of other malignant neoplasm of bronchus and lung: Secondary | ICD-10-CM | POA: Insufficient documentation

## 2022-05-19 DIAGNOSIS — E039 Hypothyroidism, unspecified: Secondary | ICD-10-CM | POA: Diagnosis not present

## 2022-05-19 DIAGNOSIS — M5412 Radiculopathy, cervical region: Secondary | ICD-10-CM | POA: Diagnosis not present

## 2022-05-19 DIAGNOSIS — D649 Anemia, unspecified: Secondary | ICD-10-CM | POA: Insufficient documentation

## 2022-05-19 LAB — CBC WITH DIFFERENTIAL/PLATELET
Abs Immature Granulocytes: 0.01 10*3/uL (ref 0.00–0.07)
Basophils Absolute: 0 10*3/uL (ref 0.0–0.1)
Basophils Relative: 1 %
Eosinophils Absolute: 0.1 10*3/uL (ref 0.0–0.5)
Eosinophils Relative: 2 %
HCT: 36.6 % — ABNORMAL LOW (ref 39.0–52.0)
Hemoglobin: 12 g/dL — ABNORMAL LOW (ref 13.0–17.0)
Immature Granulocytes: 0 %
Lymphocytes Relative: 28 %
Lymphs Abs: 1.3 10*3/uL (ref 0.7–4.0)
MCH: 32.4 pg (ref 26.0–34.0)
MCHC: 32.8 g/dL (ref 30.0–36.0)
MCV: 98.9 fL (ref 80.0–100.0)
Monocytes Absolute: 0.4 10*3/uL (ref 0.1–1.0)
Monocytes Relative: 9 %
Neutro Abs: 2.7 10*3/uL (ref 1.7–7.7)
Neutrophils Relative %: 60 %
Platelets: 193 10*3/uL (ref 150–400)
RBC: 3.7 MIL/uL — ABNORMAL LOW (ref 4.22–5.81)
RDW: 15 % (ref 11.5–15.5)
WBC: 4.5 10*3/uL (ref 4.0–10.5)
nRBC: 0 % (ref 0.0–0.2)

## 2022-05-19 LAB — COMPREHENSIVE METABOLIC PANEL
ALT: 17 U/L (ref 0–44)
AST: 23 U/L (ref 15–41)
Albumin: 3.8 g/dL (ref 3.5–5.0)
Alkaline Phosphatase: 36 U/L — ABNORMAL LOW (ref 38–126)
Anion gap: 7 (ref 5–15)
BUN: 30 mg/dL — ABNORMAL HIGH (ref 8–23)
CO2: 27 mmol/L (ref 22–32)
Calcium: 8.7 mg/dL — ABNORMAL LOW (ref 8.9–10.3)
Chloride: 105 mmol/L (ref 98–111)
Creatinine, Ser: 1.99 mg/dL — ABNORMAL HIGH (ref 0.61–1.24)
GFR, Estimated: 37 mL/min — ABNORMAL LOW (ref >60)
Glucose, Bld: 88 mg/dL (ref 70–99)
Potassium: 4.3 mmol/L (ref 3.5–5.1)
Sodium: 139 mmol/L (ref 135–145)
Total Bilirubin: 0.4 mg/dL (ref 0.3–1.2)
Total Protein: 8 g/dL (ref 6.5–8.1)

## 2022-05-19 LAB — TROPONIN I (HIGH SENSITIVITY)
Troponin I (High Sensitivity): 4 ng/L (ref <18)
Troponin I (High Sensitivity): 3 ng/L (ref <18)

## 2022-05-19 MED ORDER — MORPHINE SULFATE (PF) 4 MG/ML IV SOLN
4.0000 mg | Freq: Once | INTRAVENOUS | Status: AC
  Administered 2022-05-19: 4 mg via INTRAVENOUS
  Filled 2022-05-19: qty 1

## 2022-05-19 MED ORDER — OXYCODONE-ACETAMINOPHEN 5-325 MG PO TABS: 1.0000 | ORAL_TABLET | Freq: Four times a day (QID) | ORAL | 0 refills | Status: DC | PRN

## 2022-05-19 MED ORDER — PREDNISONE 10 MG (21) PO TBPK: ORAL_TABLET | Freq: Every day | ORAL | 0 refills | Status: DC

## 2022-05-19 NOTE — ED Notes (Signed)
Patient transported to CT 

## 2022-05-19 NOTE — ED Provider Notes (Signed)
[facility] Provider Note   CSN: [id number] Arrival date & time: 05/19/22  1159     History  Chief Complaint  Patient presents with   Neck Pain   Shoulder Pain    Miguel Colon is a 67 y.o. 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  He reports the pain is sharp/shooting.  Patient reports it feels very similar to nerve pain related to his tumor.  Patient reports that he is not currently taking any chemotherapy or radiation, and had been in remission since last July.  He has not seen his oncologist since then.  Patient reports sometimes it feels like it is radiating into the chest and feels shortness of breath, but mostly radiating from neck into the left arm.   Neck Pain Shoulder Pain Associated symptoms: neck pain        Home Medications Prior to Admission medications   Medication Sig Start Date End Date Taking? Authorizing Provider  oxyCODONE-acetaminophen (PERCOCET/ROXICET) 5-325 MG tablet Take 1 tablet by mouth every 6 (six) hours as needed for severe pain. 05/19/22  Yes Curt Oatis H, PA-C  predniSONE (STERAPRED UNI-PAK 21 TAB) 10 MG (21) TBPK tablet Take by mouth daily. Take 6 tabs by mouth daily  for 2 days, then 5 tabs for 2 days, then 4 tabs for 2 days, then 3 tabs for 2 days, 2 tabs for 2 days, then 1 tab by mouth daily for 2 days 05/19/22  Yes Fredrik Mogel H, PA-C  acetaminophen (TYLENOL) 500 MG tablet Take 2 tablets (1,000 mg total) by mouth 3 (three) times daily. 09/09/20   Acquanetta Chain, DO  Ensure (ENSURE) Take 237 mLs by mouth daily.    [provider]  ETOPOSIDE IV Inject into the vein every 21 ( twenty-one) days. Days 1-3 every 21 days    [provider]  gabapentin (NEURONTIN)  300 MG capsule Take 1 capsule (300 mg total) by mouth 3 (three) times daily. 06/11/21   Derek Jack, MD  Lactulose 20 GM/30ML SOLN Take 30 mLs (20 g total) by mouth 2 (two) times daily as needed. 09/28/21   Derek Jack, MD  levothyroxine (SYNTHROID) 125 MCG tablet Take 2 tablets (250 mcg total) by mouth daily before breakfast. 01/28/22   Derek Jack, MD  lidocaine (LIDODERM) 5 % Place 1 patch onto the skin daily. Remove & Discard patch within 12 hours or as directed by MD 09/09/20   Acquanetta Chain, DO  lidocaine-prilocaine (EMLA) cream Apply to affected area once Patient not taking: Reported on 08/06/2021 09/17/20   Heath Lark, MD  methadone (DOLOPHINE) 10 MG tablet Take 1 tablet (10 mg total) by mouth every 8 (eight) hours. 10/14/20   Harriett Rush, PA-C  naloxegol oxalate (MOVANTIK) 25 MG TABS tablet Take 1 tablet (25 mg total) by mouth daily. 09/09/20   Acquanetta Chain, DO  nicotine (NICODERM CQ - DOSED IN MG/24 HOURS) 14 mg/24hr patch Place 1 patch (14 mg total) onto the skin daily. 09/09/20   Acquanetta Chain, DO  ondansetron (ZOFRAN) 8 MG tablet Take 1 tablet (8 mg total) by mouth 2 (two) times daily as needed. Start on the third day after cisplatin chemotherapy. Patient not taking: Reported on 08/06/2021 09/17/20   Heath Lark, MD  polyethylene glycol (MIRALAX) 17 g packet  Take 17 g by mouth daily. 09/17/20   Heath Lark, MD  prochlorperazine (COMPAZINE) 10 MG tablet Take 1 tablet (10 mg total) by mouth every 6 (six) hours as needed (Nausea or vomiting). Patient not taking: Reported on 08/06/2021 09/17/20   Heath Lark, MD  senna (SENOKOT) 8.6 MG TABS tablet Take 2 tablets (17.2 mg total) by mouth 2 (two) times daily. 09/17/20   Heath Lark, MD  sucralfate (CARAFATE) 1 g tablet Take 1 tablet (1 g total) by mouth 4 (four) times daily -  with meals and at bedtime. Crush and dissolve in 10 mL of warm water prior to swallowing 11/05/20   Gery Pray, MD  traZODone  (DESYREL) 50 MG tablet Take 1 tablet (50 mg total) by mouth at bedtime. 09/09/20   Acquanetta Chain, DO      Allergies    Patient has no known allergies.    Review of Systems   Review of Systems  Musculoskeletal:  Positive for neck pain.  All other systems reviewed and are negative.   Physical Exam Updated Vital Signs BP (!) 136/97 (BP Location: Right Arm)   Pulse 68   Temp 98.7 F (37.1 C) (Oral)   Resp 20   Ht 5\' 10"  (1.778 m)   Wt 70.3 kg   SpO2 99%   BMI 22.24 kg/m  Physical Exam Vitals and nursing note reviewed.  Constitutional:      General: He is not in acute distress.    Appearance: Normal appearance.  HENT:     Head: Normocephalic and atraumatic.  Eyes:     General:        Right eye: No discharge.        Left eye: No discharge.  Neck:     Comments: Tenderness to palpation of the left lateral neck, no midline spinal tenderness, step-off, deformity.  Normal range of motion throughout.  No meningismus. Cardiovascular:     Rate and Rhythm: Normal rate and regular rhythm.     Pulses: Normal pulses.     Comments: Radial, ulnar pulses intact, 2+ left Pulmonary:     Effort: Pulmonary effort is normal. No respiratory distress.  Musculoskeletal:        General: No deformity.     Comments: Significant tenderness to palpation radiating from left neck into left shoulder, left arm.  Intact strength of bilateral upper extremities without weakness or coordination abnormality  Skin:    General: Skin is warm and dry.     Capillary Refill: Capillary refill takes less than 2 seconds.  Neurological:     Mental Status: He is alert and oriented to person, place, and time.  Psychiatric:        Mood and Affect: Mood normal.        Behavior: Behavior normal.     ED Results / Procedures / Treatments   Labs (all labs ordered are listed, but only abnormal results are displayed) Labs Reviewed  COMPREHENSIVE METABOLIC PANEL - Abnormal; Notable for the following components:       Result Value   BUN 30 (*)    Creatinine, Ser 1.99 (*)    Calcium 8.7 (*)    Alkaline Phosphatase 36 (*)    GFR, Estimated 37 (*)    All other components within normal limits  CBC WITH DIFFERENTIAL/PLATELET - Abnormal; Notable for the following components:   RBC 3.70 (*)    Hemoglobin 12.0 (*)    HCT 36.6 (*)    All other components within  normal limits  TROPONIN I (HIGH SENSITIVITY)    EKG EKG Interpretation  Date/Time:  Wednesday May 19 2022 12:34:29 EDT Ventricular Rate:  65 PR Interval:  146 QRS Duration: 76 QT Interval:  393 QTC Calculation: 409 R Axis:   77 Text Interpretation: Sinus rhythm No significant change since last tracing Confirmed by Dorie Rank [phone]) on 05/19/2022 12:50:27 PM  Radiology CT Cervical Spine Wo Contrast  Result Date: 05/19/2022 CLINICAL DATA:  Neck pain, left shoulder pain EXAM: CT CERVICAL SPINE WITHOUT CONTRAST TECHNIQUE: Multidetector CT imaging of the cervical spine was performed without intravenous contrast. Multiplanar CT image reconstructions were also generated. RADIATION DOSE REDUCTION: This exam was performed according to the departmental dose-optimization program which includes automated exposure control, adjustment of the mA and/or kV according to patient size and/or use of iterative reconstruction technique. COMPARISON:  None Available. FINDINGS: Alignment: There is minimal retrolisthesis at C3-C4 level, most likely due to previous ligament injury and facet degeneration. Skull base and vertebrae: No recent fracture is seen. Degenerative changes are noted with bony spurs from C2-C7 levels. Soft tissues and spinal canal: There is extrinsic pressure over the ventral margin of thecal sac caused by posterior bony spurs at multiple levels, most severe at C3-C4 level with moderate spinal stenosis. Disc levels: There is marked encroachment of neural foramina at C3-C4 level. There is mild encroachment of neural foramina at other levels, more so  on the right side at C2-C3 level. Upper chest: There is asymmetric pleural thickening in the left apex along with calcifications. Other: None. IMPRESSION: No recent fracture is seen. Degenerative changes are noted with moderate spinal stenosis at C3-C4 level. There is encroachment of neural foramina at multiple levels, more severe at C3-C4 level. If clinically warranted, follow-up MRI may be considered to evaluate the spinal canal and neural foramina. Electronically Signed   By: Elmer Picker M.D.   On: 05/19/2022 13:24   DG Chest Portable 1 View  Result Date: 05/19/2022 CLINICAL DATA:  Chest pain. EXAM: PORTABLE CHEST 1 VIEW COMPARISON:  August 04, 2020. FINDINGS: The heart size and mediastinal contours are within normal limits. Interval placement of right internal jugular Port-A-Cath with distal tip in expected position of the SVC. Hyperexpansion of the lungs is again noted with emphysematous disease. No acute pulmonary disease is noted. The visualized skeletal structures are unremarkable. IMPRESSION: Hyperexpansion of the lungs with emphysematous change. No acute abnormality seen. Emphysema (ICD10-J43.9). Electronically Signed   By: Marijo Conception M.D.   On: 05/19/2022 13:04    Procedures Procedures    Medications Ordered in ED Medications  morphine (PF) 4 MG/ML injection 4 mg (4 mg Intravenous Given 05/19/22 1256)    ED Course/ Medical Decision Making/ A&P                             Medical Decision Making Amount and/or Complexity of Data Reviewed Labs: ordered. Radiology: ordered.  Risk Prescription drug management.   This patient is a 67 y.o. male  who presents to the ED for concern of left-sided neck, shoulder, arm pain, without weakness, numbness.   Differential diagnoses prior to evaluation: The emergent differential diagnosis includes, but is not limited to, radiculopathy, cervical strain, tumor metastasis, spinal cord impingement, Pancoast tumor effect, versus other.  This is not an exhaustive differential.   Past Medical History / Co-morbidities: Left-sided small cell lung cancer currently in remission, previous Pancoast syndrome on left.  Hyperlipidemia, hypothyroid  Additional history: Chart reviewed. Pertinent results include: Reviewed lab work, imaging from previous emergency department visits, outpatient oncology visits  Physical Exam: Physical exam performed. The pertinent findings include: Significant tenderness to palpation radiating from left neck into left shoulder, left arm.  Intact strength of bilateral upper extremities without weakness or coordination abnormality. Tenderness to palpation of the left lateral neck, no midline spinal tenderness, step-off, deformity.  Normal range of motion throughout.  No meningismus.   Lab Tests/Imaging studies: I personally interpreted labs/imaging and the pertinent results include: CMP notable for elevated BUN, creatinine significantly changed from baseline CKD.  CBC notable for mild anemia, hemoglobin 12.0.  Troponin x 1 is unremarkable at 4..  I independently interpreted CT cervical spine without contrast which shows some degenerative changes at C3/4 with encroachment into the neural foramina.  Plain film chest x-ray shows emphysematous changes without any new mass, consolidation, or evidence of recurrent cancer.  I agree with the radiologist interpretation.  Cardiac monitoring: EKG obtained and interpreted by my attending physician which shows: Normal sinus rhythm, no significant change since last tracing   Medications: I ordered medication including morphine for pain, will discharge with steroids, Percocet to help with pain, inflammation, encouraged neurosurgical follow-up, oncology follow-up.  I have reviewed the patients home medicines and have made adjustments as needed.   Disposition: After consideration of the diagnostic results and the patients response to treatment, I feel that patient is stable for  discharge at this time with symptoms consistent with left-sided cervical radiculopathy without any weakness, no evidence of cancer recurrence.   emergency department workup does not suggest an emergent condition requiring admission or immediate intervention beyond what has been performed at this time. The plan is: as above. The patient is safe for discharge and has been instructed to return immediately for worsening symptoms, change in symptoms or any other concerns.  Final Clinical Impression(s) / ED Diagnoses Final diagnoses:  Cervical radiculopathy  Left arm pain    Rx / DC Orders ED Discharge Orders          Ordered    oxyCODONE-acetaminophen (PERCOCET/ROXICET) 5-325 MG tablet  Every 6 hours PRN        05/19/22 1354    predniSONE (STERAPRED UNI-PAK 21 TAB) 10 MG (21) TBPK tablet  Daily        05/19/22 1354              Anselmo Pickler, PA-C 05/19/22 1406    Dorie Rank, MD 05/20/22 Karl Bales

## 2022-05-19 NOTE — ED Triage Notes (Signed)
Pt comes from home with c/o intermittent L side neck, shoulder and arm pain since Monday. Describes pain as sharp and shooting, pt states he believes it is nerve pain related to tumor (L lung). States he has similar pain when tumor was discovered and was told it was due to pinched nerve. Pt took tylenol and motrin with no relief.   Denies chest pain, SHOB, or weakness.   In remission since last July, not seen oncologist since. Port to R chest.

## 2022-05-19 NOTE — Discharge Instructions (Signed)
I think that the condition is causing the radiation of pain down your left arm is secondary to cervical radiculopathy.  Do not see any evidence of cancer recurrence on your imaging today, however I do recommend that you follow-up with your oncologist to see if they want to repeat a PET scan given that you are having some spinal nerve pain like you were previously with your cancer.  Additionally please follow-up with the neurosurgeon whose contact information I provided to follow-up on the cervical radiculopathy and assess the need for additional treatment after your steroids.

## 2022-05-20 ENCOUNTER — Encounter (INDEPENDENT_AMBULATORY_CARE_PROVIDER_SITE_OTHER): Payer: Self-pay | Admitting: *Deleted

## 2022-06-16 ENCOUNTER — Inpatient Hospital Stay (HOSPITAL_COMMUNITY)
Admission: EM | Admit: 2022-06-16 | Discharge: 2022-06-21 | DRG: 683 | Disposition: A | Discharge status: Home / Self Care | Payer: 59 | Attending: Internal Medicine | Admitting: Internal Medicine

## 2022-06-16 ENCOUNTER — Encounter (HOSPITAL_COMMUNITY): Payer: Self-pay

## 2022-06-16 ENCOUNTER — Other Ambulatory Visit: Payer: Self-pay

## 2022-06-16 ENCOUNTER — Emergency Department (HOSPITAL_COMMUNITY): Payer: 59

## 2022-06-16 DIAGNOSIS — R413 Other amnesia: Secondary | ICD-10-CM | POA: Diagnosis present

## 2022-06-16 DIAGNOSIS — Z8249 Family history of ischemic heart disease and other diseases of the circulatory system: Secondary | ICD-10-CM

## 2022-06-16 DIAGNOSIS — R9341 Abnormal radiologic findings on diagnostic imaging of renal pelvis, ureter, or bladder: Secondary | ICD-10-CM | POA: Diagnosis present

## 2022-06-16 DIAGNOSIS — D631 Anemia in chronic kidney disease: Secondary | ICD-10-CM | POA: Diagnosis present

## 2022-06-16 DIAGNOSIS — N179 Acute kidney failure, unspecified: Principal | ICD-10-CM | POA: Diagnosis present

## 2022-06-16 DIAGNOSIS — N3289 Other specified disorders of bladder: Secondary | ICD-10-CM

## 2022-06-16 DIAGNOSIS — Z720 Tobacco use: Secondary | ICD-10-CM | POA: Insufficient documentation

## 2022-06-16 DIAGNOSIS — Z800 Family history of malignant neoplasm of digestive organs: Secondary | ICD-10-CM

## 2022-06-16 DIAGNOSIS — N189 Chronic kidney disease, unspecified: Secondary | ICD-10-CM | POA: Diagnosis present

## 2022-06-16 DIAGNOSIS — E872 Acidosis, unspecified: Secondary | ICD-10-CM | POA: Diagnosis present

## 2022-06-16 DIAGNOSIS — R001 Bradycardia, unspecified: Secondary | ICD-10-CM | POA: Diagnosis present

## 2022-06-16 DIAGNOSIS — R319 Hematuria, unspecified: Secondary | ICD-10-CM | POA: Diagnosis present

## 2022-06-16 DIAGNOSIS — J45909 Unspecified asthma, uncomplicated: Secondary | ICD-10-CM | POA: Diagnosis present

## 2022-06-16 DIAGNOSIS — E039 Hypothyroidism, unspecified: Secondary | ICD-10-CM | POA: Diagnosis present

## 2022-06-16 DIAGNOSIS — F1410 Cocaine abuse, uncomplicated: Secondary | ICD-10-CM | POA: Diagnosis present

## 2022-06-16 DIAGNOSIS — R4182 Altered mental status, unspecified: Secondary | ICD-10-CM | POA: Insufficient documentation

## 2022-06-16 DIAGNOSIS — F1910 Other psychoactive substance abuse, uncomplicated: Secondary | ICD-10-CM | POA: Insufficient documentation

## 2022-06-16 DIAGNOSIS — Z79899 Other long term (current) drug therapy: Secondary | ICD-10-CM

## 2022-06-16 DIAGNOSIS — E860 Dehydration: Secondary | ICD-10-CM | POA: Diagnosis present

## 2022-06-16 DIAGNOSIS — N1832 Chronic kidney disease, stage 3b: Secondary | ICD-10-CM | POA: Diagnosis present

## 2022-06-16 DIAGNOSIS — R7401 Elevation of levels of liver transaminase levels: Secondary | ICD-10-CM | POA: Insufficient documentation

## 2022-06-16 DIAGNOSIS — E785 Hyperlipidemia, unspecified: Secondary | ICD-10-CM | POA: Diagnosis present

## 2022-06-16 DIAGNOSIS — Z79890 Hormone replacement therapy (postmenopausal): Secondary | ICD-10-CM

## 2022-06-16 DIAGNOSIS — R410 Disorientation, unspecified: Secondary | ICD-10-CM | POA: Diagnosis present

## 2022-06-16 DIAGNOSIS — K5900 Constipation, unspecified: Secondary | ICD-10-CM | POA: Diagnosis present

## 2022-06-16 DIAGNOSIS — C3492 Malignant neoplasm of unspecified part of left bronchus or lung: Secondary | ICD-10-CM | POA: Diagnosis present

## 2022-06-16 DIAGNOSIS — E890 Postprocedural hypothyroidism: Secondary | ICD-10-CM | POA: Diagnosis present

## 2022-06-16 DIAGNOSIS — F17210 Nicotine dependence, cigarettes, uncomplicated: Secondary | ICD-10-CM | POA: Diagnosis present

## 2022-06-16 DIAGNOSIS — M6282 Rhabdomyolysis: Secondary | ICD-10-CM | POA: Diagnosis present

## 2022-06-16 DIAGNOSIS — E875 Hyperkalemia: Secondary | ICD-10-CM | POA: Diagnosis present

## 2022-06-16 LAB — CBC WITH DIFFERENTIAL/PLATELET
Abs Immature Granulocytes: 0.15 10*3/uL — ABNORMAL HIGH (ref 0.00–0.07)
Basophils Absolute: 0.1 10*3/uL (ref 0.0–0.1)
Basophils Relative: 1 %
Eosinophils Absolute: 0.1 10*3/uL (ref 0.0–0.5)
Eosinophils Relative: 1 %
HCT: 36.2 % — ABNORMAL LOW (ref 39.0–52.0)
Hemoglobin: 12.4 g/dL — ABNORMAL LOW (ref 13.0–17.0)
Immature Granulocytes: 2 %
Lymphocytes Relative: 18 %
Lymphs Abs: 1.2 10*3/uL (ref 0.7–4.0)
MCH: 32.4 pg (ref 26.0–34.0)
MCHC: 34.3 g/dL (ref 30.0–36.0)
MCV: 94.5 fL (ref 80.0–100.0)
Monocytes Absolute: 0.6 10*3/uL (ref 0.1–1.0)
Monocytes Relative: 9 %
Neutro Abs: 4.7 10*3/uL (ref 1.7–7.7)
Neutrophils Relative %: 69 %
Platelets: 263 10*3/uL (ref 150–400)
RBC: 3.83 MIL/uL — ABNORMAL LOW (ref 4.22–5.81)
RDW: 14.9 % (ref 11.5–15.5)
WBC: 6.8 10*3/uL (ref 4.0–10.5)
nRBC: 0 % (ref 0.0–0.2)

## 2022-06-16 LAB — COMPREHENSIVE METABOLIC PANEL
ALT: 93 U/L — ABNORMAL HIGH (ref 0–44)
AST: 96 U/L — ABNORMAL HIGH (ref 15–41)
Albumin: 3.4 g/dL — ABNORMAL LOW (ref 3.5–5.0)
Alkaline Phosphatase: 82 U/L (ref 38–126)
Anion gap: 12 (ref 5–15)
BUN: 109 mg/dL — ABNORMAL HIGH (ref 8–23)
CO2: 21 mmol/L — ABNORMAL LOW (ref 22–32)
Calcium: 7.6 mg/dL — ABNORMAL LOW (ref 8.9–10.3)
Chloride: 104 mmol/L (ref 98–111)
Creatinine, Ser: 8.84 mg/dL — ABNORMAL HIGH (ref 0.61–1.24)
GFR, Estimated: 6 mL/min — ABNORMAL LOW (ref >60)
Glucose, Bld: 88 mg/dL (ref 70–99)
Potassium: 5 mmol/L (ref 3.5–5.1)
Sodium: 137 mmol/L (ref 135–145)
Total Bilirubin: 0.1 mg/dL — ABNORMAL LOW (ref 0.3–1.2)
Total Protein: 7.4 g/dL (ref 6.5–8.1)

## 2022-06-16 LAB — BLOOD GAS, VENOUS
Acid-base deficit: 1.9 mmol/L (ref 0.0–2.0)
Bicarbonate: 24.3 mmol/L (ref 20.0–28.0)
Drawn by: 1528
O2 Saturation: 30 %
Patient temperature: 36.6
pCO2, Ven: 45 mmHg (ref 44–60)
pH, Ven: 7.34 (ref 7.25–7.43)
pO2, Ven: <31 mmHg — CL (ref 32–45)

## 2022-06-16 LAB — LACTIC ACID, PLASMA: Lactic Acid, Venous: 0.6 mmol/L (ref 0.5–1.9)

## 2022-06-16 LAB — ETHANOL: Alcohol, Ethyl (B): <10 mg/dL (ref <10)

## 2022-06-16 LAB — AMMONIA: Ammonia: 18 umol/L (ref 9–35)

## 2022-06-16 MED ORDER — SODIUM CHLORIDE 0.9 % IV BOLUS
1000.0000 mL | Freq: Once | INTRAVENOUS | Status: AC
  Administered 2022-06-17: 1000 mL via INTRAVENOUS

## 2022-06-16 NOTE — ED Provider Notes (Signed)
[place] EMERGENCY DEPARTMENT AT [facility] Provider Note   CSN: [id number] Arrival date & time: 06/16/22  2122     History  Chief Complaint  Patient presents with   Altered Mental Status    Miguel Colon is a 67 y.o. male.  Patient is a 67 year old male with past medical history of hypothyroidism, hyperlipidemia, small cell lung cancer.  Patient presenting today with complaints of altered mental status.  Majority of history comes from patient's neighbor who noticed he has not been outside for the past several days.  She went to check on him and found him in the house disoriented and confused.  She reports that he has short-term memory issues and is just not quite himself.  Patient denies to me he is having any pain.  He denies any fevers or chills.  He denies any symptoms.  The history is provided by the patient.       Home Medications Prior to Admission medications   Medication Sig Start Date End Date Taking? Authorizing Provider  acetaminophen (TYLENOL) 500 MG tablet Take 2 tablets (1,000 mg total) by mouth 3 (three) times daily. 09/09/20   Edsel Petrin, DO  Ensure (ENSURE) Take 237 mLs by mouth daily.    [provider]  ETOPOSIDE IV Inject into the vein every 21 ( twenty-one) days. Days 1-3 every 21 days    [provider]  gabapentin (NEURONTIN) 300 MG capsule Take 1 capsule (300 mg total) by mouth 3 (three) times daily. 06/11/21   Doreatha Massed, MD  Lactulose 20 GM/30ML SOLN Take 30 mLs (20 g total) by mouth 2 (two) times daily as needed. 09/28/21   Doreatha Massed, MD  levothyroxine (SYNTHROID) 125 MCG tablet Take 2 tablets (250 mcg total) by mouth daily before breakfast. 01/28/22   Doreatha Massed, MD  lidocaine (LIDODERM) 5 % Place 1 patch onto the skin daily. Remove & Discard patch within 12 hours or as directed by MD 09/09/20   Edsel Petrin, DO  lidocaine-prilocaine (EMLA) cream Apply to affected area  once Patient not taking: Reported on 08/06/2021 09/17/20   Artis Delay, MD  methadone (DOLOPHINE) 10 MG tablet Take 1 tablet (10 mg total) by mouth every 8 (eight) hours. 10/14/20   Carnella Guadalajara, PA-C  naloxegol oxalate (MOVANTIK) 25 MG TABS tablet Take 1 tablet (25 mg total) by mouth daily. 09/09/20   Edsel Petrin, DO  nicotine (NICODERM CQ - DOSED IN MG/24 HOURS) 14 mg/24hr patch Place 1 patch (14 mg total) onto the skin daily. 09/09/20   Edsel Petrin, DO  ondansetron (ZOFRAN) 8 MG tablet Take 1 tablet (8 mg total) by mouth 2 (two) times daily as needed. Start on the third day after cisplatin chemotherapy. Patient not taking: Reported on 08/06/2021 09/17/20   Artis Delay, MD  oxyCODONE-acetaminophen (PERCOCET/ROXICET) 5-325 MG tablet Take 1 tablet by mouth every 6 (six) hours as needed for severe pain. 05/19/22   Prosperi, Christian H, PA-C  polyethylene glycol (MIRALAX) 17 g packet Take 17 g by mouth daily. 09/17/20   Artis Delay, MD  predniSONE (STERAPRED UNI-PAK 21 TAB) 10 MG (21) TBPK tablet Take by mouth daily. Take 6 tabs by mouth daily  for 2 days, then 5 tabs for 2 days, then 4 tabs for 2 days, then 3 tabs for 2 days, 2 tabs for 2 days, then 1 tab by mouth daily for 2 days 05/19/22   Prosperi, Christian H, PA-C  prochlorperazine (COMPAZINE) 10 MG  tablet Take 1 tablet (10 mg total) by mouth every 6 (six) hours as needed (Nausea or vomiting). Patient not taking: Reported on 08/06/2021 09/17/20   Artis Delay, MD  senna (SENOKOT) 8.6 MG TABS tablet Take 2 tablets (17.2 mg total) by mouth 2 (two) times daily. 09/17/20   Artis Delay, MD  sucralfate (CARAFATE) 1 g tablet Take 1 tablet (1 g total) by mouth 4 (four) times daily -  with meals and at bedtime. Crush and dissolve in 10 mL of warm water prior to swallowing 11/05/20   Antony Blackbird, MD  traZODone (DESYREL) 50 MG tablet Take 1 tablet (50 mg total) by mouth at bedtime. 09/09/20   Edsel Petrin, DO      Allergies    Patient  has no known allergies.    Review of Systems   Review of Systems  All other systems reviewed and are negative.   Physical Exam Updated Vital Signs BP (!) 136/91   Pulse (!) 53   Temp 97.9 F (36.6 C) (Oral)   Resp 15   Ht  (1.778 m)   Wt 70.3 kg   SpO2 98%   BMI 22.24 kg/m  Physical Exam Vitals and nursing note reviewed.  Constitutional:      General: He is not in acute distress.    Appearance: He is well-developed. He is not diaphoretic.     Comments: Patient is awake and alert.  He is oriented to person and place, but not time.  HENT:     Head: Normocephalic and atraumatic.  Cardiovascular:     Rate and Rhythm: Normal rate and regular rhythm.     Heart sounds: No murmur heard.    No friction rub.  Pulmonary:     Effort: Pulmonary effort is normal. No respiratory distress.     Breath sounds: Normal breath sounds. No wheezing or rales.  Abdominal:     General: Bowel sounds are normal. There is no distension.     Palpations: Abdomen is soft.     Tenderness: There is no abdominal tenderness.  Musculoskeletal:        General: Normal range of motion.     Cervical back: Normal range of motion and neck supple.  Skin:    General: Skin is warm and dry.  Neurological:     General: No focal deficit present.     Mental Status: He is alert and oriented to person, place, and time.     Cranial Nerves: No cranial nerve deficit.     Motor: No weakness.     Coordination: Coordination normal.     ED Results / Procedures / Treatments   Labs (all labs ordered are listed, but only abnormal results are displayed) Labs Reviewed  CBC WITH DIFFERENTIAL/PLATELET - Abnormal; Notable for the following components:      Result Value   RBC 3.83 (*)    Hemoglobin 12.4 (*)    HCT 36.2 (*)    Abs Immature Granulocytes 0.15 (*)    All other components within normal limits  BLOOD GAS, VENOUS - Abnormal; Notable for the following components:   pO2, Ven <31 (*)    All other  components within normal limits  ETHANOL  AMMONIA  LACTIC ACID, PLASMA  COMPREHENSIVE METABOLIC PANEL  URINALYSIS, ROUTINE W REFLEX MICROSCOPIC  CBG MONITORING, ED    EKG EKG Interpretation  Date/Time:  Wednesday June 16 2022 21:44:57 EDT Ventricular Rate:  55 PR Interval:  138 QRS Duration: 108 QT Interval:  432 QTC Calculation: 414 R Axis:   81 Text Interpretation: Sinus rhythm Borderline right axis deviation Nonspecific T abnrm, anterolateral leads Confirmed by Geoffery Lyons ([id number]) on 06/16/2022 11:22:14 PM  Radiology DG Chest 1 View  Result Date: 06/16/2022 CLINICAL DATA:  Altered mental status. History of lung cancer. EXAM: CHEST  1 VIEW COMPARISON:  Radiograph 05/19/2022. PET CT 07/16/2021 FINDINGS: Tip of the right chest port in the SVC. Again seen hyperinflation and emphysema. The apical pulmonary nodules on prior PET CT are not well-defined by radiograph. The heart is normal in size. There is no focal airspace disease, pleural effusion, or pneumothorax. No acute osseous abnormalities are seen. IMPRESSION: 1. No acute findings. 2. Chronic hyperinflation and emphysema. Electronically Signed   By: Narda Rutherford M.D.   On: 06/16/2022 22:27    Procedures Procedures  {Document cardiac monitor, telemetry assessment procedure when appropriate:1}  Medications Ordered in ED Medications - No data to display  ED Course/ Medical Decision Making/ A&P   {   Click here for ABCD2, HEART and other calculatorsREFRESH Note before signing :1}                          Medical Decision Making Amount and/or Complexity of Data Reviewed Labs: ordered.   ***  {Document critical care time when appropriate:1} {Document review of labs and clinical decision tools ie heart score, Chads2Vasc2 etc:1}  {Document your independent review of radiology images, and any outside records:1} {Document your discussion with family members, caretakers, and with consultants:1} {Document social  determinants of health affecting pt's care:1} {Document your decision making why or why not admission, treatments were needed:1} Final Clinical Impression(s) / ED Diagnoses Final diagnoses:  None    Rx / DC Orders ED Discharge Orders     None

## 2022-06-16 NOTE — ED Triage Notes (Signed)
Pt presents to ED from home, pt lives alone, pt brought in by his neighbor, pt able to tell me his name, birthday, and able to state his neighbor's name and aware that she lives beside him, pt cannot state month, year, or president. Pt ambulatory from WR to room 12, no weakness, neglect, or speech impediment noted at this time. Neighbor says she noticed him asking the same questions over again and and difficulty remembering conversations this afternoon.

## 2022-06-16 NOTE — ED Notes (Signed)
Pt sleeping at short intervals.  Neighbor friend at bedside.

## 2022-06-17 ENCOUNTER — Observation Stay (HOSPITAL_COMMUNITY): Payer: 59

## 2022-06-17 DIAGNOSIS — R531 Weakness: Secondary | ICD-10-CM

## 2022-06-17 DIAGNOSIS — Z800 Family history of malignant neoplasm of digestive organs: Secondary | ICD-10-CM | POA: Diagnosis not present

## 2022-06-17 DIAGNOSIS — E860 Dehydration: Secondary | ICD-10-CM | POA: Diagnosis present

## 2022-06-17 DIAGNOSIS — Z8249 Family history of ischemic heart disease and other diseases of the circulatory system: Secondary | ICD-10-CM | POA: Diagnosis not present

## 2022-06-17 DIAGNOSIS — N179 Acute kidney failure, unspecified: Secondary | ICD-10-CM | POA: Diagnosis present

## 2022-06-17 DIAGNOSIS — Z85118 Personal history of other malignant neoplasm of bronchus and lung: Secondary | ICD-10-CM

## 2022-06-17 DIAGNOSIS — E039 Hypothyroidism, unspecified: Secondary | ICD-10-CM

## 2022-06-17 DIAGNOSIS — Z720 Tobacco use: Secondary | ICD-10-CM

## 2022-06-17 DIAGNOSIS — N189 Chronic kidney disease, unspecified: Secondary | ICD-10-CM | POA: Diagnosis present

## 2022-06-17 DIAGNOSIS — R410 Disorientation, unspecified: Secondary | ICD-10-CM

## 2022-06-17 DIAGNOSIS — F17210 Nicotine dependence, cigarettes, uncomplicated: Secondary | ICD-10-CM | POA: Diagnosis present

## 2022-06-17 DIAGNOSIS — R319 Hematuria, unspecified: Secondary | ICD-10-CM | POA: Diagnosis present

## 2022-06-17 DIAGNOSIS — E875 Hyperkalemia: Secondary | ICD-10-CM | POA: Diagnosis present

## 2022-06-17 DIAGNOSIS — N1832 Chronic kidney disease, stage 3b: Secondary | ICD-10-CM | POA: Diagnosis present

## 2022-06-17 DIAGNOSIS — R001 Bradycardia, unspecified: Secondary | ICD-10-CM | POA: Diagnosis present

## 2022-06-17 DIAGNOSIS — R4182 Altered mental status, unspecified: Secondary | ICD-10-CM | POA: Insufficient documentation

## 2022-06-17 DIAGNOSIS — M6282 Rhabdomyolysis: Secondary | ICD-10-CM | POA: Diagnosis present

## 2022-06-17 DIAGNOSIS — F1910 Other psychoactive substance abuse, uncomplicated: Secondary | ICD-10-CM | POA: Insufficient documentation

## 2022-06-17 DIAGNOSIS — K5900 Constipation, unspecified: Secondary | ICD-10-CM | POA: Diagnosis present

## 2022-06-17 DIAGNOSIS — D631 Anemia in chronic kidney disease: Secondary | ICD-10-CM | POA: Diagnosis present

## 2022-06-17 DIAGNOSIS — F1410 Cocaine abuse, uncomplicated: Secondary | ICD-10-CM | POA: Diagnosis present

## 2022-06-17 DIAGNOSIS — E785 Hyperlipidemia, unspecified: Secondary | ICD-10-CM | POA: Diagnosis present

## 2022-06-17 DIAGNOSIS — R7401 Elevation of levels of liver transaminase levels: Secondary | ICD-10-CM | POA: Diagnosis present

## 2022-06-17 DIAGNOSIS — C3492 Malignant neoplasm of unspecified part of left bronchus or lung: Secondary | ICD-10-CM | POA: Diagnosis present

## 2022-06-17 DIAGNOSIS — R9341 Abnormal radiologic findings on diagnostic imaging of renal pelvis, ureter, or bladder: Secondary | ICD-10-CM | POA: Diagnosis present

## 2022-06-17 DIAGNOSIS — E890 Postprocedural hypothyroidism: Secondary | ICD-10-CM | POA: Diagnosis present

## 2022-06-17 DIAGNOSIS — E872 Acidosis, unspecified: Secondary | ICD-10-CM | POA: Diagnosis present

## 2022-06-17 DIAGNOSIS — Z79899 Other long term (current) drug therapy: Secondary | ICD-10-CM | POA: Diagnosis not present

## 2022-06-17 DIAGNOSIS — R413 Other amnesia: Secondary | ICD-10-CM | POA: Diagnosis present

## 2022-06-17 DIAGNOSIS — Z79890 Hormone replacement therapy (postmenopausal): Secondary | ICD-10-CM | POA: Diagnosis not present

## 2022-06-17 DIAGNOSIS — J45909 Unspecified asthma, uncomplicated: Secondary | ICD-10-CM | POA: Diagnosis present

## 2022-06-17 LAB — COMPREHENSIVE METABOLIC PANEL
ALT: 84 U/L — ABNORMAL HIGH (ref 0–44)
AST: 80 U/L — ABNORMAL HIGH (ref 15–41)
Albumin: 3.1 g/dL — ABNORMAL LOW (ref 3.5–5.0)
Alkaline Phosphatase: 75 U/L (ref 38–126)
Anion gap: 11 (ref 5–15)
BUN: 107 mg/dL — ABNORMAL HIGH (ref 8–23)
CO2: 19 mmol/L — ABNORMAL LOW (ref 22–32)
Calcium: 7.3 mg/dL — ABNORMAL LOW (ref 8.9–10.3)
Chloride: 107 mmol/L (ref 98–111)
Creatinine, Ser: 8.51 mg/dL — ABNORMAL HIGH (ref 0.61–1.24)
GFR, Estimated: 6 mL/min — ABNORMAL LOW (ref >60)
Glucose, Bld: 93 mg/dL (ref 70–99)
Potassium: 5 mmol/L (ref 3.5–5.1)
Sodium: 137 mmol/L (ref 135–145)
Total Bilirubin: 0.4 mg/dL (ref 0.3–1.2)
Total Protein: 6.8 g/dL (ref 6.5–8.1)

## 2022-06-17 LAB — CBC
HCT: 35 % — ABNORMAL LOW (ref 39.0–52.0)
Hemoglobin: 11.8 g/dL — ABNORMAL LOW (ref 13.0–17.0)
MCH: 32.1 pg (ref 26.0–34.0)
MCHC: 33.7 g/dL (ref 30.0–36.0)
MCV: 95.1 fL (ref 80.0–100.0)
Platelets: 248 10*3/uL (ref 150–400)
RBC: 3.68 MIL/uL — ABNORMAL LOW (ref 4.22–5.81)
RDW: 14.6 % (ref 11.5–15.5)
WBC: 7.2 10*3/uL (ref 4.0–10.5)
nRBC: 0 % (ref 0.0–0.2)

## 2022-06-17 LAB — URINALYSIS, ROUTINE W REFLEX MICROSCOPIC
Bilirubin Urine: NEGATIVE
Bilirubin Urine: NEGATIVE
Glucose, UA: NEGATIVE mg/dL
Glucose, UA: NEGATIVE mg/dL
Ketones, ur: NEGATIVE mg/dL
Ketones, ur: NEGATIVE mg/dL
Leukocytes,Ua: NEGATIVE
Leukocytes,Ua: NEGATIVE
Nitrite: NEGATIVE
Nitrite: NEGATIVE
Protein, ur: NEGATIVE mg/dL
Protein, ur: NEGATIVE mg/dL
Specific Gravity, Urine: 1.01 (ref 1.005–1.030)
Specific Gravity, Urine: 1.011 (ref 1.005–1.030)
pH: 5 (ref 5.0–8.0)
pH: 5 (ref 5.0–8.0)

## 2022-06-17 LAB — MAGNESIUM: Magnesium: 2.1 mg/dL (ref 1.7–2.4)

## 2022-06-17 LAB — RAPID URINE DRUG SCREEN, HOSP PERFORMED
Amphetamines: NOT DETECTED
Barbiturates: NOT DETECTED
Benzodiazepines: NOT DETECTED
Cocaine: POSITIVE — AB
Opiates: NOT DETECTED
Tetrahydrocannabinol: NOT DETECTED

## 2022-06-17 LAB — TSH: TSH: 130.479 u[IU]/mL — ABNORMAL HIGH (ref 0.350–4.500)

## 2022-06-17 LAB — HEPATITIS PANEL, ACUTE
HCV Ab: NONREACTIVE
Hep A IgM: NONREACTIVE
Hep B C IgM: NONREACTIVE
Hepatitis B Surface Ag: NONREACTIVE

## 2022-06-17 LAB — CBG MONITORING, ED: Glucose-Capillary: 76 mg/dL (ref 70–99)

## 2022-06-17 LAB — CK: Total CK: 3685 U/L — ABNORMAL HIGH (ref 49–397)

## 2022-06-17 LAB — HIV ANTIBODY (ROUTINE TESTING W REFLEX): HIV Screen 4th Generation wRfx: NONREACTIVE

## 2022-06-17 LAB — SODIUM, URINE, RANDOM: Sodium, Ur: 51 mmol/L

## 2022-06-17 LAB — PHOSPHORUS: Phosphorus: 4.9 mg/dL — ABNORMAL HIGH (ref 2.5–4.6)

## 2022-06-17 LAB — CREATININE, URINE, RANDOM: Creatinine, Urine: 136 mg/dL

## 2022-06-17 MED ORDER — SODIUM BICARBONATE 650 MG PO TABS
1300.0000 mg | ORAL_TABLET | Freq: Two times a day (BID) | ORAL | Status: DC
  Administered 2022-06-17 – 2022-06-21 (×9): 1300 mg via ORAL
  Filled 2022-06-17 (×10): qty 2

## 2022-06-17 MED ORDER — HEPARIN SODIUM (PORCINE) 5000 UNIT/ML IJ SOLN
5000.0000 [IU] | Freq: Three times a day (TID) | INTRAMUSCULAR | Status: DC
  Administered 2022-06-17 – 2022-06-21 (×13): 5000 [IU] via SUBCUTANEOUS
  Filled 2022-06-17 (×12): qty 1

## 2022-06-17 MED ORDER — SODIUM CHLORIDE 0.9 % IV SOLN: INTRAVENOUS | Status: DC

## 2022-06-17 MED ORDER — SENNA 8.6 MG PO TABS
2.0000 | ORAL_TABLET | Freq: Two times a day (BID) | ORAL | Status: DC
  Administered 2022-06-17 – 2022-06-21 (×6): 17.2 mg via ORAL
  Filled 2022-06-17 (×8): qty 2

## 2022-06-17 MED ORDER — ACETAMINOPHEN 650 MG RE SUPP: 650.0000 mg | Freq: Four times a day (QID) | RECTAL | Status: DC | PRN

## 2022-06-17 MED ORDER — ONDANSETRON HCL 4 MG/2ML IJ SOLN: 4.0000 mg | Freq: Four times a day (QID) | INTRAMUSCULAR | Status: DC | PRN

## 2022-06-17 MED ORDER — NICOTINE 14 MG/24HR TD PT24
14.0000 mg | MEDICATED_PATCH | Freq: Every day | TRANSDERMAL | Status: DC
  Administered 2022-06-17: 14 mg via TRANSDERMAL
  Filled 2022-06-17 (×4): qty 1

## 2022-06-17 MED ORDER — SODIUM ZIRCONIUM CYCLOSILICATE 10 G PO PACK
10.0000 g | PACK | Freq: Once | ORAL | Status: AC
  Administered 2022-06-17: 10 g via ORAL
  Filled 2022-06-17: qty 1

## 2022-06-17 MED ORDER — ONDANSETRON HCL 4 MG PO TABS: 4.0000 mg | ORAL_TABLET | Freq: Four times a day (QID) | ORAL | Status: DC | PRN

## 2022-06-17 MED ORDER — LEVOTHYROXINE SODIUM 50 MCG PO TABS
250.0000 ug | ORAL_TABLET | Freq: Every day | ORAL | Status: DC
  Administered 2022-06-17 – 2022-06-21 (×5): 250 ug via ORAL
  Filled 2022-06-17: qty 2
  Filled 2022-06-17 (×2): qty 1
  Filled 2022-06-17: qty 2
  Filled 2022-06-17: qty 1

## 2022-06-17 MED ORDER — ACETAMINOPHEN 325 MG PO TABS: 650.0000 mg | ORAL_TABLET | Freq: Four times a day (QID) | ORAL | Status: DC | PRN

## 2022-06-17 MED ORDER — POLYETHYLENE GLYCOL 3350 17 G PO PACK
17.0000 g | PACK | Freq: Every day | ORAL | Status: DC
  Administered 2022-06-17 – 2022-06-18 (×2): 17 g via ORAL
  Filled 2022-06-17 (×4): qty 1

## 2022-06-17 NOTE — Progress Notes (Signed)
Patient arrived to room 325 around 0145. He is alert and oriented to self and place. Educated patient about unit and call bell. Patient is not able to answer admission questions at this time.

## 2022-06-17 NOTE — Consult Note (Signed)
Reason for Consult: AKI/CKD stage IIIa Referring Physician: Benjamine Mola, DO  Miguel Colon is an 67 y.o. male with a PMH significant for hypothyroidism, asthma, polysubstance abuse and small cell lung cancer stage IIIa s/p cisplat and etoposide and XRT in 09/2020 (lost to follow up and did not complete XRT), and CKD Stage IIIa who presented to [facility] ED on 06/16/22 after being found at home by a neighbor with AMS.  In the ED, he was noted to be confused and disoriented.  HR in the 50's, Bp 136/91, SpO2 98%.  Labs notable for Hgb 12.4, BUN 109, Scr 8.84, Co2 21, normal lactate, + cocaine on UDS, Etoh < 10, UA + blood, no protein, CXR NAD.  We were consulted due to the development of AKI/CKD stage IIIa.  The trend in Scr is seen below.    He denies any knowledge of pre-existing CKD.  He is more awake and alert today.  Oriented to person and place, knows it is 2024 but did not know the month.  He reports that he does not remember anything prior to admission other than he didn't "feel well" but could not be specific.  He denies any N/V/D, productive cough, SOB, lower extremity edema, dysuria, pyuria, hematuria, urgency, frequency, or retention.  He also denies any cocaine use or alcohol but does smoke cigarettes.  No history of nephrolithiasis.    Trend in Creatinine:  Creatinine, Ser  Date/Time Value Ref Range Status  06/17/2022 05:06 AM 8.51 (H) 0.61 - 1.24 mg/dL Final  78/46/9629 52:84 PM 8.84 (H) 0.61 - 1.24 mg/dL Final  13/24/4010 27:25 PM 1.99 (H) 0.61 - 1.24 mg/dL Final  36/64/4034 74:25 AM 2.08 (H) 0.61 - 1.24 mg/dL Final  95/63/8756 43:32 AM 1.70 (H) 0.61 - 1.24 mg/dL Final  95/18/8416 60:63 AM 1.86 (H) 0.61 - 1.24 mg/dL Final  01/60/1093 23:55 AM 1.90 (H) 0.61 - 1.24 mg/dL Final  73/22/0254 27:06 AM 1.96 (H) 0.61 - 1.24 mg/dL Final  23/76/2831 51:76 AM 2.02 (H) 0.61 - 1.24 mg/dL Final  16/08/3708 62:69 AM 2.45 (H) 0.61 - 1.24 mg/dL Final  48/54/6270 35:00 AM 1.49 (H) 0.61 - 1.24 mg/dL Final  93/81/8299  37:16 AM 1.77 (H) 0.61 - 1.24 mg/dL Final  96/78/9381 01:75 PM 1.78 (H) 0.61 - 1.24 mg/dL Final  12/23/8525 78:24 AM 1.10 0.61 - 1.24 mg/dL Final  23/53/6144 31:54 AM 1.04 0.61 - 1.24 mg/dL Final  00/86/7619 50:93 AM 0.77 0.61 - 1.24 mg/dL Final  26/71/2458 09:98 PM 0.99 0.61 - 1.24 mg/dL Final  33/82/5053 97:67 PM 1.10 0.61 - 1.24 mg/dL Final  34/19/3790 24:09 AM 0.93 0.61 - 1.24 mg/dL Final  73/53/2992 42:68 PM 1.14 0.61 - 1.24 mg/dL Final  34/19/6222 97:98 AM 1.02 0.61 - 1.24 mg/dL Final  92/12/9415 40:81 PM 0.99 0.61 - 1.24 mg/dL Final    PMH:   Past Medical History:  Diagnosis Date   Asthma    Back pain    Lung cancer    Migraines    Thyroid disease     PSH:   Past Surgical History:  Procedure Laterality Date   IR IMAGING GUIDED PORT INSERTION  09/30/2020   THYROIDECTOMY      Allergies: No Known Allergies  Medications:   Prior to Admission medications   Medication Sig Start Date End Date Taking? Authorizing Provider  oxyCODONE-acetaminophen (PERCOCET/ROXICET) 5-325 MG tablet Take 1 tablet by mouth every 6 (six) hours as needed for severe pain. 05/19/22  Yes Prosperi, Christian H, PA-C  acetaminophen (  TYLENOL) 500 MG tablet Take 2 tablets (1,000 mg total) by mouth 3 (three) times daily. 09/09/20   Edsel Petrin, DO  Ensure (ENSURE) Take 237 mLs by mouth daily.    [provider]  ETOPOSIDE IV Inject into the vein every 21 ( twenty-one) days. Days 1-3 every 21 days    [provider]  gabapentin (NEURONTIN) 300 MG capsule Take 1 capsule (300 mg total) by mouth 3 (three) times daily. Patient not taking: Reported on 06/17/2022 06/11/21   Doreatha Massed, MD  Lactulose 20 GM/30ML SOLN Take 30 mLs (20 g total) by mouth 2 (two) times daily as needed. Patient not taking: Reported on 06/17/2022 09/28/21   Doreatha Massed, MD  levothyroxine (SYNTHROID) 125 MCG tablet Take 2 tablets (250 mcg total) by mouth daily before breakfast. Patient not taking:  Reported on 06/17/2022 01/28/22   Doreatha Massed, MD  lidocaine (LIDODERM) 5 % Place 1 patch onto the skin daily. Remove & Discard patch within 12 hours or as directed by MD Patient not taking: Reported on 06/17/2022 09/09/20   Edsel Petrin, DO  lidocaine-prilocaine (EMLA) cream Apply to affected area once Patient not taking: Reported on 08/06/2021 09/17/20   Artis Delay, MD  methadone (DOLOPHINE) 10 MG tablet Take 1 tablet (10 mg total) by mouth every 8 (eight) hours. Patient not taking: Reported on 06/17/2022 10/14/20   Carnella Guadalajara, PA-C  naloxegol oxalate (MOVANTIK) 25 MG TABS tablet Take 1 tablet (25 mg total) by mouth daily. Patient not taking: Reported on 06/17/2022 09/09/20   Edsel Petrin, DO  nicotine (NICODERM CQ - DOSED IN MG/24 HOURS) 14 mg/24hr patch Place 1 patch (14 mg total) onto the skin daily. Patient not taking: Reported on 06/17/2022 09/09/20   Edsel Petrin, DO  ondansetron (ZOFRAN) 8 MG tablet Take 1 tablet (8 mg total) by mouth 2 (two) times daily as needed. Start on the third day after cisplatin chemotherapy. Patient not taking: Reported on 08/06/2021 09/17/20   Artis Delay, MD  polyethylene glycol (MIRALAX) 17 g packet Take 17 g by mouth daily. 09/17/20   Artis Delay, MD  predniSONE (STERAPRED UNI-PAK 21 TAB) 10 MG (21) TBPK tablet Take by mouth daily. Take 6 tabs by mouth daily  for 2 days, then 5 tabs for 2 days, then 4 tabs for 2 days, then 3 tabs for 2 days, 2 tabs for 2 days, then 1 tab by mouth daily for 2 days Patient not taking: Reported on 06/17/2022 05/19/22   Prosperi, Christian H, PA-C  prochlorperazine (COMPAZINE) 10 MG tablet Take 1 tablet (10 mg total) by mouth every 6 (six) hours as needed (Nausea or vomiting). Patient not taking: Reported on 08/06/2021 09/17/20   Artis Delay, MD  senna (SENOKOT) 8.6 MG TABS tablet Take 2 tablets (17.2 mg total) by mouth 2 (two) times daily. Patient not taking: Reported on 06/17/2022 09/17/20   Artis Delay,  MD  sucralfate (CARAFATE) 1 g tablet Take 1 tablet (1 g total) by mouth 4 (four) times daily -  with meals and at bedtime. Crush and dissolve in 10 mL of warm water prior to swallowing Patient not taking: Reported on 06/17/2022 11/05/20   Antony Blackbird, MD  traZODone (DESYREL) 50 MG tablet Take 1 tablet (50 mg total) by mouth at bedtime. Patient not taking: Reported on 06/17/2022 09/09/20   Edsel Petrin, DO    Inpatient medications:  heparin  5,000 Units Subcutaneous Q8H   levothyroxine  250 mcg Oral [phone]  nicotine  14 mg Transdermal Daily   polyethylene glycol  17 g Oral Daily   senna  2 tablet Oral BID    Discontinued Meds:  There are no discontinued medications.  Social History:  reports that he quit smoking about 21 months ago. His smoking use included cigarettes. He has a 23.50 pack-year smoking history. He has never used smokeless tobacco. He reports that he does not drink alcohol and does not use drugs.  Family History:   Family History  Problem Relation Age of Onset   Hypertension Mother    Hypertension Father    Colon cancer Father 67    Pertinent items are noted in HPI. Weight change:   Intake/Output Summary (Last 24 hours) at 06/17/2022 0932 Last data filed at 06/17/2022 0640 Gross per 24 hour  Intake 1838.58 ml  Output 200 ml  Net 1638.58 ml   BP 120/83 (BP Location: Left Arm)   Pulse (!) 52   Temp 98.5 F (36.9 C) (Oral)   Resp 16   Ht 5\' 9"  (1.753 m)   Wt 67 kg   SpO2 99%   BMI 21.81 kg/m  Vitals:   06/17/22 0110 06/17/22 0119 06/17/22 0145 06/17/22 0518  BP: (!) 134/90  (!) 156/93 120/83  Pulse: 71  (!) 58 (!) 52  Resp: 18  18 16   Temp:  97.9 F (36.6 C) 97.9 F (36.6 C) 98.5 F (36.9 C)  TempSrc:  Oral Oral Oral  SpO2: 100%  100% 99%  Weight:   67 kg   Height:   5\' 9"  (1.753 m)      General appearance: no distress and slowed mentation Head: Normocephalic, without obvious abnormality, atraumatic Eyes: negative findings: lids and lashes  normal, conjunctivae and sclerae normal, and corneas clear Resp: clear to auscultation bilaterally Cardio: regular rate and rhythm, S1, S2 normal, no murmur, click, rub or gallop GI: soft, non-tender; bowel sounds normal; no masses,  no organomegaly Extremities: extremities normal, atraumatic, no cyanosis or edema  Labs: Basic Metabolic Panel: Recent Labs  Lab 06/16/22 2233 06/17/22 0506  NA 137 137  K 5.0 5.0  CL 104 107  CO2 21* 19*  GLUCOSE 88 93  BUN 109* 107*  CREATININE 8.84* 8.51*  ALBUMIN 3.4* 3.1*  CALCIUM 7.6* 7.3*  PHOS  --  4.9*   Liver Function Tests: Recent Labs  Lab 06/16/22 2233 06/17/22 0506  AST 96* 80*  ALT 93* 84*  ALKPHOS 82 75  BILITOT 0.1* 0.4  PROT 7.4 6.8  ALBUMIN 3.4* 3.1*   No results for input(s): "LIPASE", "AMYLASE" in the last 168 hours. Recent Labs  Lab 06/16/22 2233  AMMONIA 18   CBC: Recent Labs  Lab 06/16/22 2233 06/17/22 0506  WBC 6.8 7.2  NEUTROABS 4.7  --   HGB 12.4* 11.8*  HCT 36.2* 35.0*  MCV 94.5 95.1  PLT 263 248   PT/INR: @LABRCNTIP (inr:5) Cardiac Enzymes: )No results for input(s): "CKTOTAL", "CKMB", "CKMBINDEX", "TROPONINI" in the last 168 hours. CBG: Recent Labs  Lab 06/17/22 0118  GLUCAP 76    Iron Studies: No results for input(s): "IRON", "TIBC", "TRANSFERRIN", "FERRITIN" in the last 168 hours.  Xrays/Other Studies: DG Chest 1 View  Result Date: 06/16/2022 CLINICAL DATA:  Altered mental status. History of lung cancer. EXAM: CHEST  1 VIEW COMPARISON:  Radiograph 05/19/2022. PET CT 07/16/2021 FINDINGS: Tip of the right chest port in the SVC. Again seen hyperinflation and emphysema. The apical pulmonary nodules on prior PET CT are not well-defined  by radiograph. The heart is normal in size. There is no focal airspace disease, pleural effusion, or pneumothorax. No acute osseous abnormalities are seen. IMPRESSION: 1. No acute findings. 2. Chronic hyperinflation and emphysema. Electronically Signed   By:  Narda Rutherford M.D.   On: 06/16/2022 22:27     Assessment/Plan:  AKI/CKD stage IIIa - likely volume depleted but also + cocaine on UDS, moderate hematuria on UA with only 6-10 RBC/HPF.  Possible rhabdomyolysis as he was confused and may have been down for some time before he was found by his neighbor.  Will check CK level and also check renal US to r/o obstruction.  Will also start workup for possible acute GN given hematuria.  Continue with IVF's for now.  No indication for dialysis at this time but will continue to follow closely.   Avoid nephrotoxic medications including NSAIDs and iodinated intravenous contrast exposure unless the latter is absolutely indicated.   Preferred narcotic agents for pain control are hydromorphone, fentanyl, and methadone. Morphine should not be used.  Avoid Baclofen and avoid oral sodium phosphate and magnesium citrate based laxatives / bowel preps.  Continue strict Input and Output monitoring.  Will monitor the patient closely with you and intervene or adjust therapy as indicated by changes in clinical status/labs   Hyperkalemia - mild, due to #1 and will dose with Lokelma x 1 and follow. Metabolic acidosis - due to #1.  Will start sodium bicarb tablets and follow.  Acute metabolic encephalopathy - improving with IVF's and time.  Further workup per primary svc.  Consider CT of head and chest given h/o small cell lung cancer without completion of treatment. Abnormal LFT's - will check hepatitis panel. Hypothyroidism - continue with home meds H/o stage IIIa small cell lung cancer - as above, consider CT of head and chest as he did not complete XRT and was lost to follow up for a year. Cocaine + - pt denies use.  Tobacco abuse - per primary svc.   Julien Nordmann Raheem Kolbe 06/17/2022, 9:32 AM

## 2022-06-17 NOTE — Progress Notes (Signed)
  Transition of Care [facility]) Screening Note   Patient Details  Name: Arvester Brandow Date of Birth: 08-14-1955   Transition of Care [facility]) CM/SW Contact:    Annice Needy, LCSW Phone Number: 06/17/2022, 11:48 AM    Transition of Care Department [facility]) has reviewed patient and no TOC needs have been identified at this time. We will continue to monitor patient advancement through interdisciplinary progression rounds. If new patient transition needs arise, please place a TOC consult.

## 2022-06-17 NOTE — H&P (Addendum)
History and Physical    Patient: Miguel Colon ZOX:[id number] DOB: 1955-12-23 DOA: 06/16/2022 DOS: the patient was seen and examined on 06/17/2022 PCP: [name], FNP  Patient coming from: Home  Chief Complaint:  Chief Complaint  Patient presents with   Altered Mental Status   HPI: Miguel Colon is a 67 y.o. male with medical history significant of hypothyroidism, small lung cancer who presents emergency department due to altered mental status.  Patient complained of 2 to 3-day onset of generalized weakness and fatigue, was unable to provide more history.  Rest of the history was obtained from ED physician and ED medical record.  Per report, patient's neighbor went to check on him since he was not seen outside his house for a few days, he was noted to be confused and disoriented.  Patient was reported to have short-term memory problems.  He denies fever, chills, chest pain, shortness of breath, nausea, vomiting or abdominal pain.  ED Course:  In the emergency department, he was bradycardic with HR in the 50s, but other vital signs were within normal range.  Workup in the ED showed normocytic anemia, BMP showed normal CBC except for BUN/creatinine of 109/8.84 (baseline creatinine at 1.9-2.1), bicarb 21.  Lactic acid was normal, alcohol level was less than 10, ammonia was 18, urinalysis was normal, urine drug screen was positive for cocaine. Chest x-ray showed no acute findings IV hydration was provided Nephrologist (Dr. Kathrene Bongo) was consulted and recommended to continue with IV hydration and monitor creatinine level per EDP.  Hospitalist was asked to admit patient for further evaluation and management.  Review of Systems: Review of systems as noted in the HPI. All other systems reviewed and are negative.   Past Medical History:  Diagnosis Date   Asthma    Back pain    Lung cancer    Migraines    Thyroid disease    Past Surgical History:  Procedure Laterality Date   IR IMAGING  GUIDED PORT INSERTION  09/30/2020   THYROIDECTOMY      Social History:  reports that he quit smoking about 21 months ago. His smoking use included cigarettes. He has a 23.50 pack-year smoking history. He has never used smokeless tobacco. He reports that he does not drink alcohol and does not use drugs.   No Known Allergies  Family History  Problem Relation Age of Onset   Hypertension Mother    Hypertension Father    Colon cancer Father 71     Prior to Admission medications   Medication Sig Start Date End Date Taking? Authorizing Provider  acetaminophen (TYLENOL) 500 MG tablet Take 2 tablets (1,000 mg total) by mouth 3 (three) times daily. 09/09/20   Edsel Petrin, DO  Ensure (ENSURE) Take 237 mLs by mouth daily.    [provider]  ETOPOSIDE IV Inject into the vein every 21 ( twenty-one) days. Days 1-3 every 21 days    [provider]  gabapentin (NEURONTIN) 300 MG capsule Take 1 capsule (300 mg total) by mouth 3 (three) times daily. 06/11/21   Doreatha Massed, MD  Lactulose 20 GM/30ML SOLN Take 30 mLs (20 g total) by mouth 2 (two) times daily as needed. 09/28/21   Doreatha Massed, MD  levothyroxine (SYNTHROID) 125 MCG tablet Take 2 tablets (250 mcg total) by mouth daily before breakfast. 01/28/22   Doreatha Massed, MD  lidocaine (LIDODERM) 5 % Place 1 patch onto the skin daily. Remove & Discard patch within 12 hours or as directed  by MD 09/09/20   Edsel Petrin, DO  lidocaine-prilocaine (EMLA) cream Apply to affected area once Patient not taking: Reported on 08/06/2021 09/17/20   Artis Delay, MD  methadone (DOLOPHINE) 10 MG tablet Take 1 tablet (10 mg total) by mouth every 8 (eight) hours. 10/14/20   Carnella Guadalajara, PA-C  naloxegol oxalate (MOVANTIK) 25 MG TABS tablet Take 1 tablet (25 mg total) by mouth daily. 09/09/20   Edsel Petrin, DO  nicotine (NICODERM CQ - DOSED IN MG/24 HOURS) 14 mg/24hr patch Place 1 patch (14 mg total) onto  the skin daily. 09/09/20   Edsel Petrin, DO  ondansetron (ZOFRAN) 8 MG tablet Take 1 tablet (8 mg total) by mouth 2 (two) times daily as needed. Start on the third day after cisplatin chemotherapy. Patient not taking: Reported on 08/06/2021 09/17/20   Artis Delay, MD  oxyCODONE-acetaminophen (PERCOCET/ROXICET) 5-325 MG tablet Take 1 tablet by mouth every 6 (six) hours as needed for severe pain. 05/19/22   Prosperi, Christian H, PA-C  polyethylene glycol (MIRALAX) 17 g packet Take 17 g by mouth daily. 09/17/20   Artis Delay, MD  predniSONE (STERAPRED UNI-PAK 21 TAB) 10 MG (21) TBPK tablet Take by mouth daily. Take 6 tabs by mouth daily  for 2 days, then 5 tabs for 2 days, then 4 tabs for 2 days, then 3 tabs for 2 days, 2 tabs for 2 days, then 1 tab by mouth daily for 2 days 05/19/22   Prosperi, Christian H, PA-C  prochlorperazine (COMPAZINE) 10 MG tablet Take 1 tablet (10 mg total) by mouth every 6 (six) hours as needed (Nausea or vomiting). Patient not taking: Reported on 08/06/2021 09/17/20   Artis Delay, MD  senna (SENOKOT) 8.6 MG TABS tablet Take 2 tablets (17.2 mg total) by mouth 2 (two) times daily. 09/17/20   Artis Delay, MD  sucralfate (CARAFATE) 1 g tablet Take 1 tablet (1 g total) by mouth 4 (four) times daily -  with meals and at bedtime. Crush and dissolve in 10 mL of warm water prior to swallowing 11/05/20   Antony Blackbird, MD  traZODone (DESYREL) 50 MG tablet Take 1 tablet (50 mg total) by mouth at bedtime. 09/09/20   Edsel Petrin, DO    Physical Exam: BP (!) 156/93 (BP Location: Left Arm)   Pulse (!) 58   Temp 97.9 F (36.6 C) (Oral)   Resp 18   Ht 5\' 10"  (1.778 m)   Wt 70.3 kg   SpO2 100%   BMI 22.24 kg/m   General: 67 y.o. year-old male well developed well nourished in no acute distress.   HEENT: NCAT, EOMI, dry mucous membrane Neck: Supple, trachea medial Cardiovascular: Regular rate and rhythm with no rubs or gallops.  No thyromegaly or JVD noted.  No lower extremity  edema. 2/4 pulses in all 4 extremities. Respiratory: Clear to auscultation with no wheezes or rales. Good inspiratory effort. Abdomen: Soft, nontender nondistended with normal bowel sounds x4 quadrants. Muskuloskeletal: No cyanosis, clubbing or edema noted bilaterally Neuro: CN II-XII intact, strength 5/5 x 4, sensation, reflexes intact, alert and oriented to person and place only Skin: No ulcerative lesions noted or rashes Psychiatry: Mood is appropriate for condition and setting          Labs on Admission:  Basic Metabolic Panel: Recent Labs  Lab 06/16/22 2233  NA 137  K 5.0  CL 104  CO2 21*  GLUCOSE 88  BUN 109*  CREATININE 8.84*  CALCIUM 7.6*  Liver Function Tests: Recent Labs  Lab 06/16/22 2233  AST 96*  ALT 93*  ALKPHOS 82  BILITOT 0.1*  PROT 7.4  ALBUMIN 3.4*   No results for input(s): "LIPASE", "AMYLASE" in the last 168 hours. Recent Labs  Lab 06/16/22 2233  AMMONIA 18   CBC: Recent Labs  Lab 06/16/22 2233  WBC 6.8  NEUTROABS 4.7  HGB 12.4*  HCT 36.2*  MCV 94.5  PLT 263   Cardiac Enzymes: No results for input(s): "CKTOTAL", "CKMB", "CKMBINDEX", "TROPONINI" in the last 168 hours.  BNP (last 3 results) No results for input(s): "BNP" in the last 8760 hours.  ProBNP (last 3 results) No results for input(s): "PROBNP" in the last 8760 hours.  CBG: Recent Labs  Lab 06/17/22 0118  GLUCAP 76    Radiological Exams on Admission: DG Chest 1 View  Result Date: 06/16/2022 CLINICAL DATA:  Altered mental status. History of lung cancer. EXAM: CHEST  1 VIEW COMPARISON:  Radiograph 05/19/2022. PET CT 07/16/2021 FINDINGS: Tip of the right chest port in the SVC. Again seen hyperinflation and emphysema. The apical pulmonary nodules on prior PET CT are not well-defined by radiograph. The heart is normal in size. There is no focal airspace disease, pleural effusion, or pneumothorax. No acute osseous abnormalities are seen. IMPRESSION: 1. No acute findings. 2.  Chronic hyperinflation and emphysema. Electronically Signed   By: Narda Rutherford M.D.   On: 06/16/2022 22:27    EKG: I independently viewed the EKG done and my findings are as followed:    Assessment/Plan Present on Admission:  Acute kidney injury superimposed on CKD  Acquired hypothyroidism  Small cell lung cancer, left  Constipation  Principal Problem:   Acute kidney injury superimposed on CKD Active Problems:   Acquired hypothyroidism   Constipation   Small cell lung cancer, left   Substance abuse   Transaminitis   Dehydration   Tobacco abuse   Altered mental status  Acute kidney injury superimposed on CKD BUN/creatinine of 109/8.84 (baseline creatinine at 1.9-2.1) Continue gentle hydration Renally adjust medications, avoid nephrotoxic agents/dehydration/hypotension Consider nephrology consult if no improvement in creatinine level with hydration  Dehydration Continue IV hydration  Altered mental status, generalized weakness and fatigue secondary to above (AKI and dehydration) Continue fall precaution Protein supplement will be provided  Transaminitis AST 96, ALT 93 Continue to monitor liver enzymes  Substance abuse Urine drug screen was positive for cocaine Patient was counseled on cocaine abuse cessation  Hypothyroidism Continue Synthroid  Constipation Continue MiraLAX, senna  History of lung cancer Stage IIIa (T4 N0 M0) small cell lung cancer  Patient follows with Dr. Ellin Saba  Tobacco abuse Patient was counseled on tobacco abuse cessation   DVT prophylaxis: Heparin subcu  Code Status: Full code  Consults: None  Family Communication: None at bedside  Severity of Illness: The appropriate patient status for this patient is OBSERVATION. Observation status is judged to be reasonable and necessary in order to provide the required intensity of service to ensure the patient's safety. The patient's presenting symptoms, physical exam findings, and  initial radiographic and laboratory data in the context of their medical condition is felt to place them at decreased risk for further clinical deterioration. Furthermore, it is anticipated that the patient will be medically stable for discharge from the hospital within 2 midnights of admission.   Author: Frankey Shown, DO 06/17/2022 2:33 AM  For on call review [email or web address].

## 2022-06-17 NOTE — ED Notes (Signed)
Pt given water per provider.

## 2022-06-17 NOTE — Progress Notes (Signed)
Patient admitted after midnight, please see H&P.  Here with: Acute kidney injury superimposed on CKD BUN/creatinine of 109/8.84 (baseline creatinine at 2) Continue gentle hydration Renally adjust medications, avoid nephrotoxic agents/dehydration/hypotension -consult nephrology    Altered mental status, generalized weakness and fatigue secondary to above (AKI and dehydration) Continue fall precaution    Transaminitis AST 96, ALT 93 Continue to monitor liver enzymes   Substance abuse Urine drug screen was positive for cocaine Patient was counseled on cocaine abuse cessation   Hypothyroidism Continue Synthroid -check TSH-- known non-compliance   Constipation Continue MiraLAX, senna   History of lung cancer Stage IIIa (T4 N0 M0) small cell lung cancer  Patient followed with Dr. Ellin Saba but appears to have been lost to follow up again-- last seen 6/23   Tobacco abuse Patient was counseled on tobacco abuse cessation   Marlin Canary DO

## 2022-06-18 DIAGNOSIS — N189 Chronic kidney disease, unspecified: Secondary | ICD-10-CM | POA: Diagnosis not present

## 2022-06-18 DIAGNOSIS — N179 Acute kidney failure, unspecified: Secondary | ICD-10-CM | POA: Diagnosis not present

## 2022-06-18 LAB — COMPREHENSIVE METABOLIC PANEL
ALT: 62 U/L — ABNORMAL HIGH (ref 0–44)
AST: 53 U/L — ABNORMAL HIGH (ref 15–41)
Albumin: 2.9 g/dL — ABNORMAL LOW (ref 3.5–5.0)
Alkaline Phosphatase: 63 U/L (ref 38–126)
Anion gap: 11 (ref 5–15)
BUN: 95 mg/dL — ABNORMAL HIGH (ref 8–23)
CO2: 19 mmol/L — ABNORMAL LOW (ref 22–32)
Calcium: 7 mg/dL — ABNORMAL LOW (ref 8.9–10.3)
Chloride: 109 mmol/L (ref 98–111)
Creatinine, Ser: 7.36 mg/dL — ABNORMAL HIGH (ref 0.61–1.24)
GFR, Estimated: 8 mL/min — ABNORMAL LOW (ref >60)
Glucose, Bld: 83 mg/dL (ref 70–99)
Potassium: 5.1 mmol/L (ref 3.5–5.1)
Sodium: 139 mmol/L (ref 135–145)
Total Bilirubin: 0.3 mg/dL (ref 0.3–1.2)
Total Protein: 6.4 g/dL — ABNORMAL LOW (ref 6.5–8.1)

## 2022-06-18 LAB — CBC
HCT: 34.7 % — ABNORMAL LOW (ref 39.0–52.0)
Hemoglobin: 11.6 g/dL — ABNORMAL LOW (ref 13.0–17.0)
MCH: 32 pg (ref 26.0–34.0)
MCHC: 33.4 g/dL (ref 30.0–36.0)
MCV: 95.6 fL (ref 80.0–100.0)
Platelets: 253 10*3/uL (ref 150–400)
RBC: 3.63 MIL/uL — ABNORMAL LOW (ref 4.22–5.81)
RDW: 15 % (ref 11.5–15.5)
WBC: 6.8 10*3/uL (ref 4.0–10.5)
nRBC: 0 % (ref 0.0–0.2)

## 2022-06-18 LAB — ANA W/REFLEX IF POSITIVE: Anti Nuclear Antibody (ANA): NEGATIVE

## 2022-06-18 LAB — PROTEIN / CREATININE RATIO, URINE
Creatinine, Urine: 81 mg/dL
Total Protein, Urine: <6.5 mg/dL

## 2022-06-18 LAB — ANCA TITERS
Atypical P-ANCA titer: <1:20 {titer}
C-ANCA: <1:20 {titer}
P-ANCA: <1:20 {titer}

## 2022-06-18 NOTE — Care Management Important Message (Signed)
Important Message  Patient Details  Name: Miguel Colon MRN: [id number] Date of Birth: 1955-08-11   Medicare Important Message Given:  Yes     Corey Harold 06/18/2022, 11:04 AM

## 2022-06-18 NOTE — Progress Notes (Signed)
Patient urine sent to lab. No bladder scanner available on unit or ICU at this time, out for repair.

## 2022-06-18 NOTE — Progress Notes (Signed)
[facility] Progress Note  Name: Miguel Colon MRN: [id number] DOB: 11-27-1955  Chief Complaint:  Found down altered at home   Subjective:  He had 250 mL UOP over 4/18 charted.  He has been on NS at 75 ml/hr.   He denies NSAID use at home.  He denies any difficulty with urination; no hesitancy and good stream.  Spoke with primary team and they are consulting urology.    Review of systems:  Denies shortness of breath or chest pain  Denies nausea/vomiting  No cramping or dizziness  ---------- Background on consult:    Miguel Colon is an 67 y.o. 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  In the ED, he was noted to be confused and disoriented.  HR in the 50's, Bp 136/91, SpO2 98%.  Labs notable for Hgb 12.4, BUN 109, Scr 8.84, Co2 21, normal lactate, + cocaine on UDS, Etoh < 10, UA + blood, no protein, CXR NAD.  We were consulted due to the development of AKI/CKD stage IIIa.  The trend in Scr is seen below.     He denies any knowledge of pre-existing CKD.  He is more awake and alert today.  Oriented to person and place, knows it is 2024 but did not know the month.  He reports that he does not remember anything prior to admission other than he didn't "feel well" but could not be specific.  He denies any N/V/D, productive cough, SOB, lower extremity edema, dysuria, pyuria, hematuria, urgency, frequency, or retention.  He also denies any cocaine use or alcohol but does smoke cigarettes.  No history of nephrolithiasis.       Intake/Output Summary (Last 24 hours) at 06/18/2022 1109 Last data filed at 06/17/2022 1817 Gross per 24 hour  Intake 480 ml  Output 250 ml  Net 230 ml    Vitals:  Vitals:   06/17/22 0518 06/17/22  1318 06/17/22 1930 06/18/22 0521  BP: 120/83 112/78 113/79 130/85  Pulse: (!) 52 62 60 66  Resp: Temp: 98.5 F (36.9 C) 97.7 F (36.5 C) 98.4 F (36.9 C)   TempSrc: Oral Oral    SpO2: 99% 98% 98% 98%  Weight:      Height:         Physical Exam:  General adult male in bed in no acute distress HEENT normocephalic atraumatic extraocular movements intact sclera anicteric Neck supple trachea midline Lungs clear to auscultation bilaterally normal work of breathing at rest  Heart regular rate and rhythm no rubs or gallops appreciated Abdomen soft nontender nondistended Extremities no edema  Psych normal mood and affect Neuro alert and oriented to person, location.  Guessed the year as 2023 then couldn't come up with month.   GU no foley; has urinal at bedside    Medications reviewed   Labs:     Latest Ref Rng & Units 06/18/2022    4:25 AM 06/17/2022    5:06 AM 06/16/2022   10:33 PM  BMP  Glucose 70 - 99 mg/dL 83  93  88   BUN 8 - 23 mg/dL 95  409  811   Creatinine 0.61 - 1.24 mg/dL 9.14  7.82  9.56  Sodium 135 - 145 mmol/L 139  137  137   Potassium 3.5 - 5.1 mmol/L 5.1  5.0  5.0   Chloride 98 - 111 mmol/L 109  107  104   CO2 22 - 32 mmol/L Calcium 8.9 - 10.3 mg/dL 7.0  7.3  7.6      Assessment/Plan:     AKI - likely volume depleted but also + cocaine on UDS, moderate hematuria on UA with only 6-10 RBC/HPF.  Mild rhabdomyolysis as he was confused and may have been down for some time before he was found by his neighbor.  CK was 3685.  Note GN work-up was initiated as below.  Urine protein was negative.  His renal US noted echogenic kidneys c/w with chronic medical renal disease but note nodularity concerning for possible cancer as below Check post-void residual bladder scan once and in/out cath if over 300 mL urine retained GN work-up in process given hematuria.  ANCA pending. ANA negative. Complement pending.  Continue with normal saline for now.     No indication for dialysis at this time but will continue to follow closely.  Improving slowly with supportive care I have changed him to a renal diet  Check urine protein/cr ratio  Strict ins/outs   Bladder wall thickening and nodularity - as above check post void residual bladder scan.  Note concern for possible neoplastic etiology.  Recommend urology referral.  Primary team has consulted urology.  CKD stage 3b - baseline Cr 2.  Hyperkalemia - mild, s/p lokelma once.  As above, I have changed to a renal diet.  Can redose lokelma if needed over the weekend  Metabolic acidosis - due to #1.  Will continue sodium bicarb tablets  Acute metabolic encephalopathy - improving with IVF's and time.  Further workup per primary svc.  Consider CT of head and chest given h/o small cell lung cancer without completion of treatment.  Abnormal LFT's - transaminitis improving.  hepatitis panel was nonreactive. Hypothyroidism - per primary team.  Note TSH is markedly elevated.  Concern for noncompliance  H/o stage IIIa small cell lung cancer - as above, consider CT of head and chest as he did not complete XRT and was lost to follow up for a year. Cocaine + - pt denies use per charting.  Tobacco abuse - per primary svc.  Disposition - continue inpatient monitoring.  Nephrology will follow his labs and chart over the weekend.  Please do not hesitate to reach out with questions   Estanislado Emms, MD 06/18/2022 11:44 AM

## 2022-06-18 NOTE — Progress Notes (Signed)
PROGRESS NOTE    Miguel Colon  RUE:[id number] DOB: 05-18-1955 DOA: 06/16/2022 PCP: [name], FNP    Brief Narrative:   Miguel Colon is a 67 y.o. male with medical history significant of hypothyroidism, small lung cancer who presents emergency department due to altered mental status.  Patient complained of 2 to 3-day onset of generalized weakness and fatigue, was unable to provide more history.  Rest of the history was obtained from ED physician and ED medical record.  Per report, patient's neighbor went to check on him since he was not seen outside his house for a few days, he was noted to be confused and disoriented.  Patient was reported to have short-term memory problems.  He denies fever, chills, chest pain, shortness of breath, nausea, vomiting or abdominal pain.    Assessment and Plan:   Acute kidney injury superimposed on CKD BUN/creatinine of 109/8.84 (baseline creatinine at 2) Continue gentle hydration Renally adjust medications, avoid nephrotoxic agents/dehydration/hypotension -appreciate nephrology -PVR   Abnormal renal U/S -Urology consult for possible cystoscopy    Altered mental status, generalized weakness and fatigue secondary to above (AKI and dehydration) Continue fall precaution  -? 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 Ellin Saba but appears to have been lost to follow up again-- last seen 6/23- at that time: Recommend repeat imaging of the CT of the chest with contrast in 2 months.    Tobacco abuse Patient was counseled on tobacco abuse cessation   DVT prophylaxis: heparin  injection 5,000 Units Start: 06/17/22 0600 SCDs Start: 06/17/22 0212    Code Status: Full Code Family Communication: called neice  Disposition Plan:  Level of care: Med-Surg Status is: Inpatient Remains inpatient appropriate because: needs IVF    Consultants:  renal   Subjective: No overnight events  Objective: Vitals:   06/17/22 0518 06/17/22 1318 06/17/22 1930 06/18/22 0521  BP: 120/83 112/78 113/79 130/85  Pulse: (!) 52 62 60 66  Resp: Temp: 98.5 F (36.9 C) 97.7 F (36.5 C) 98.4 F (36.9 C)   TempSrc: Oral Oral    SpO2: 99% 98% 98% 98%  Weight:      Height:        Intake/Output Summary (Last 24 hours) at 06/18/2022 1134 Last data filed at 06/17/2022 1817 Gross per 24 hour  Intake 480 ml  Output 250 ml  Net 230 ml   Filed Weights   06/16/22 2145 06/17/22 0145  Weight: 70.3 kg 67 kg    Examination:   General: Appearance:    Well developed, well nourished male in no acute distress     Lungs:     Clear to auscultation bilaterally, respirations unlabored  Heart:    Normal heart rate. Normal rhythm. No murmurs, rubs, or gallops.    MS:   All extremities are intact.    Neurologic:   Awake, alert       Data Reviewed: I have personally reviewed following labs and imaging studies  CBC: Recent Labs  Lab 06/16/22 2233 06/17/22 0506 06/18/22 0425  WBC 6.8 7.2 6.8  NEUTROABS 4.7  --   --  HGB 12.4* 11.8* 11.6*  HCT 36.2* 35.0* 34.7*  MCV 94.5 95.1 95.6  PLT 263 248 253   Basic Metabolic Panel: Recent Labs  Lab 06/16/22 2233 06/17/22 0506 06/18/22 0425  NA 137 137 139  K 5.0 5.0 5.1  CL 104 107 109  CO2 21* 19* 19*  GLUCOSE 88 93 83  BUN 109* 107* 95*  CREATININE 8.84* 8.51* 7.36*  CALCIUM 7.6* 7.3* 7.0*  MG  --  2.1  --   PHOS  --  4.9*  --    GFR: Estimated Creatinine Clearance: 9.5 mL/min (A) (by C-G formula based on SCr of 7.36 mg/dL (H)). Liver Function Tests: Recent Labs  Lab 06/16/22 2233 06/17/22 0506  06/18/22 0425  AST 96* 80* 53*  ALT 93* 84* 62*  ALKPHOS 82 75 63  BILITOT 0.1* 0.4 0.3  PROT 7.4 6.8 6.4*  ALBUMIN 3.4* 3.1* 2.9*   No results for input(s): "LIPASE", "AMYLASE" in the last 168 hours. Recent Labs  Lab 06/16/22 2233  AMMONIA 18   Coagulation Profile: No results for input(s): "INR", "PROTIME" in the last 168 hours. Cardiac Enzymes: Recent Labs  Lab 06/17/22 1005  CKTOTAL 3,685*   BNP (last 3 results) No results for input(s): "PROBNP" in the last 8760 hours. HbA1C: No results for input(s): "HGBA1C" in the last 72 hours. CBG: Recent Labs  Lab 06/17/22 0118  GLUCAP 76   Lipid Profile: No results for input(s): "CHOL", "HDL", "LDLCALC", "TRIG", "CHOLHDL", "LDLDIRECT" in the last 72 hours. Thyroid Function Tests: Recent Labs    06/17/22 1005  TSH 130.479*   Anemia Panel: No results for input(s): "VITAMINB12", "FOLATE", "FERRITIN", "TIBC", "IRON", "RETICCTPCT" in the last 72 hours. Sepsis Labs: Recent Labs  Lab 06/16/22 2233  LATICACIDVEN 0.6    No results found for this or any previous visit (from the past 240 hour(s)).       Radiology Studies: US RENAL  Result Date: 06/17/2022 CLINICAL DATA:  [id number] AKI (acute kidney injury) [id number] EXAM: RENAL / URINARY TRACT ULTRASOUND COMPLETE COMPARISON:  None Available. FINDINGS: The right kidney measured 10.2 cm and the left kidney measured 10.4 cm. The kidneys demonstrate increased echogenicity consistent with chronic medical renal disease. No renal parenchymal lesions are identified. No shadowing stones are seen. No hydronephrosis identified. Urinary bladder wall thickening and focal nodularity raises concern for possible neoplastic process. Cystoscopic correlation should be considered. Bladder volumes were not measured. IMPRESSION: 1. Echogenic kidneys consistent with chronic medical renal disease. 2. Urinary bladder wall thickening and focal nodularity concerning for possible neoplastic etiology.  Correlation with cystoscopy should be considered. Electronically Signed   By: Layla Maw M.D.   On: 06/17/2022 15:29   DG Chest 1 View  Result Date: 06/16/2022 CLINICAL DATA:  Altered mental status. History of lung cancer. EXAM: CHEST  1 VIEW COMPARISON:  Radiograph 05/19/2022. PET CT 07/16/2021 FINDINGS: Tip of the right chest port in the SVC. Again seen hyperinflation and emphysema. The apical pulmonary nodules on prior PET CT are not well-defined by radiograph. The heart is normal in size. There is no focal airspace disease, pleural effusion, or pneumothorax. No acute osseous abnormalities are seen. IMPRESSION: 1. No acute findings. 2. Chronic hyperinflation and emphysema. Electronically Signed   By: Narda Rutherford M.D.   On: 06/16/2022 22:27        Scheduled Meds:  heparin  5,000 Units Subcutaneous Q8H   levothyroxine  250 mcg Oral Q0600   nicotine  14 mg Transdermal Daily   polyethylene  glycol  17 g Oral Daily   senna  2 tablet Oral BID   sodium bicarbonate  1,300 mg Oral BID   Continuous Infusions:  sodium chloride 75 mL/hr at 06/18/22 0820     LOS: 1 day    Time spent: 45 minutes spent on chart review, discussion with nursing staff, consultants, updating family and interview/physical exam; more than 50% of that time was spent in counseling and/or coordination of care.    Joseph Art, DO [facility] Available via Epic secure chat 7am-7pm After these hours, please refer to coverage provider listed on [email or web address] 06/18/2022, 11:34 AM

## 2022-06-19 DIAGNOSIS — N179 Acute kidney failure, unspecified: Secondary | ICD-10-CM | POA: Diagnosis not present

## 2022-06-19 DIAGNOSIS — N189 Chronic kidney disease, unspecified: Secondary | ICD-10-CM | POA: Diagnosis not present

## 2022-06-19 LAB — COMPREHENSIVE METABOLIC PANEL
ALT: 51 U/L — ABNORMAL HIGH (ref 0–44)
AST: 38 U/L (ref 15–41)
Albumin: 2.8 g/dL — ABNORMAL LOW (ref 3.5–5.0)
Alkaline Phosphatase: 56 U/L (ref 38–126)
Anion gap: 9 (ref 5–15)
BUN: 83 mg/dL — ABNORMAL HIGH (ref 8–23)
CO2: 21 mmol/L — ABNORMAL LOW (ref 22–32)
Calcium: 6.9 mg/dL — ABNORMAL LOW (ref 8.9–10.3)
Chloride: 109 mmol/L (ref 98–111)
Creatinine, Ser: 6 mg/dL — ABNORMAL HIGH (ref 0.61–1.24)
GFR, Estimated: 10 mL/min — ABNORMAL LOW (ref >60)
Glucose, Bld: 92 mg/dL (ref 70–99)
Potassium: 5 mmol/L (ref 3.5–5.1)
Sodium: 139 mmol/L (ref 135–145)
Total Bilirubin: 0.6 mg/dL (ref 0.3–1.2)
Total Protein: 6.4 g/dL — ABNORMAL LOW (ref 6.5–8.1)

## 2022-06-19 LAB — COMPLEMENT, TOTAL: Compl, Total (CH50): >60 U/mL (ref >41)

## 2022-06-19 LAB — PHOSPHORUS: Phosphorus: 5 mg/dL — ABNORMAL HIGH (ref 2.5–4.6)

## 2022-06-19 NOTE — Progress Notes (Signed)
Labs/chart reviewed. Renal function continues to improve. C/w ivf provided volume status can tolerate.  Anthony Sar, MD [facility]

## 2022-06-19 NOTE — Progress Notes (Signed)
PROGRESS NOTE    Miguel Colon  ZOX:[id number] DOB: 09-23-1955 DOA: 06/16/2022 PCP: [name], FNP    Brief Narrative:   Miguel Colon is a 67 y.o. male with medical history significant of hypothyroidism, small lung cancer who presents emergency department due to altered mental status.  Patient complained of 2 to 3-day onset of generalized weakness and fatigue, was unable to provide more history.  Rest of the history was obtained from ED physician and ED medical record.  Per report, patient's neighbor went to check on him since he was not seen outside his house for a few days, he was noted to be confused and disoriented.  Patient was reported to have short-term memory problems.  He denies fever, chills, chest pain, shortness of breath, nausea, vomiting or abdominal pain.    Assessment and Plan:   Acute kidney injury superimposed on CKD BUN/creatinine of 109/8.84 (baseline creatinine at 2) Continue gentle hydration Renally adjust medications, avoid nephrotoxic agents/dehydration/hypotension -appreciate nephrology -PVR not able to be done due to no machine   Abnormal renal U/S -Urology consult for possible cystoscopy - await consult    Altered mental status, generalized weakness and fatigue secondary to above (AKI and dehydration) Continue fall precaution  -? 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 Ellin Saba but appears to have been lost to follow up again-- last seen 6/23- at that time: Recommend repeat imaging of the CT of the chest with contrast in 2 months.    Tobacco abuse Patient was counseled on  tobacco abuse cessation   DVT prophylaxis: heparin injection 5,000 Units Start: 06/17/22 0600 SCDs Start: 06/17/22 8119    Code Status: Full Code Family Communication: called niece 4/19  Disposition Plan:  Level of care: Med-Surg Status is: Inpatient Remains inpatient appropriate because: needs IVF    Consultants:  renal   Subjective: No SOB, says he is making urine  Objective: Vitals:   06/18/22 0521 06/18/22 1250 06/18/22 1952 06/19/22 0435  BP: 130/85 125/87 113/70 110/89  Pulse: 66 65 (!) 102 87  Resp: 16 20 16 18   Temp:  97.8 F (36.6 C) 98.1 F (36.7 C) 97.9 F (36.6 C)  TempSrc:  Oral Oral Oral  SpO2: 98% 92% 100% 100%  Weight:      Height:        Intake/Output Summary (Last 24 hours) at 06/19/2022 1114 Last data filed at 06/19/2022 0436 Gross per 24 hour  Intake 2041.48 ml  Output 325 ml  Net 1716.48 ml   Filed Weights   06/16/22 2145 06/17/22 0145  Weight: 70.3 kg 67 kg    Examination:   General: Appearance:    Well developed, well nourished male in no acute distress     Lungs:     Clear to auscultation bilaterally, respirations unlabored  Heart:    Normal heart rate. Normal rhythm. No murmurs, rubs, or gallops.    MS:   All extremities are intact.    Neurologic:   Awake, alert       Data Reviewed: I have personally reviewed following labs and imaging studies  CBC: Recent Labs  Lab 06/16/22 2233  06/17/22 0506 06/18/22 0425  WBC 6.8 7.2 6.8  NEUTROABS 4.7  --   --   HGB 12.4* 11.8* 11.6*  HCT 36.2* 35.0* 34.7*  MCV 94.5 95.1 95.6  PLT 263 248 253   Basic Metabolic Panel: Recent Labs  Lab 06/16/22 2233 06/17/22 0506 06/18/22 0425 06/19/22 0618  NA 137 137 139 139  K 5.0 5.0 5.1 5.0  CL 104 107 109 109  CO2 21* 19* 19* 21*  GLUCOSE 88 93 83 92  BUN 109* 107* 95* 83*  CREATININE 8.84* 8.51* 7.36* 6.00*  CALCIUM 7.6* 7.3* 7.0* 6.9*  MG  --  2.1  --   --   PHOS  --  4.9*  --  5.0*   GFR: Estimated Creatinine  Clearance: 11.6 mL/min (A) (by C-G formula based on SCr of 6 mg/dL (H)). Liver Function Tests: Recent Labs  Lab 06/16/22 2233 06/17/22 0506 06/18/22 0425 06/19/22 0618  AST 96* 80* 53* 38  ALT 93* 84* 62* 51*  ALKPHOS 82 75 63 56  BILITOT 0.1* 0.4 0.3 0.6  PROT 7.4 6.8 6.4* 6.4*  ALBUMIN 3.4* 3.1* 2.9* 2.8*   No results for input(s): "LIPASE", "AMYLASE" in the last 168 hours. Recent Labs  Lab 06/16/22 2233  AMMONIA 18   Coagulation Profile: No results for input(s): "INR", "PROTIME" in the last 168 hours. Cardiac Enzymes: Recent Labs  Lab 06/17/22 1005  CKTOTAL 3,685*   BNP (last 3 results) No results for input(s): "PROBNP" in the last 8760 hours. HbA1C: No results for input(s): "HGBA1C" in the last 72 hours. CBG: Recent Labs  Lab 06/17/22 0118  GLUCAP 76   Lipid Profile: No results for input(s): "CHOL", "HDL", "LDLCALC", "TRIG", "CHOLHDL", "LDLDIRECT" in the last 72 hours. Thyroid Function Tests: Recent Labs    06/17/22 1005  TSH 130.479*   Anemia Panel: No results for input(s): "VITAMINB12", "FOLATE", "FERRITIN", "TIBC", "IRON", "RETICCTPCT" in the last 72 hours. Sepsis Labs: Recent Labs  Lab 06/16/22 2233  LATICACIDVEN 0.6    No results found for this or any previous visit (from the past 240 hour(s)).       Radiology Studies: US RENAL  Result Date: 06/17/2022 CLINICAL DATA:  [id number] AKI (acute kidney injury) [id number] EXAM: RENAL / URINARY TRACT ULTRASOUND COMPLETE COMPARISON:  None Available. FINDINGS: The right kidney measured 10.2 cm and the left kidney measured 10.4 cm. The kidneys demonstrate increased echogenicity consistent with chronic medical renal disease. No renal parenchymal lesions are identified. No shadowing stones are seen. No hydronephrosis identified. Urinary bladder wall thickening and focal nodularity raises concern for possible neoplastic process. Cystoscopic correlation should be considered. Bladder volumes were not measured.  IMPRESSION: 1. Echogenic kidneys consistent with chronic medical renal disease. 2. Urinary bladder wall thickening and focal nodularity concerning for possible neoplastic etiology. Correlation with cystoscopy should be considered. Electronically Signed   By: Layla Maw M.D.   On: 06/17/2022 15:29        Scheduled Meds:  heparin  5,000 Units Subcutaneous Q8H   levothyroxine  250 mcg Oral Q0600   nicotine  14 mg Transdermal Daily   polyethylene glycol  17 g Oral Daily   senna  2 tablet Oral BID   sodium bicarbonate  1,300 mg Oral BID   Continuous Infusions:  sodium chloride 75 mL/hr at 06/18/22 2138     LOS: 2 days    Time spent: 45 minutes spent on chart review, discussion with nursing staff, consultants, updating family and interview/physical exam; more than  50% of that time was spent in counseling and/or coordination of care.    Joseph Art, DO [facility] Available via Epic secure chat 7am-7pm After these hours, please refer to coverage provider listed on [email or web address] 06/19/2022, 11:14 AM

## 2022-06-19 NOTE — TOC Progression Note (Signed)
Transition of Care [facility]) - Progression Note    Patient Details  Name: Miguel Colon MRN: [id number] Date of Birth: 1955/10/10  [facility] CM/SW Contact  Catalina Gravel, [place] Phone Number: 06/19/2022, 3:38 PM  Clinical Narrative:    Pt needs to be seen by urology before DC.  Expected to remain 3-4 days.     Barriers to Discharge: Continued Medical Work up  Expected Discharge Plan and Services                                               Social Determinants of Health (SDOH) Interventions SDOH Screenings   Food Insecurity: No Food Insecurity (09/16/2020)  Housing: Medium Risk (09/16/2020)  Transportation Needs: Unmet Transportation Needs (10/16/2020)  Alcohol Screen: Low Risk  (09/16/2020)  Depression (PHQ2-9): Low Risk  (09/16/2020)  Financial Resource Strain: Medium Risk (09/16/2020)  Physical Activity: Insufficiently Active (09/16/2020)  Social Connections: Socially Isolated (09/16/2020)  Stress: No Stress Concern Present (09/16/2020)  Tobacco Use: Medium Risk (06/16/2022)    Readmission Risk Interventions     No data to display

## 2022-06-20 DIAGNOSIS — N189 Chronic kidney disease, unspecified: Secondary | ICD-10-CM | POA: Diagnosis not present

## 2022-06-20 DIAGNOSIS — N179 Acute kidney failure, unspecified: Secondary | ICD-10-CM | POA: Diagnosis not present

## 2022-06-20 LAB — COMPREHENSIVE METABOLIC PANEL
ALT: 45 U/L — ABNORMAL HIGH (ref 0–44)
AST: 31 U/L (ref 15–41)
Albumin: 2.9 g/dL — ABNORMAL LOW (ref 3.5–5.0)
Alkaline Phosphatase: 52 U/L (ref 38–126)
Anion gap: 9 (ref 5–15)
BUN: 68 mg/dL — ABNORMAL HIGH (ref 8–23)
CO2: 21 mmol/L — ABNORMAL LOW (ref 22–32)
Calcium: 6.8 mg/dL — ABNORMAL LOW (ref 8.9–10.3)
Chloride: 111 mmol/L (ref 98–111)
Creatinine, Ser: 4.75 mg/dL — ABNORMAL HIGH (ref 0.61–1.24)
GFR, Estimated: 13 mL/min — ABNORMAL LOW (ref >60)
Glucose, Bld: 83 mg/dL (ref 70–99)
Potassium: 4.7 mmol/L (ref 3.5–5.1)
Sodium: 141 mmol/L (ref 135–145)
Total Bilirubin: 0.6 mg/dL (ref 0.3–1.2)
Total Protein: 6.1 g/dL — ABNORMAL LOW (ref 6.5–8.1)

## 2022-06-20 LAB — C3 COMPLEMENT: C3 Complement: 119 mg/dL (ref 82–167)

## 2022-06-20 LAB — C4 COMPLEMENT: Complement C4, Body Fluid: 30 mg/dL (ref 12–38)

## 2022-06-20 NOTE — Progress Notes (Signed)
PROGRESS NOTE    Miguel Colon  ZOX:[id number] DOB: [birth year] DOA: 06/16/2022 PCP: [name], FNP    Brief Narrative:   Miguel Colon is a 67 y.o. male with medical history significant of hypothyroidism, small lung cancer who presents emergency department due to altered mental status.  Patient complained of 2 to 3-day onset of generalized weakness and fatigue, was unable to provide more history.  Rest of the history was obtained from ED physician and ED medical record.  Per report, patient's neighbor went to check on him since he was not seen outside his house for a few days, he was noted to be confused and disoriented.  Patient was reported to have short-term memory problems.  He denies fever, chills, chest pain, shortness of breath, nausea, vomiting or abdominal pain.  Cr slowly improving.     Assessment and Plan:   Acute kidney injury superimposed on CKD BUN/creatinine of 109/8.84 (baseline creatinine at 2) Continue gentle hydration- no sign of volume overload Renally adjust medications, avoid nephrotoxic agents/dehydration/hypotension -appreciate nephrology -PVR not able to be done due to no working machine currently   Abnormal renal U/S -Urology consult for possible cystoscopy - await consult    Altered mental status, generalized weakness and fatigue secondary to above (AKI and dehydration) Continue fall precaution  -? 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 Ellin Saba but appears to have been lost to follow up again-- last seen 6/23- at that time: Recommend repeat imaging of the CT of the chest with  contrast in 2 months.  -will need to follow up outpatient   Tobacco abuse Patient was counseled on tobacco abuse cessation   DVT prophylaxis: heparin injection 5,000 Units Start: 06/17/22 0600 SCDs Start: 06/17/22 8119    Code Status: Full Code Family Communication: called niece 4/19  Disposition Plan:  Level of care: Med-Surg Status is: Inpatient Remains inpatient appropriate because: needs IVF    Consultants:  renal   Subjective: No swelling, no SOB  Objective: Vitals:   06/19/22 0435 06/19/22 1515 06/19/22 2100 06/20/22 0411  BP: 110/89 98/71 120/72 134/81  Pulse: 87 63 (!) 56 (!) 56  Resp: Temp: 97.9 F (36.6 C) 98 F (36.7 C) 98.1 F (36.7 C) 97.7 F (36.5 C)  TempSrc: Oral Oral Oral Axillary  SpO2: 100% 100% 98% 98%  Weight:      Height:        Intake/Output Summary (Last 24 hours) at 06/20/2022 1017 Last data filed at 06/20/2022 0411 Gross per 24 hour  Intake 355 ml  Output 800 ml  Net -445 ml   Filed Weights   06/16/22 2145 06/17/22 0145  Weight: 70.3 kg 67 kg    Examination:    General: Appearance:    Well developed, well nourished male in no acute distress     Lungs:     respirations unlabored  Heart:    Bradycardic. Normal rhythm. No murmurs, rubs, or gallops.   MS:   All extremities are intact.   Neurologic:   Awake, alert       Data Reviewed: I have personally reviewed following  labs and imaging studies  CBC: Recent Labs  Lab 06/16/22 2233 06/17/22 0506 06/18/22 0425  WBC 6.8 7.2 6.8  NEUTROABS 4.7  --   --   HGB 12.4* 11.8* 11.6*  HCT 36.2* 35.0* 34.7*  MCV 94.5 95.1 95.6  PLT 263 248 253   Basic Metabolic Panel: Recent Labs  Lab 06/16/22 2233 06/17/22 0506 06/18/22 0425 06/19/22 0618 06/20/22 0621  NA 137 137 139 139 141  K 5.0 5.0 5.1 5.0 4.7  CL 104 107 109 109 111  CO2 21* 19* 19* 21* 21*  GLUCOSE 88 93 83 92 83  BUN 109* 107* 95* 83* 68*  CREATININE 8.84* 8.51* 7.36* 6.00* 4.75*  CALCIUM  7.6* 7.3* 7.0* 6.9* 6.8*  MG  --  2.1  --   --   --   PHOS  --  4.9*  --  5.0*  --    GFR: Estimated Creatinine Clearance: 14.7 mL/min (A) (by C-G formula based on SCr of 4.75 mg/dL (H)). Liver Function Tests: Recent Labs  Lab 06/16/22 2233 06/17/22 0506 06/18/22 0425 06/19/22 0618 06/20/22 0621  AST 96* 80* 53* 38 31  ALT 93* 84* 62* 51* 45*  ALKPHOS 82 75 63 56 52  BILITOT 0.1* 0.4 0.3 0.6 0.6  PROT 7.4 6.8 6.4* 6.4* 6.1*  ALBUMIN 3.4* 3.1* 2.9* 2.8* 2.9*   No results for input(s): "LIPASE", "AMYLASE" in the last 168 hours. Recent Labs  Lab 06/16/22 2233  AMMONIA 18   Coagulation Profile: No results for input(s): "INR", "PROTIME" in the last 168 hours. Cardiac Enzymes: Recent Labs  Lab 06/17/22 1005  CKTOTAL 3,685*   BNP (last 3 results) No results for input(s): "PROBNP" in the last 8760 hours. HbA1C: No results for input(s): "HGBA1C" in the last 72 hours. CBG: Recent Labs  Lab 06/17/22 0118  GLUCAP 76   Lipid Profile: No results for input(s): "CHOL", "HDL", "LDLCALC", "TRIG", "CHOLHDL", "LDLDIRECT" in the last 72 hours. Thyroid Function Tests: No results for input(s): "TSH", "T4TOTAL", "FREET4", "T3FREE", "THYROIDAB" in the last 72 hours.  Anemia Panel: No results for input(s): "VITAMINB12", "FOLATE", "FERRITIN", "TIBC", "IRON", "RETICCTPCT" in the last 72 hours. Sepsis Labs: Recent Labs  Lab 06/16/22 2233  LATICACIDVEN 0.6    No results found for this or any previous visit (from the past 240 hour(s)).       Radiology Studies: No results found.      Scheduled Meds:  heparin  5,000 Units Subcutaneous Q8H   levothyroxine  250 mcg Oral Q0600   nicotine  14 mg Transdermal Daily   polyethylene glycol  17 g Oral Daily   senna  2 tablet Oral BID   sodium bicarbonate  1,300 mg Oral BID   Continuous Infusions:  sodium chloride 75 mL/hr at 06/19/22 2347     LOS: 3 days    Time spent: 45 minutes spent on chart review, discussion with  nursing staff, consultants, updating family and interview/physical exam; more than 50% of that time was spent in counseling and/or coordination of care.    Joseph Art, DO [facility] Available via Epic secure chat 7am-7pm After these hours, please refer to coverage provider listed on [email or web address] 06/20/2022, 10:17 AM

## 2022-06-20 NOTE — Progress Notes (Signed)
Chart and labs reviewed. Great improvement in Cr, down to 4.75. Vitals stable. Continue to monitor daily labs and strict I/O. Continue with gentle isotonic fluids provided volume status is stable. Cystoscopy pending. Will be seen tomorrow. Please call with any questions/concerns in the interim.  Anthony Sar, MD [facility]

## 2022-06-21 DIAGNOSIS — N189 Chronic kidney disease, unspecified: Secondary | ICD-10-CM | POA: Diagnosis not present

## 2022-06-21 DIAGNOSIS — N179 Acute kidney failure, unspecified: Secondary | ICD-10-CM | POA: Diagnosis not present

## 2022-06-21 LAB — COMPREHENSIVE METABOLIC PANEL
ALT: 35 U/L (ref 0–44)
AST: 26 U/L (ref 15–41)
Albumin: 2.6 g/dL — ABNORMAL LOW (ref 3.5–5.0)
Alkaline Phosphatase: 46 U/L (ref 38–126)
Anion gap: 8 (ref 5–15)
BUN: 58 mg/dL — ABNORMAL HIGH (ref 8–23)
CO2: 21 mmol/L — ABNORMAL LOW (ref 22–32)
Calcium: 6.7 mg/dL — ABNORMAL LOW (ref 8.9–10.3)
Chloride: 111 mmol/L (ref 98–111)
Creatinine, Ser: 3.93 mg/dL — ABNORMAL HIGH (ref 0.61–1.24)
GFR, Estimated: 16 mL/min — ABNORMAL LOW (ref >60)
Glucose, Bld: 90 mg/dL (ref 70–99)
Potassium: 4.6 mmol/L (ref 3.5–5.1)
Sodium: 140 mmol/L (ref 135–145)
Total Bilirubin: 0.6 mg/dL (ref 0.3–1.2)
Total Protein: 5.9 g/dL — ABNORMAL LOW (ref 6.5–8.1)

## 2022-06-21 MED ORDER — LEVOTHYROXINE SODIUM 125 MCG PO TABS
250.0000 ug | ORAL_TABLET | Freq: Every day | ORAL | 3 refills | Status: DC
Start: 2022-06-21 — End: 2022-08-10

## 2022-06-21 MED ORDER — CALCIUM GLUCONATE-NACL 1-0.675 GM/50ML-% IV SOLN
1.0000 g | Freq: Once | INTRAVENOUS | Status: AC
  Administered 2022-06-21: 1000 mg via INTRAVENOUS
  Filled 2022-06-21: qty 50

## 2022-06-21 NOTE — Discharge Summary (Addendum)
Physician Discharge Summary  Miguel Colon ZHY:[id number] DOB: 20-Aug-1955 DOA: 06/16/2022  PCP: [name], FNP  Admit date: 06/16/2022 Discharge date: 06/21/2022  Admitted From: home Discharge disposition: home   Recommendations for Outpatient Follow-Up:   Family to get more involved in making sure patient makes it to appointment and take medications TSH 6 weeks BMP 7-10 days with PCP Will need close outpatient follow up with:  Nephrology-- Dr. Marisue Humble to schedule appointment for follow up regarding Chronic kidney disease Urology- Dr. Ronne Binning for abnormal renal U/S-- may need cystoscopy -- referral placed Oncology- Dr. Ellin Saba: Recommend repeat imaging of the CT of the chest with contrast (once Cr improved)-- will need to follow up re: swelling in left arm (patient says has been on-going for a while-- no pain, no loss of function.) 5.  Consider referral to palliative care   Discharge Diagnosis:   Principal Problem:   Acute kidney injury superimposed on CKD Active Problems:   Acquired hypothyroidism   Constipation   Small cell lung cancer, left   Substance abuse   Transaminitis   Dehydration   Tobacco abuse   Altered mental status   AKI (acute kidney injury)    Discharge Condition: Improved.  Diet recommendation: renal diet  Wound care: None.  Code status: Full.   History of Present Illness:   Miguel Colon is a 67 y.o. male with medical history significant of hypothyroidism, small lung cancer who presents emergency department due to altered mental status.  Patient complained of 2 to 3-day onset of generalized weakness and fatigue, was unable to provide more history.  Rest of the history was obtained from ED physician and ED medical record.  Per report, patient's neighbor went to check on him since he was not seen outside his house for a few days, he was noted to be confused and disoriented.  Patient was reported to have short-term memory problems.  He  denies fever, chills, chest pain, shortness of breath, nausea, vomiting or abdominal pain.    Hospital Course by Problem:    Acute kidney injury superimposed on CKD BUN/creatinine of 109/8.84 (baseline creatinine at 2) -appreciate nephrology: Negative ANCA, ANA, normal C3, normal C4.  His renal US noted echogenic kidneys c/w with chronic medical renal disease but note bladder nodularity concerning for possible cancer as below  -outpatient follow up   Abnormal renal U/S -Urology consulted-- recommended outpatient follow up   Altered mental status, generalized weakness and fatigue secondary to above (AKI and dehydration) Continue fall precaution  -? 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 Ellin Saba but appears to have been lost to follow up again-- last seen 6/23- at that time: Recommend repeat imaging of the CT of the chest with contrast in 2 months.  -will need to follow up outpatient   Tobacco abuse Patient was counseled on tobacco abuse cessation    Medical Consultants:   Renal  Urology (phone)   Discharge Exam:   Vitals:   06/20/22 2110 06/21/22 0431  BP: (!) 146/90 (!) 142/91  Pulse: 63  61  Resp: 20 20  Temp: 98.7 F (37.1 C) 97.6 F (36.4 C)  SpO2: 98% 99%   Vitals:   06/20/22 0411 06/20/22 1450 06/20/22 2110 06/21/22 0431  BP: 134/81 (!) 141/89 (!) 146/90 (!) 142/91  Pulse: (!) 56 64 63 61  Resp:   20 20  Temp: 97.7 F (36.5 C) 98.5 F (36.9 C) 98.7 F (37.1 C) 97.6 F (36.4 C)  TempSrc: Axillary Oral Oral   SpO2: 98% 96%  98% 99%  Weight:      Height:        General exam: Appears calm and comfortable.  Left arm with swelling > right-- no change to range of motion and no pain with palpation-- ? Related to untreated cancer-- arrange appointment with oncology on Wednesday   The results of significant diagnostics from this hospitalization (including imaging, microbiology, ancillary and laboratory) are listed below for reference.     Procedures and Diagnostic Studies:   US RENAL  Result Date: 06/17/2022 CLINICAL DATA:  [id number] AKI (acute kidney injury) [id number] EXAM: RENAL / URINARY TRACT ULTRASOUND COMPLETE COMPARISON:  None Available. FINDINGS: The right kidney measured 10.2 cm and the left kidney measured 10.4 cm. The kidneys demonstrate increased echogenicity consistent with chronic medical renal disease. No renal parenchymal lesions are identified. No shadowing stones are seen. No hydronephrosis identified. Urinary bladder wall thickening and focal nodularity raises concern for possible neoplastic process. Cystoscopic correlation should be considered. Bladder volumes were not measured. IMPRESSION: 1. Echogenic kidneys consistent with chronic medical renal disease. 2. Urinary bladder wall thickening and focal nodularity concerning for possible neoplastic etiology. Correlation with cystoscopy should be considered. Electronically Signed   By: Layla Maw M.D.   On: 06/17/2022 15:29   DG Chest 1 View  Result Date: 06/16/2022 CLINICAL DATA:  Altered mental status. History of lung cancer. EXAM: CHEST  1 VIEW COMPARISON:  Radiograph 05/19/2022. PET CT 07/16/2021 FINDINGS: Tip of the right chest port in the SVC. Again seen hyperinflation and emphysema. The apical pulmonary nodules on prior PET CT are not well-defined by radiograph. The heart is normal in size. There is no focal airspace disease, pleural effusion, or pneumothorax. No acute osseous abnormalities are seen. IMPRESSION: 1. No acute findings. 2. Chronic  hyperinflation and emphysema. Electronically Signed   By: Narda Rutherford M.D.   On: 06/16/2022 22:27     Labs:   Basic Metabolic Panel: Recent Labs  Lab 06/17/22 0506 06/18/22 0425 06/19/22 0618 06/20/22 0621 06/21/22 0419  NA 137 139 139 141 140  K 5.0 5.1 5.0 4.7 4.6  CL 107 109 109 111 111  CO2 19* 19* 21* 21* 21*  GLUCOSE 93 83 92 83 90  BUN 107* 95* 83* 68* 58*  CREATININE 8.51* 7.36* 6.00* 4.75* 3.93*  CALCIUM 7.3* 7.0* 6.9* 6.8* 6.7*  MG 2.1  --   --   --   --   PHOS 4.9*  --  5.0*  --   --    GFR Estimated Creatinine Clearance: 17.8 mL/min (A) (by C-G formula based on SCr of 3.93 mg/dL (H)). Liver Function Tests: Recent Labs  Lab 06/17/22 0506 06/18/22 0425 06/19/22 0618 06/20/22 0621 06/21/22 0419  AST 80* 53* 38 31 26  ALT 84* 62* 51* 45* 35  ALKPHOS 75 63 56 52 46  BILITOT 0.4 0.3 0.6 0.6 0.6  PROT 6.8 6.4* 6.4* 6.1* 5.9*  ALBUMIN 3.1* 2.9* 2.8* 2.9* 2.6*   No results for input(s): "LIPASE", "AMYLASE" in the  last 168 hours. Recent Labs  Lab 06/16/22 2233  AMMONIA 18   Coagulation profile No results for input(s): "INR", "PROTIME" in the last 168 hours.  CBC: Recent Labs  Lab 06/16/22 2233 06/17/22 0506 06/18/22 0425  WBC 6.8 7.2 6.8  NEUTROABS 4.7  --   --   HGB 12.4* 11.8* 11.6*  HCT 36.2* 35.0* 34.7*  MCV 94.5 95.1 95.6  PLT 263 248 253   Cardiac Enzymes: Recent Labs  Lab 06/17/22 1005  CKTOTAL 3,685*   BNP: Invalid input(s): "POCBNP" CBG: Recent Labs  Lab 06/17/22 0118  GLUCAP 76   D-Dimer No results for input(s): "DDIMER" in the last 72 hours. Hgb A1c No results for input(s): "HGBA1C" in the last 72 hours. Lipid Profile No results for input(s): "CHOL", "HDL", "LDLCALC", "TRIG", "CHOLHDL", "LDLDIRECT" in the last 72 hours. Thyroid function studies No results for input(s): "TSH", "T4TOTAL", "T3FREE", "THYROIDAB" in the last 72 hours.  Invalid input(s): "FREET3" Anemia work up No results for input(s): "VITAMINB12",  "FOLATE", "FERRITIN", "TIBC", "IRON", "RETICCTPCT" in the last 72 hours. Microbiology No results found for this or any previous visit (from the past 240 hour(s)).   Discharge Instructions:   Discharge Instructions     Ambulatory referral to Urology   Complete by: As directed    Diet - low sodium heart healthy   Complete by: As directed    Discharge instructions   Complete by: As directed    Stop using cocaine BMP 7-10 days with PCP Will need close outpatient follow up with:  Nephrology-- Dr. Marisue Humble to schedule appointment regarding Chronic kidney disease Urology- Dr. Ronne Binning for abnormal renal U/S-- may need cystoscopy  Oncology- Dr. Ellin Saba: Recommend repeat imaging of the CT of the chest with contrast   Increase activity slowly   Complete by: As directed       Allergies as of 06/21/2022   No Known Allergies      Medication List     STOP taking these medications    ETOPOSIDE IV   gabapentin 300 MG capsule Commonly known as: NEURONTIN   Lactulose 20 GM/30ML Soln   lidocaine-prilocaine cream Commonly known as: EMLA   methadone 10 MG tablet Commonly known as: DOLOPHINE   naloxegol oxalate 25 MG Tabs tablet Commonly known as: Movantik   oxyCODONE-acetaminophen 5-325 MG tablet Commonly known as: PERCOCET/ROXICET   predniSONE 10 MG (21) Tbpk tablet Commonly known as: STERAPRED UNI-PAK 21 TAB   prochlorperazine 10 MG tablet Commonly known as: COMPAZINE   senna 8.6 MG Tabs tablet Commonly known as: SENOKOT   sucralfate 1 g tablet Commonly known as: Carafate   traZODone 50 MG tablet Commonly known as: DESYREL       TAKE these medications    acetaminophen 500 MG tablet Commonly known as: TYLENOL Take 2 tablets (1,000 mg total) by mouth 3 (three) times daily.   Ensure Take 237 mLs by mouth daily.   levothyroxine 125 MCG tablet Commonly known as: Synthroid Take 2 tablets (250 mcg total) by mouth daily before breakfast.   lidocaine 5  % Commonly known as: LIDODERM Place 1 patch onto the skin daily. Remove & Discard patch within 12 hours or as directed by MD   nicotine 14 mg/24hr patch Commonly known as: NICODERM CQ - dosed in mg/24 hours Place 1 patch (14 mg total) onto the skin daily.   ondansetron 8 MG tablet Commonly known as: Zofran Take 1 tablet (8 mg total) by mouth 2 (two) times daily as needed. Start  on the third day after cisplatin chemotherapy.   polyethylene glycol 17 g packet Commonly known as: MiraLax Take 17 g by mouth daily.        Follow-up Information     [name], FNP Follow up in 1 week(s).   Specialty: Family Medicine Why: BMP Contact information: [address] [place] [address] [phone]         Malen Gauze, MD. Call.   Specialty: Urology Why: call for appointment Contact information: [address] [place] [address] [phone]                  Time coordinating discharge: 45 min  Signed:  Joseph Art DO  [facility] 06/21/2022, 10:43 AM

## 2022-06-21 NOTE — Progress Notes (Signed)
Patient discharged home today, transported home by [employer]. Discharge summary went over with patient, patient verbalized understanding. Belongings sent home with patient. Patients niece Selena Batten made aware of patients discharge.

## 2022-06-21 NOTE — Progress Notes (Signed)
Mobility Specialist Progress Note:   06/21/22 0940  Mobility  Activity Ambulated with assistance in hallway  Level of Assistance Modified independent, requires aide device or extra time  Assistive Device Other (Comment) (IV pole, Gait Belt)  Distance Ambulated (ft) 100 ft  Activity Response Tolerated well  Mobility Referral Yes  $Mobility charge 1 Mobility   Pt agreeable to mobility session. Tolerated well, asx throughout. Required ModI with handrails in hallway, CGA with gait belt for safety. Returned pt to room, all needs met, call bell in reach, bed alarm on.   Feliciana Rossetti Mobility Specialist Please contact via Special educational needs teacher or  Rehab office at [phone]

## 2022-06-21 NOTE — Care Management Important Message (Signed)
Important Message  Patient Details  Name: Miguel Colon MRN: [id number] Date of Birth: 03-15-1955   Medicare Important Message Given:  Yes     Corey Harold 06/21/2022, 12:44 PM

## 2022-06-21 NOTE — Progress Notes (Signed)
Noted patients left arm swollen non-pitting and abrasion to left shoulder. Patient reported no complaints of pain or discomfort. MD Benjamine Mola made aware. No new orders.

## 2022-06-21 NOTE — Progress Notes (Signed)
[facility] Progress Note  Name: Miguel Colon MRN: [id number] DOB: 1955-08-01  Chief Complaint:  Found down altered at home   Subjective:  Creatinine significantly improved over the weekend Urine output climbing, 1.2 L yesterday Blood pressures fairly stable Patient eating and drinking well, remains on IV fluids He is without complaints  Review of systems:  Denies shortness of breath or chest pain  Denies nausea/vomiting  No cramping or dizziness  ---------- Background on consult:    Miguel Colon is an 67 y.o. male with a PMH significant for hypothyroidism, asthma, polysubstance abuse and small cell lung cancer stage IIIa s/p cisplat and etoposide and XRT in 09/2020 (lost to follow up and did not complete XRT), and CKD Stage IIIa who presented to [facility] ED on 06/16/22 after being found at home by a neighbor with AMS.  In the ED, he was noted to be confused and disoriented.  HR in the 50's, Bp 136/91, SpO2 98%.  Labs notable for Hgb 12.4, BUN 109, Scr 8.84, Co2 21, normal lactate, + cocaine on UDS, Etoh < 10, UA + blood, no protein, CXR NAD.  We were consulted due to the development of AKI/CKD stage IIIa.  The trend in Scr is seen below.     He denies any knowledge of pre-existing CKD.  He is more awake and alert today.  Oriented to person and place, knows it is 2024 but did not know the month.  He reports that he does not remember anything prior to admission other than he didn't "feel well" but could not be specific.  He denies any N/V/D, productive cough, SOB, lower extremity edema, dysuria, pyuria, hematuria, urgency, frequency, or retention.  He also denies any cocaine use or alcohol but does smoke cigarettes.  No history of nephrolithiasis.       Intake/Output Summary (Last 24 hours) at 06/21/2022 0913 Last data filed at 06/21/2022 0600 Gross per 24 hour  Intake 5434.5 ml  Output 1200 ml  Net 4234.5 ml     Vitals:  Vitals:   06/20/22 0411 06/20/22 1450 06/20/22 2110  06/21/22 0431  BP: 134/81 (!) 141/89 (!) 146/90 (!) 142/91  Pulse: (!) 56 64 63 61  Resp:   20 20  Temp: 97.7 F (36.5 C) 98.5 F (36.9 C) 98.7 F (37.1 C) 97.6 F (36.4 C)  TempSrc: Axillary Oral Oral   SpO2: 98% 96% 98% 99%  Weight:      Height:         Physical Exam:  General adult male in bed in no acute distress, awake, alert, eating HEENT normocephalic atraumatic extraocular movements intact sclera anicteric Neck supple trachea midline Lungs clear to auscultation bilaterally normal work of breathing at rest  Heart regular rate and rhythm no rubs or gallops appreciated Abdomen soft nontender nondistended Extremities no edema  Psych normal mood and affect Neuro AAO x3, nonfocal GU no foley;   Medications reviewed   Labs:     Latest Ref Rng & Units 06/21/2022    4:19 AM 06/20/2022    6:21 AM 06/19/2022    6:18 AM  BMP  Glucose 70 - 99 mg/dL 90  83  92   BUN 8 - 23 mg/dL 58  68  83   Creatinine 0.61 - 1.24 mg/dL 4.09  8.11  9.14   Sodium 135 - 145 mmol/L 140  141  139   Potassium 3.5 - 5.1 mmol/L 4.6  4.7  5.0   Chloride 98 - 111 mmol/L  111  111  109   CO2 22 - 32 mmol/L Calcium 8.9 - 10.3 mg/dL 6.7  6.8  6.9      Assessment/Plan:     AKI on CKD3, resolving, nonoliguric - likely volume depleted but also + cocaine on UDS, moderate hematuria on UA with only 6-10 RBC/HPF.  Mild rhabdomyolysis as he was confused and may have been down for some time before he was found by his neighbor.  CK was 3685.  Negative ANCA, ANA, normal C3, normal C4.  His renal US noted echogenic kidneys c/w with chronic medical renal disease but note bladder nodularity concerning for possible cancer as below Stop IVFs Stop NaHCO3 Cont to orally hydration, good PO Will sign off for now.  Please call with any questions or concerns.  Pt does need follow up with nephrology and I will make arrangements  Bladder wall thickening and nodularity - per Urology.  No evidence of obstructive  defects CKD stage 3b - baseline Cr 2.  Hyperkalemia -resolved Metabolic acidosis -no role for sodium bicarbonate, discontinue Acute metabolic encephalopathy - Improved Abnormal LFT's - Improved, per [facility] Hypothyroidism - per primary team.   H/o stage IIIa small cell lung cancer -needs to reconnect with oncology Cocaine + - pt denies use per charting.  Tobacco abuse - per primary svc.  Miguel Miss, MD 06/21/2022 9:13 AM

## 2022-06-23 ENCOUNTER — Inpatient Hospital Stay: Payer: 59 | Admitting: Hematology

## 2022-06-24 ENCOUNTER — Inpatient Hospital Stay: Payer: 59 | Attending: Hematology | Admitting: Hematology

## 2022-06-24 ENCOUNTER — Encounter: Payer: Self-pay | Admitting: Hematology

## 2022-06-24 VITALS — BP 118/75 | HR 62 | Temp 97.6°F | Resp 18 | Wt 156.5 lb

## 2022-06-24 DIAGNOSIS — C3492 Malignant neoplasm of unspecified part of left bronchus or lung: Secondary | ICD-10-CM | POA: Diagnosis not present

## 2022-06-24 DIAGNOSIS — C3412 Malignant neoplasm of upper lobe, left bronchus or lung: Secondary | ICD-10-CM | POA: Diagnosis present

## 2022-06-24 DIAGNOSIS — R609 Edema, unspecified: Secondary | ICD-10-CM | POA: Insufficient documentation

## 2022-06-24 DIAGNOSIS — Z800 Family history of malignant neoplasm of digestive organs: Secondary | ICD-10-CM | POA: Insufficient documentation

## 2022-06-24 DIAGNOSIS — Z87891 Personal history of nicotine dependence: Secondary | ICD-10-CM | POA: Diagnosis not present

## 2022-06-24 NOTE — Patient Instructions (Signed)
[facility]  Discharge Instructions  You were seen and examined today by Dr. Ellin Saba.  Dr. Ellin Saba discussed your most recent lab work which revealed that your kidney functions have went up since the last time Dr. Ellin Saba seen you.  Dr. Ellin Saba is ordering Doppler of your arm to rule out blood clot causing your arm swelling. He is also ordering PET scan and Brain MRI to check on your cancer. These are without contrast since your kidney functions are very elevated.  Follow-up as scheduled.    Thank you for choosing  [facility] to provide your oncology and hematology care.   To afford each patient quality time with our provider, please arrive at least 15 minutes before your scheduled appointment time. You may need to reschedule your appointment if you arrive late (10 or more minutes). Arriving late affects you and other patients whose appointments are after yours.  Also, if you miss three or more appointments without notifying the office, you may be dismissed from the clinic at the provider's discretion.    Again, thank you for choosing [facility].  Our hope is that these requests will decrease the amount of time that you wait before being seen by our physicians.   If you have a lab appointment with the Cancer Center - please note that after April 8th, all labs will be drawn in the cancer center.  You do not have to check in or register with the main entrance as you have in the past but will complete your check-in at the cancer center.            _____________________________________________________________  Should you have questions after your visit to [facility], please contact our office at [phone] and follow the prompts.  Our office hours are 8:00 a.m. to 4:30 p.m. Monday - Thursday and 8:00 a.m. to 2:30 p.m. Friday.  Please note that voicemails left after 4:00 p.m. may not be returned until  the following business day.  We are closed weekends and all major holidays.  You do have access to a nurse 24-7, just call the main number to the clinic [phone] and do not press any options, hold on the line and a nurse will answer the phone.    For prescription refill requests, have your pharmacy contact our office and allow 72 hours.    Masks are no longer required in the cancer centers. If you would like for your care team to wear a mask while they are taking care of you, please let them know. You may have one support person who is at least 66 years old accompany you for your appointments.

## 2022-06-24 NOTE — Progress Notes (Signed)
[facility] [address] [place] [address]    Clinic Day:  06/24/2022  Referring physician: [name], FNP  Patient Care Team: [name], FNP as PCP - General (Family Medicine) Jena Gauss, Gerrit Friends, MD as Consulting Physician (Gastroenterology) Therese Sarah, RN as Oncology Nurse Navigator (Medical Oncology) Doreatha Massed, MD as Medical Oncologist (Medical Oncology)   ASSESSMENT & PLAN:   Assessment: 1.  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  IHC shows positivity for TTF-1, synaptophysin and chromogranin (dot-like positivity), consistent with small cell lung cancer. - MRI of the brain without contrast on 09/02/2020-no metastasis. - PET scan on 10/02/2020 with 8 cm left apical lung mass, hypermetabolic, highly suspicious for Pancoast tumor with chest wall invasion.  No findings suggestive of mediastinal/hilar adenopathy or other metastatic disease. - 15 pound weight loss in the last 2 months. - MR C-spine on 09/02/2020 with the left apical lung mass likely involving the left brachial plexus.  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  Severe bilateral neural foraminal stenosis at C3-C4. - Chemoradiation therapy with cisplatin and etoposide started on 10/07/2020, received cycle 3 of carboplatin and etoposide on 11/19/2020, lost to follow-up after that. - He did not complete radiation therapy.  2.  Social/family history: - Quit smoking on 08/05/2020.  He was a Naval architect. - He lives by himself. - Paternal aunt died of cancer 30 years ago.  Type unknown.    Plan: 1.  Stage IIIa (T4 N0 M0) small cell lung cancer: - He was last seen by me in June 2023 and has lost to follow-up.  He missed multiple follow-up appointments. - Denies any pain in the left arm.  Denies  any weight loss. - He was recently hospitalized.  I have reviewed records. - I have recommended doing a PET CT scan and a brain MRI without contrast.  2.  Left upper extremity swelling: - He reported left upper extremity swelling in the last 1 week.  I think it is from lymphedema.  However I will order left upper extremity Doppler.   Orders Placed This Encounter  Procedures   NM PET Image Restag (PS) Skull Base To Thigh    Standing Status:   Future    Standing Expiration Date:   06/24/2023    Order Specific Question:   If indicated for the ordered procedure, I authorize the administration of a radiopharmaceutical per Radiology protocol    Answer:   Yes    Order Specific Question:   Preferred imaging location?    Answer:   [facility]    Order Specific Question:   Release to patient    Answer:   Immediate   MR Brain W Wo Contrast    Standing Status:   Future    Standing Expiration Date:   06/24/2023    Order Specific Question:   If indicated for the ordered procedure, I authorize the administration of contrast media per Radiology protocol    Answer:   Yes    Order Specific Question:   What is the patient's sedation requirement?    Answer:   No Sedation    Order Specific Question:   Does the patient have a pacemaker or implanted devices?    Answer:   No    Order Specific Question:   Use SRS  Protocol?    Answer:   No    Order Specific Question:   Preferred imaging location?    Answer:   [facility] (table limit [phone])    Order Specific Question:   Release to patient    Answer:   Immediate   US Venous Img Upper Uni Left    Standing Status:   Future    Standing Expiration Date:   06/24/2023    Order Specific Question:   Reason for Exam (SYMPTOM  OR DIAGNOSIS REQUIRED)    Answer:   lymphedema    Order Specific Question:   Preferred imaging location?    Answer:   [facility]    Order Specific Question:   Release to patient    Answer:   Immediate      I,Katie  Daubenspeck,acting as a scribe for Doreatha Massed, MD.,have documented all relevant documentation on the behalf of Doreatha Massed, MD,as directed by  Doreatha Massed, MD while in the presence of Doreatha Massed, MD.   I, Doreatha Massed MD, have reviewed the above documentation for accuracy and completeness, and I agree with the above.   Doreatha Massed, MD   4/25/20245:24 PM  CHIEF COMPLAINT:   Diagnosis: small cell lung cancer    Cancer Staging  Small cell lung cancer, left Staging form: Lung, AJCC 8th Edition - Clinical stage from 09/17/2020: Stage IIB (cT3, cN0, cM0) - Signed by Artis Delay, MD on 09/17/2020    Prior Therapy: Cycle 1 of cisplatin and etoposide on 10/07/2020 and XRT started on 10/23/2020   Current Therapy:  surveillance    HISTORY OF PRESENT ILLNESS:   Oncology History  Small cell lung cancer, left  08/04/2020 Imaging   No evidence of thoracic aortic aneurysm or dissection.   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   No findings specific for metastatic disease.   Aortic Atherosclerosis (ICD10-I70.0) and Emphysema (ICD10-J43.9).   09/02/2020 Imaging   MRI brain IMPRESSION: 1. No acute intracranial abnormality. 2. Mild chronic small vessel ischemic disease.   09/04/2020 Imaging   CT abdomen and pelvis 1. Left lung base nodule, likely metastatic. 2. No metastatic disease demonstrated in the abdomen or pelvis. 3. Diffusely stool-filled colon with prominent stool filled rectum, likely constipation.   09/05/2020 Pathology Results   FINAL MICROSCOPIC DIAGNOSIS:   A. LUNG, LUL, BIOPSY:  - Poorly differentiated carcinoma.  - See comment.   COMMENT:   The findings favor poorly differentiated non-small cell carcinoma.  Immunohistochemistry will be performed and reported as an addendum.   ADDENDUM:   By immunohistochemistry, the neoplastic cells are positive for TTF-1,  synaptophysin and chromogranin (dot-like positivity) but negative for cytokeratin 5/6, cytokeratin 7, CD56, p40, and p63.  Overall the  morphology and immunophenotype, are consistent with a small cell carcinoma.       09/17/2020 Initial Diagnosis   Small cell lung cancer, left (HCC)   09/17/2020 Cancer Staging   Staging form: Lung, AJCC 8th Edition - Clinical stage from 09/17/2020: Stage IIB (cT3, cN0, cM0) - Signed by Artis Delay, MD on 09/17/2020 Stage prefix: Initial diagnosis   10/07/2020 - 11/22/2020 Chemotherapy   Patient is on Treatment Plan : LUNG SMALL CELL Cisplatin D1 + Etoposide D1-3 q21d        INTERVAL HISTORY:   Miguel Colon is a 67 y.o. male presenting to clinic today for follow up of small cell lung cancer. He was last seen by me on  08/06/21.  Since his last visit, he presented to the ED on 06/17/22 with altered mental status, generalized weakness, and fatigue. He was found to have AKI and transaminitis. Renal US showed: echogenic kidneys consistent with chronic medical renal disease; urinary bladder wall thickening and focal nodularity concerning for possible neoplastic etiology. He is scheduled for follow up with Dr. Ronne Binning on 06/30/22.  Today, he states that he is doing well overall. His appetite level is at 50%. His energy level is at 50%.  PAST MEDICAL HISTORY:   Past Medical History: Past Medical History:  Diagnosis Date   Asthma    Back pain    Lung cancer    Migraines    Thyroid disease     Surgical History: Past Surgical History:  Procedure Laterality Date   IR IMAGING GUIDED PORT INSERTION  09/30/2020   THYROIDECTOMY      Social History: Social History   Socioeconomic History   Marital status: Legally Separated    Spouse name: Not on file   Number of children: 3   Years of education: Not on file   Highest education level: Not on file  Occupational History   Occupation: Retired  Tobacco Use   Smoking status: Former    Packs/day: 0.50    Years: 47.00     Additional pack years: 0.00    Total pack years: 23.50    Types: Cigarettes    Quit date: 09/02/2020    Years since quitting: 1.8   Smokeless tobacco: Never  Vaping Use   Vaping Use: Never used  Substance and Sexual Activity   Alcohol use: No   Drug use: No   Sexual activity: Not Currently  Other Topics Concern   Not on file  Social History Narrative   Not on file   Social Determinants of Health   Financial Resource Strain: Medium Risk (09/16/2020)   Overall Financial Resource Strain (CARDIA)    Difficulty of Paying Living Expenses: Somewhat hard  Food Insecurity: No Food Insecurity (09/16/2020)   Hunger Vital Sign    Worried About Running Out of Food in the Last Year: Never true    Ran Out of Food in the Last Year: Never true  Transportation Needs: Unmet Transportation Needs (10/16/2020)   PRAPARE - Transportation    Lack of Transportation (Medical): Yes    Lack of Transportation (Non-Medical): Yes  Physical Activity: Insufficiently Active (09/16/2020)   Exercise Vital Sign    Days of Exercise per Week: 5 days    Minutes of Exercise per Session: 20 min  Stress: No Stress Concern Present (09/16/2020)   [employer] of Occupational Health - Occupational Stress Questionnaire    Feeling of Stress : Not at all  Social Connections: Socially Isolated (09/16/2020)   Social Connection and Isolation Panel [NHANES]    Frequency of Communication with Friends and Family: Three times a week    Frequency of Social Gatherings with Friends and Family: Three times a week    Attends Religious Services: Never    Active Member of Clubs or Organizations: No    Attends Banker Meetings: Never    Marital Status: Separated  Intimate Partner Violence: Not At Risk (09/16/2020)   Humiliation, Afraid, Rape, and Kick questionnaire    Fear of Current or Ex-Partner: No    Emotionally Abused: No    Physically Abused: No    Sexually Abused: No    Family History: Family History   Problem Relation Age of Onset   Hypertension  Mother    Hypertension Father    Colon cancer Father 10    Current Medications:  Current Outpatient Medications:    acetaminophen (TYLENOL) 500 MG tablet, Take 2 tablets (1,000 mg total) by mouth 3 (three) times daily., Disp: 30 tablet, Rfl: 0   Ensure (ENSURE), Take 237 mLs by mouth daily., Disp: , Rfl:    levothyroxine (SYNTHROID) 125 MCG tablet, Take 2 tablets (250 mcg total) by mouth daily before breakfast., Disp: 60 tablet, Rfl: 3   lidocaine (LIDODERM) 5 %, Place 1 patch onto the skin daily. Remove & Discard patch within 12 hours or as directed by MD, Disp: 30 patch, Rfl: 0   nicotine (NICODERM CQ - DOSED IN MG/24 HOURS) 14 mg/24hr patch, Place 1 patch (14 mg total) onto the skin daily., Disp: 28 patch, Rfl: 0   ondansetron (ZOFRAN) 8 MG tablet, Take 1 tablet (8 mg total) by mouth 2 (two) times daily as needed. Start on the third day after cisplatin chemotherapy., Disp: 30 tablet, Rfl: 1   polyethylene glycol (MIRALAX) 17 g packet, Take 17 g by mouth daily., Disp: 30 each, Rfl: 2 No current facility-administered medications for this visit.  Facility-Administered Medications Ordered in Other Visits:    etoposide (VEPESID) 120 mg in sodium chloride 0.9 % 500 mL chemo infusion, 70 mg/m2 (Treatment Plan Recorded), Intravenous, Once, Doreatha Massed, MD   Allergies: No Known Allergies  REVIEW OF SYSTEMS:   Review of Systems  Constitutional:  Negative for chills, fatigue and fever.  HENT:   Negative for lump/mass, mouth sores, nosebleeds, sore throat and trouble swallowing.   Eyes:  Negative for eye problems.  Respiratory:  Negative for cough and shortness of breath.   Cardiovascular:  Negative for chest pain, leg swelling and palpitations.  Gastrointestinal:  Negative for abdominal pain, constipation, diarrhea, nausea and vomiting.  Genitourinary:  Negative for bladder incontinence, difficulty urinating, dysuria, frequency,  hematuria and nocturia.   Musculoskeletal:  Negative for arthralgias, back pain, flank pain, myalgias and neck pain.  Skin:  Negative for itching and rash.  Neurological:  Negative for dizziness, headaches and numbness.  Hematological:  Does not bruise/bleed easily.  Psychiatric/Behavioral:  Positive for sleep disturbance. Negative for depression and suicidal ideas. The patient is not nervous/anxious.   All other systems reviewed and are negative.    VITALS:   Blood pressure 118/75, pulse 62, temperature 97.6 F (36.4 C), temperature source Tympanic, resp. rate 18, weight 156 lb 8.4 oz (71 kg), SpO2 100 %.  Wt Readings from Last 3 Encounters:  06/24/22 156 lb 8.4 oz (71 kg)  06/17/22 147 lb 11.3 oz (67 kg)  05/19/22 155 lb (70.3 kg)    Body mass index is 23.11 kg/m.  Performance status (ECOG): 1 - Symptomatic but completely ambulatory  PHYSICAL EXAM:   Physical Exam Vitals and nursing note reviewed. Exam conducted with a chaperone present.  Constitutional:      Appearance: Normal appearance.  Cardiovascular:     Rate and Rhythm: Normal rate and regular rhythm.     Pulses: Normal pulses.     Heart sounds: Normal heart sounds.  Pulmonary:     Effort: Pulmonary effort is normal.     Breath sounds: Normal breath sounds.  Abdominal:     Palpations: Abdomen is soft. There is no hepatomegaly, splenomegaly or mass.     Tenderness: There is no abdominal tenderness.  Musculoskeletal:     Right lower leg: No edema.     Left lower leg:  No edema.     Comments: Left upper extremity and dorsum of the hand swelling.  Lymphadenopathy:     Cervical: No cervical adenopathy.     Right cervical: No superficial, deep or posterior cervical adenopathy.    Left cervical: No superficial, deep or posterior cervical adenopathy.     Upper Body:     Right upper body: No supraclavicular or axillary adenopathy.     Left upper body: No supraclavicular or axillary adenopathy.  Neurological:      General: No focal deficit present.     Mental Status: He is alert and oriented to person, place, and time.  Psychiatric:        Mood and Affect: Mood normal.        Behavior: Behavior normal.     LABS:      Latest Ref Rng & Units 06/18/2022    4:25 AM 06/17/2022    5:06 AM 06/16/2022   10:33 PM  CBC  WBC 4.0 - 10.5 K/uL 6.8  7.2  6.8   Hemoglobin 13.0 - 17.0 g/dL 16.1  09.6  04.5   Hematocrit 39.0 - 52.0 % 34.7  35.0  36.2   Platelets 150 - 400 K/uL 253  248  263       Latest Ref Rng & Units 06/21/2022    4:19 AM 06/20/2022    6:21 AM 06/19/2022    6:18 AM  CMP  Glucose 70 - 99 mg/dL 90  83  92   BUN 8 - 23 mg/dL 58  68  83   Creatinine 0.61 - 1.24 mg/dL 4.09  8.11  9.14   Sodium 135 - 145 mmol/L 140  141  139   Potassium 3.5 - 5.1 mmol/L 4.6  4.7  5.0   Chloride 98 - 111 mmol/L 111  111  109   CO2 22 - 32 mmol/L 21  21  21    Calcium 8.9 - 10.3 mg/dL 6.7  6.8  6.9   Total Protein 6.5 - 8.1 g/dL 5.9  6.1  6.4   Total Bilirubin 0.3 - 1.2 mg/dL 0.6  0.6  0.6   Alkaline Phos 38 - 126 U/L 46  52  56   AST 15 - 41 U/L 26  31  38   ALT 0 - 44 U/L 35  45  51      No results found for: "CEA1", "CEA" / No results found for: "CEA1", "CEA" No results found for: "PSA1" No results found for: "NWG956" No results found for: "CAN125"  No results found for: "TOTALPROTELP", "ALBUMINELP", "A1GS", "A2GS", "BETS", "BETA2SER", "GAMS", "MSPIKE", "SPEI" No results found for: "TIBC", "FERRITIN", "IRONPCTSAT" No results found for: "LDH"   STUDIES:   US RENAL  Result Date: 06/17/2022 CLINICAL DATA:  [id number] AKI (acute kidney injury) [id number] EXAM: RENAL / URINARY TRACT ULTRASOUND COMPLETE COMPARISON:  None Available. FINDINGS: The right kidney measured 10.2 cm and the left kidney measured 10.4 cm. The kidneys demonstrate increased echogenicity consistent with chronic medical renal disease. No renal parenchymal lesions are identified. No shadowing stones are seen. No hydronephrosis identified.  Urinary bladder wall thickening and focal nodularity raises concern for possible neoplastic process. Cystoscopic correlation should be considered. Bladder volumes were not measured. IMPRESSION: 1. Echogenic kidneys consistent with chronic medical renal disease. 2. Urinary bladder wall thickening and focal nodularity concerning for possible neoplastic etiology. Correlation with cystoscopy should be considered. Electronically Signed   By: Layla Maw M.D.   On: 06/17/2022 15:29  DG Chest 1 View  Result Date: 06/16/2022 CLINICAL DATA:  Altered mental status. History of lung cancer. EXAM: CHEST  1 VIEW COMPARISON:  Radiograph 05/19/2022. PET CT 07/16/2021 FINDINGS: Tip of the right chest port in the SVC. Again seen hyperinflation and emphysema. The apical pulmonary nodules on prior PET CT are not well-defined by radiograph. The heart is normal in size. There is no focal airspace disease, pleural effusion, or pneumothorax. No acute osseous abnormalities are seen. IMPRESSION: 1. No acute findings. 2. Chronic hyperinflation and emphysema. Electronically Signed   By: Narda Rutherford M.D.   On: 06/16/2022 22:27

## 2022-06-30 ENCOUNTER — Ambulatory Visit: Payer: 59 | Admitting: Urology

## 2022-07-01 ENCOUNTER — Ambulatory Visit (HOSPITAL_COMMUNITY)
Admission: RE | Admit: 2022-07-01 | Discharge: 2022-07-01 | Disposition: A | Discharge status: Home / Self Care | Payer: 59 | Source: Ambulatory Visit | Attending: Hematology | Admitting: Hematology

## 2022-07-01 ENCOUNTER — Encounter (HOSPITAL_COMMUNITY)
Admission: RE | Admit: 2022-07-01 | Discharge: 2022-07-01 | Disposition: A | Discharge status: Home / Self Care | Payer: 59 | Source: Ambulatory Visit | Attending: Hematology | Admitting: Hematology

## 2022-07-01 DIAGNOSIS — C3492 Malignant neoplasm of unspecified part of left bronchus or lung: Secondary | ICD-10-CM | POA: Diagnosis present

## 2022-07-01 MED ORDER — FLUDEOXYGLUCOSE F - 18 (FDG) INJECTION
7.8200 | Freq: Once | INTRAVENOUS | Status: AC | PRN
  Administered 2022-07-01: 7.82 via INTRAVENOUS

## 2022-07-06 ENCOUNTER — Ambulatory Visit (HOSPITAL_COMMUNITY)
Admission: RE | Admit: 2022-07-06 | Discharge: 2022-07-06 | Disposition: A | Discharge status: Home / Self Care | Payer: 59 | Source: Ambulatory Visit | Attending: Hematology | Admitting: Hematology

## 2022-07-06 DIAGNOSIS — C3492 Malignant neoplasm of unspecified part of left bronchus or lung: Secondary | ICD-10-CM | POA: Diagnosis not present

## 2022-07-06 MED ORDER — GADOBUTROL 1 MMOL/ML IV SOLN
3.5000 mL | Freq: Once | INTRAVENOUS | Status: AC | PRN
  Administered 2022-07-06: 3.5 mL via INTRAVENOUS

## 2022-07-12 ENCOUNTER — Inpatient Hospital Stay: Payer: 59 | Attending: Hematology | Admitting: Hematology

## 2022-07-19 ENCOUNTER — Inpatient Hospital Stay: Payer: 59 | Admitting: Hematology

## 2022-07-30 ENCOUNTER — Encounter: Payer: Self-pay | Admitting: *Deleted

## 2022-08-05 ENCOUNTER — Inpatient Hospital Stay: Payer: 59 | Attending: Hematology | Admitting: Hematology

## 2022-08-05 ENCOUNTER — Telehealth: Payer: Self-pay | Admitting: *Deleted

## 2022-08-05 NOTE — Telephone Encounter (Signed)
Received call from mother stating that she is unable to get him to his appointment today.  States he is combative and confused.  Called requiring assistance.  Advised to call 911 and have ambulance take him to the hospital for evaluation.  She states that he is exhibiting abusive behavior and she may have to call police as well.

## 2022-08-07 ENCOUNTER — Inpatient Hospital Stay (HOSPITAL_COMMUNITY)
Admission: EM | Admit: 2022-08-07 | Discharge: 2022-08-10 | DRG: 641 | Disposition: A | Discharge status: Home Health | Payer: 59 | Attending: Internal Medicine | Admitting: Internal Medicine

## 2022-08-07 ENCOUNTER — Other Ambulatory Visit: Payer: Self-pay

## 2022-08-07 ENCOUNTER — Encounter (HOSPITAL_COMMUNITY): Payer: Self-pay | Admitting: Family Medicine

## 2022-08-07 ENCOUNTER — Emergency Department (HOSPITAL_COMMUNITY): Payer: 59

## 2022-08-07 DIAGNOSIS — Z79890 Hormone replacement therapy (postmenopausal): Secondary | ICD-10-CM

## 2022-08-07 DIAGNOSIS — Z8249 Family history of ischemic heart disease and other diseases of the circulatory system: Secondary | ICD-10-CM

## 2022-08-07 DIAGNOSIS — E875 Hyperkalemia: Secondary | ICD-10-CM | POA: Insufficient documentation

## 2022-08-07 DIAGNOSIS — D638 Anemia in other chronic diseases classified elsewhere: Secondary | ICD-10-CM | POA: Diagnosis present

## 2022-08-07 DIAGNOSIS — J45909 Unspecified asthma, uncomplicated: Secondary | ICD-10-CM | POA: Diagnosis present

## 2022-08-07 DIAGNOSIS — Z28310 Unvaccinated for covid-19: Secondary | ICD-10-CM

## 2022-08-07 DIAGNOSIS — Z515 Encounter for palliative care: Secondary | ICD-10-CM

## 2022-08-07 DIAGNOSIS — E860 Dehydration: Secondary | ICD-10-CM | POA: Diagnosis not present

## 2022-08-07 DIAGNOSIS — N184 Chronic kidney disease, stage 4 (severe): Secondary | ICD-10-CM | POA: Diagnosis present

## 2022-08-07 DIAGNOSIS — Z87891 Personal history of nicotine dependence: Secondary | ICD-10-CM

## 2022-08-07 DIAGNOSIS — E440 Moderate protein-calorie malnutrition: Secondary | ICD-10-CM | POA: Insufficient documentation

## 2022-08-07 DIAGNOSIS — R64 Cachexia: Secondary | ICD-10-CM | POA: Diagnosis present

## 2022-08-07 DIAGNOSIS — Z923 Personal history of irradiation: Secondary | ICD-10-CM

## 2022-08-07 DIAGNOSIS — Z5986 Financial insecurity: Secondary | ICD-10-CM

## 2022-08-07 DIAGNOSIS — Z634 Disappearance and death of family member: Secondary | ICD-10-CM

## 2022-08-07 DIAGNOSIS — E890 Postprocedural hypothyroidism: Secondary | ICD-10-CM | POA: Diagnosis present

## 2022-08-07 DIAGNOSIS — I959 Hypotension, unspecified: Secondary | ICD-10-CM | POA: Diagnosis present

## 2022-08-07 DIAGNOSIS — E039 Hypothyroidism, unspecified: Secondary | ICD-10-CM | POA: Diagnosis present

## 2022-08-07 DIAGNOSIS — R54 Age-related physical debility: Secondary | ICD-10-CM | POA: Diagnosis present

## 2022-08-07 DIAGNOSIS — R627 Adult failure to thrive: Secondary | ICD-10-CM | POA: Diagnosis present

## 2022-08-07 DIAGNOSIS — N179 Acute kidney failure, unspecified: Secondary | ICD-10-CM | POA: Diagnosis present

## 2022-08-07 DIAGNOSIS — Z91199 Patient's noncompliance with other medical treatment and regimen due to unspecified reason: Secondary | ICD-10-CM

## 2022-08-07 DIAGNOSIS — D649 Anemia, unspecified: Secondary | ICD-10-CM | POA: Insufficient documentation

## 2022-08-07 DIAGNOSIS — Z6823 Body mass index (BMI) 23.0-23.9, adult: Secondary | ICD-10-CM

## 2022-08-07 DIAGNOSIS — Z9221 Personal history of antineoplastic chemotherapy: Secondary | ICD-10-CM

## 2022-08-07 DIAGNOSIS — C3490 Malignant neoplasm of unspecified part of unspecified bronchus or lung: Secondary | ICD-10-CM | POA: Diagnosis present

## 2022-08-07 DIAGNOSIS — Z681 Body mass index (BMI) 19 or less, adult: Secondary | ICD-10-CM

## 2022-08-07 DIAGNOSIS — Z635 Disruption of family by separation and divorce: Secondary | ICD-10-CM

## 2022-08-07 LAB — CBC WITH DIFFERENTIAL/PLATELET
Abs Immature Granulocytes: 0.04 10*3/uL (ref 0.00–0.07)
Basophils Absolute: 0 10*3/uL (ref 0.0–0.1)
Basophils Relative: 0 %
Eosinophils Absolute: 0.1 10*3/uL (ref 0.0–0.5)
Eosinophils Relative: 1 %
HCT: 32.9 % — ABNORMAL LOW (ref 39.0–52.0)
Hemoglobin: 10.6 g/dL — ABNORMAL LOW (ref 13.0–17.0)
Immature Granulocytes: 1 %
Lymphocytes Relative: 10 %
Lymphs Abs: 0.8 10*3/uL (ref 0.7–4.0)
MCH: 31.3 pg (ref 26.0–34.0)
MCHC: 32.2 g/dL (ref 30.0–36.0)
MCV: 97.1 fL (ref 80.0–100.0)
Monocytes Absolute: 0.9 10*3/uL (ref 0.1–1.0)
Monocytes Relative: 11 %
Neutro Abs: 5.9 10*3/uL (ref 1.7–7.7)
Neutrophils Relative %: 77 %
Platelets: 311 10*3/uL (ref 150–400)
RBC: 3.39 MIL/uL — ABNORMAL LOW (ref 4.22–5.81)
RDW: 13.2 % (ref 11.5–15.5)
WBC: 7.8 10*3/uL (ref 4.0–10.5)
nRBC: 0 % (ref 0.0–0.2)

## 2022-08-07 LAB — BASIC METABOLIC PANEL
Anion gap: 15 (ref 5–15)
BUN: 139 mg/dL — ABNORMAL HIGH (ref 8–23)
CO2: 17 mmol/L — ABNORMAL LOW (ref 22–32)
Calcium: 8.8 mg/dL — ABNORMAL LOW (ref 8.9–10.3)
Chloride: 103 mmol/L (ref 98–111)
Creatinine, Ser: 4.61 mg/dL — ABNORMAL HIGH (ref 0.61–1.24)
GFR, Estimated: 13 mL/min — ABNORMAL LOW (ref >60)
Glucose, Bld: 115 mg/dL — ABNORMAL HIGH (ref 70–99)
Potassium: 5.6 mmol/L — ABNORMAL HIGH (ref 3.5–5.1)
Sodium: 135 mmol/L (ref 135–145)

## 2022-08-07 LAB — URINALYSIS, ROUTINE W REFLEX MICROSCOPIC
Bacteria, UA: NONE SEEN
Bilirubin Urine: NEGATIVE
Glucose, UA: NEGATIVE mg/dL
Ketones, ur: NEGATIVE mg/dL
Leukocytes,Ua: NEGATIVE
Nitrite: NEGATIVE
Protein, ur: NEGATIVE mg/dL
Specific Gravity, Urine: 1.01 (ref 1.005–1.030)
pH: 5 (ref 5.0–8.0)

## 2022-08-07 LAB — COMPREHENSIVE METABOLIC PANEL
ALT: 26 U/L (ref 0–44)
AST: 13 U/L — ABNORMAL LOW (ref 15–41)
Albumin: 2.8 g/dL — ABNORMAL LOW (ref 3.5–5.0)
Alkaline Phosphatase: 54 U/L (ref 38–126)
Anion gap: 14 (ref 5–15)
BUN: 140 mg/dL — ABNORMAL HIGH (ref 8–23)
CO2: 19 mmol/L — ABNORMAL LOW (ref 22–32)
Calcium: 9.1 mg/dL (ref 8.9–10.3)
Chloride: 100 mmol/L (ref 98–111)
Creatinine, Ser: 4.81 mg/dL — ABNORMAL HIGH (ref 0.61–1.24)
GFR, Estimated: 13 mL/min — ABNORMAL LOW (ref >60)
Glucose, Bld: 117 mg/dL — ABNORMAL HIGH (ref 70–99)
Potassium: 5.3 mmol/L — ABNORMAL HIGH (ref 3.5–5.1)
Sodium: 133 mmol/L — ABNORMAL LOW (ref 135–145)
Total Bilirubin: 0.5 mg/dL (ref 0.3–1.2)
Total Protein: 7.1 g/dL (ref 6.5–8.1)

## 2022-08-07 LAB — LACTIC ACID, PLASMA
Lactic Acid, Venous: 0.9 mmol/L (ref 0.5–1.9)
Lactic Acid, Venous: 0.8 mmol/L (ref 0.5–1.9)

## 2022-08-07 LAB — TROPONIN I (HIGH SENSITIVITY): Troponin I (High Sensitivity): 6 ng/L (ref <18)

## 2022-08-07 MED ORDER — HEPARIN SODIUM (PORCINE) 5000 UNIT/ML IJ SOLN
5000.0000 [IU] | Freq: Three times a day (TID) | INTRAMUSCULAR | Status: DC
  Administered 2022-08-08 – 2022-08-10 (×7): 5000 [IU] via SUBCUTANEOUS
  Filled 2022-08-07 (×7): qty 1

## 2022-08-07 MED ORDER — ONDANSETRON HCL 4 MG/2ML IJ SOLN: 4.0000 mg | Freq: Four times a day (QID) | INTRAMUSCULAR | Status: DC | PRN

## 2022-08-07 MED ORDER — LACTATED RINGERS IV BOLUS
1000.0000 mL | Freq: Once | INTRAVENOUS | Status: AC
  Administered 2022-08-07: 1000 mL via INTRAVENOUS

## 2022-08-07 MED ORDER — OXYCODONE HCL 5 MG PO TABS: 5.0000 mg | ORAL_TABLET | ORAL | Status: DC | PRN

## 2022-08-07 MED ORDER — ONDANSETRON HCL 4 MG PO TABS: 4.0000 mg | ORAL_TABLET | Freq: Four times a day (QID) | ORAL | Status: DC | PRN

## 2022-08-07 MED ORDER — LEVOTHYROXINE SODIUM 50 MCG PO TABS: 250.0000 ug | ORAL_TABLET | Freq: Every day | ORAL | Status: DC

## 2022-08-07 MED ORDER — SODIUM CHLORIDE 0.9 % IV BOLUS
1000.0000 mL | Freq: Once | INTRAVENOUS | Status: AC
  Administered 2022-08-07: 1000 mL via INTRAVENOUS

## 2022-08-07 MED ORDER — ACETAMINOPHEN 325 MG PO TABS: 650.0000 mg | ORAL_TABLET | Freq: Four times a day (QID) | ORAL | Status: DC | PRN

## 2022-08-07 MED ORDER — ACETAMINOPHEN 650 MG RE SUPP: 650.0000 mg | Freq: Four times a day (QID) | RECTAL | Status: DC | PRN

## 2022-08-07 MED ORDER — SODIUM CHLORIDE 0.9 % IV SOLN: INTRAVENOUS | Status: DC

## 2022-08-07 NOTE — ED Notes (Signed)
Lactic acid and blood cultures being drawn by lab tech at this time

## 2022-08-07 NOTE — ED Triage Notes (Signed)
Pt here for generalized weakness, currently being treated for lung CA- getting chemo tx, pt has had decreased appetite.

## 2022-08-07 NOTE — ED Provider Notes (Signed)
EMERGENCY DEPARTMENT AT [facility] Provider Note   CSN: [id number] Arrival date & time: 08/07/22  1930     History  Chief Complaint  Patient presents with   generalized weakness    Decreased appetite- current chemo for lung CA   Generalized weakness    Miguel Colon is a 67 y.o. male with a history including small cell lung cancer, migraine headache, asthma and thyroid disease, chronic renal insufficiency presenting for evaluation of generalized weakness and poor appetite over the past week.  He has been under the care of Dr. Ellin Saba, apparently was lost to follow-up after receiving several rounds of radiation in 2022, has reestablish care in April 2024 after he was admitted here secondary to acute kidney injury and dehydration.  He reports generalized weakness and lightheadedness with standing.  He also endorses poor appetite stating he has had no p.o. intake since yesterday.  He denies fevers or chills, chest pain, nausea or vomiting and abdominal pain.  He does endorse shortness of breath without cough or fever.  Pt arrived from home where he lives by himself.  He states his mother from [place] called an ambulance as she knew he wasn't doing well.   The history is provided by the patient.       Home Medications Prior to Admission medications   Medication Sig Start Date End Date Taking? Authorizing Provider  levothyroxine (SYNTHROID) 125 MCG tablet Take 2 tablets (250 mcg total) by mouth daily before breakfast. 06/21/22  Yes Joseph Art, DO      Allergies    Patient has no known allergies.    Review of Systems   Review of Systems  Constitutional:  Positive for activity change, appetite change and fatigue. Negative for fever.  HENT:  Negative for congestion and sore throat.   Eyes: Negative.   Respiratory:  Positive for shortness of breath. Negative for cough, chest tightness and wheezing.   Cardiovascular:  Negative for chest pain.   Gastrointestinal:  Negative for abdominal pain, nausea and vomiting.  Genitourinary: Negative.   Musculoskeletal:  Negative for arthralgias, joint swelling and neck pain.  Skin: Negative.  Negative for rash and wound.  Neurological:  Positive for weakness. Negative for dizziness, light-headedness, numbness and headaches.  Psychiatric/Behavioral: Negative.      Physical Exam Updated Vital Signs BP 106/67   Pulse 73   Temp 97.6 F (36.4 C) (Oral)   Resp (!) 21   Ht 5\' 9"  (1.753 m)   Wt 71 kg   SpO2 100%   BMI 23.11 kg/m  Physical Exam Vitals and nursing note reviewed.  Constitutional:      Appearance: He is cachectic. He is not toxic-appearing.     Comments: Appears chronically ill, cachectic.  HENT:     Head: Normocephalic and atraumatic.  Eyes:     Conjunctiva/sclera: Conjunctivae normal.  Cardiovascular:     Rate and Rhythm: Normal rate and regular rhythm.     Heart sounds: Normal heart sounds.  Pulmonary:     Effort: Pulmonary effort is normal.     Breath sounds: Normal breath sounds. No wheezing.  Abdominal:     General: Bowel sounds are normal.     Palpations: Abdomen is soft.     Tenderness: There is no abdominal tenderness.  Musculoskeletal:        General: Normal range of motion.     Cervical back: Normal range of motion.  Skin:    General: Skin is warm and  dry.  Neurological:     Mental Status: He is alert.  Psychiatric:        Behavior: Behavior is cooperative.     ED Results / Procedures / Treatments   Labs (all labs ordered are listed, but only abnormal results are displayed) Labs Reviewed  CBC WITH DIFFERENTIAL/PLATELET - Abnormal; Notable for the following components:      Result Value   RBC 3.39 (*)    Hemoglobin 10.6 (*)    HCT 32.9 (*)    All other components within normal limits  COMPREHENSIVE METABOLIC PANEL - Abnormal; Notable for the following components:   Sodium 133 (*)    Potassium 5.3 (*)    CO2 19 (*)    Glucose, Bld 117 (*)     BUN 140 (*)    Creatinine, Ser 4.81 (*)    Albumin 2.8 (*)    AST 13 (*)    GFR, Estimated 13 (*)    All other components within normal limits  CULTURE, BLOOD (ROUTINE X 2)  CULTURE, BLOOD (ROUTINE X 2)  LACTIC ACID, PLASMA  LACTIC ACID, PLASMA  URINALYSIS, ROUTINE W REFLEX MICROSCOPIC  BASIC METABOLIC PANEL  TROPONIN I (HIGH SENSITIVITY)    EKG EKG Interpretation  Date/Time:  Saturday August 07 2022 19:40:34 EDT Ventricular Rate:  73 PR Interval:  135 QRS Duration: 127 QT Interval:  374 QTC Calculation: 413 R Axis:   84 Text Interpretation: Sinus rhythm Left ventricular hypertrophy Baseline wander in lead(s) V5 Confirmed by Bethann Berkshire [phone]) on 08/07/2022 10:35:40 PM  Radiology DG Chest Portable 1 View  Result Date: 08/07/2022 CLINICAL DATA:  Shortness of breath and weakness. EXAM: PORTABLE CHEST 1 VIEW COMPARISON:  June 16, 2022 and May 19, 2022 FINDINGS: There is stable right-sided venous Port-A-Cath positioning. The heart size and mediastinal contours are within normal limits. The lungs are hyperinflated. There is no evidence of acute infiltrate, pleural effusion or pneumothorax. A stable 8 mm, ill-defined nodular opacity is seen overlying the left lung base. Multilevel degenerative changes are seen throughout the thoracic spine. IMPRESSION: Stable exam without acute cardiopulmonary disease. Electronically Signed   By: Aram Candela M.D.   On: 08/07/2022 21:49    Procedures Procedures    Medications Ordered in ED Medications  lactated ringers bolus 1,000 mL (has no administration in time range)  sodium chloride 0.9 % bolus 1,000 mL (1,000 mLs Intravenous New Bag/Given 08/07/22 2034)    ED Course/ Medical Decision Making/ A&P                             Medical Decision Making Patient presenting with reduced appetite, generalized weakness, clinically and by lab results he is significantly dehydrated.   Amount and/or Complexity of Data Reviewed Labs:  ordered.    Details: Significant labs including a BUN of 140, creatinine of 4.81, his creatinine is relatively stable with creatinines ranging from 3.93 to over 8 over the past month.  He does have a significant potassium today at 5.3.  No significant EKG changes given this hyperkalemia. ECG/medicine tests: ordered.    Details: Rate 73, LVH, ISVD.  Discussion of management or test interpretation with external provider(s): Patient discussed with hospitalist Dr. Carren Rang who accepts pt for admission.  Risk Decision regarding hospitalization.           Final Clinical Impression(s) / ED Diagnoses Final diagnoses:  Dehydration  Acute renal failure, unspecified acute renal failure type (HCC)  Rx / DC Orders ED Discharge Orders     None         Victoriano Lain 08/07/22 2240    Bethann Berkshire, MD 08/10/22 1057

## 2022-08-08 ENCOUNTER — Encounter (HOSPITAL_COMMUNITY): Payer: Self-pay | Admitting: Family Medicine

## 2022-08-08 DIAGNOSIS — Z515 Encounter for palliative care: Secondary | ICD-10-CM | POA: Diagnosis not present

## 2022-08-08 DIAGNOSIS — R64 Cachexia: Secondary | ICD-10-CM | POA: Diagnosis present

## 2022-08-08 DIAGNOSIS — E039 Hypothyroidism, unspecified: Secondary | ICD-10-CM | POA: Diagnosis not present

## 2022-08-08 DIAGNOSIS — E890 Postprocedural hypothyroidism: Secondary | ICD-10-CM | POA: Diagnosis present

## 2022-08-08 DIAGNOSIS — Z923 Personal history of irradiation: Secondary | ICD-10-CM | POA: Diagnosis not present

## 2022-08-08 DIAGNOSIS — E440 Moderate protein-calorie malnutrition: Secondary | ICD-10-CM

## 2022-08-08 DIAGNOSIS — C3490 Malignant neoplasm of unspecified part of unspecified bronchus or lung: Secondary | ICD-10-CM | POA: Diagnosis present

## 2022-08-08 DIAGNOSIS — R627 Adult failure to thrive: Secondary | ICD-10-CM | POA: Diagnosis present

## 2022-08-08 DIAGNOSIS — Z5986 Financial insecurity: Secondary | ICD-10-CM | POA: Diagnosis not present

## 2022-08-08 DIAGNOSIS — Z681 Body mass index (BMI) 19 or less, adult: Secondary | ICD-10-CM | POA: Diagnosis not present

## 2022-08-08 DIAGNOSIS — N184 Chronic kidney disease, stage 4 (severe): Secondary | ICD-10-CM

## 2022-08-08 DIAGNOSIS — Z7189 Other specified counseling: Secondary | ICD-10-CM | POA: Diagnosis not present

## 2022-08-08 DIAGNOSIS — Z28310 Unvaccinated for covid-19: Secondary | ICD-10-CM | POA: Diagnosis not present

## 2022-08-08 DIAGNOSIS — N179 Acute kidney failure, unspecified: Secondary | ICD-10-CM

## 2022-08-08 DIAGNOSIS — Z87891 Personal history of nicotine dependence: Secondary | ICD-10-CM | POA: Diagnosis not present

## 2022-08-08 DIAGNOSIS — Z634 Disappearance and death of family member: Secondary | ICD-10-CM | POA: Diagnosis not present

## 2022-08-08 DIAGNOSIS — E875 Hyperkalemia: Secondary | ICD-10-CM

## 2022-08-08 DIAGNOSIS — Z8249 Family history of ischemic heart disease and other diseases of the circulatory system: Secondary | ICD-10-CM | POA: Diagnosis not present

## 2022-08-08 DIAGNOSIS — R54 Age-related physical debility: Secondary | ICD-10-CM | POA: Diagnosis present

## 2022-08-08 DIAGNOSIS — Z635 Disruption of family by separation and divorce: Secondary | ICD-10-CM | POA: Diagnosis not present

## 2022-08-08 DIAGNOSIS — Z79890 Hormone replacement therapy (postmenopausal): Secondary | ICD-10-CM | POA: Diagnosis not present

## 2022-08-08 DIAGNOSIS — D649 Anemia, unspecified: Secondary | ICD-10-CM

## 2022-08-08 DIAGNOSIS — I959 Hypotension, unspecified: Secondary | ICD-10-CM | POA: Diagnosis present

## 2022-08-08 DIAGNOSIS — E860 Dehydration: Secondary | ICD-10-CM | POA: Diagnosis present

## 2022-08-08 DIAGNOSIS — J45909 Unspecified asthma, uncomplicated: Secondary | ICD-10-CM | POA: Diagnosis present

## 2022-08-08 DIAGNOSIS — Z9221 Personal history of antineoplastic chemotherapy: Secondary | ICD-10-CM | POA: Diagnosis not present

## 2022-08-08 DIAGNOSIS — D638 Anemia in other chronic diseases classified elsewhere: Secondary | ICD-10-CM | POA: Diagnosis present

## 2022-08-08 LAB — CBC WITH DIFFERENTIAL/PLATELET
Abs Immature Granulocytes: 0.03 10*3/uL (ref 0.00–0.07)
Basophils Absolute: 0 10*3/uL (ref 0.0–0.1)
Basophils Relative: 0 %
Eosinophils Absolute: 0.3 10*3/uL (ref 0.0–0.5)
Eosinophils Relative: 3 %
HCT: 30.4 % — ABNORMAL LOW (ref 39.0–52.0)
Hemoglobin: 10.1 g/dL — ABNORMAL LOW (ref 13.0–17.0)
Immature Granulocytes: 0 %
Lymphocytes Relative: 17 %
Lymphs Abs: 1.3 10*3/uL (ref 0.7–4.0)
MCH: 32.3 pg (ref 26.0–34.0)
MCHC: 33.2 g/dL (ref 30.0–36.0)
MCV: 97.1 fL (ref 80.0–100.0)
Monocytes Absolute: 0.8 10*3/uL (ref 0.1–1.0)
Monocytes Relative: 11 %
Neutro Abs: 5.1 10*3/uL (ref 1.7–7.7)
Neutrophils Relative %: 69 %
Platelets: 313 10*3/uL (ref 150–400)
RBC: 3.13 MIL/uL — ABNORMAL LOW (ref 4.22–5.81)
RDW: 13.2 % (ref 11.5–15.5)
WBC: 7.5 10*3/uL (ref 4.0–10.5)
nRBC: 0 % (ref 0.0–0.2)

## 2022-08-08 LAB — COMPREHENSIVE METABOLIC PANEL
ALT: 24 U/L (ref 0–44)
AST: 13 U/L — ABNORMAL LOW (ref 15–41)
Albumin: 2.5 g/dL — ABNORMAL LOW (ref 3.5–5.0)
Alkaline Phosphatase: 50 U/L (ref 38–126)
Anion gap: 14 (ref 5–15)
BUN: 135 mg/dL — ABNORMAL HIGH (ref 8–23)
CO2: 18 mmol/L — ABNORMAL LOW (ref 22–32)
Calcium: 8.6 mg/dL — ABNORMAL LOW (ref 8.9–10.3)
Chloride: 105 mmol/L (ref 98–111)
Creatinine, Ser: 4.12 mg/dL — ABNORMAL HIGH (ref 0.61–1.24)
GFR, Estimated: 15 mL/min — ABNORMAL LOW (ref >60)
Glucose, Bld: 104 mg/dL — ABNORMAL HIGH (ref 70–99)
Potassium: 5.1 mmol/L (ref 3.5–5.1)
Sodium: 137 mmol/L (ref 135–145)
Total Bilirubin: 0.5 mg/dL (ref 0.3–1.2)
Total Protein: 6.4 g/dL — ABNORMAL LOW (ref 6.5–8.1)

## 2022-08-08 LAB — IRON AND TIBC
Iron: 55 ug/dL (ref 45–182)
Saturation Ratios: 39 % (ref 17.9–39.5)
TIBC: 143 ug/dL — ABNORMAL LOW (ref 250–450)
UIBC: 88 ug/dL

## 2022-08-08 LAB — RETICULOCYTES
Immature Retic Fract: 5.2 % (ref 2.3–15.9)
RBC.: 3.15 MIL/uL — ABNORMAL LOW (ref 4.22–5.81)
Retic Count, Absolute: 17.6 10*3/uL — ABNORMAL LOW (ref 19.0–186.0)
Retic Ct Pct: 0.6 % (ref 0.4–3.1)

## 2022-08-08 LAB — OCCULT BLOOD X 1 CARD TO LAB, STOOL: Fecal Occult Bld: NEGATIVE

## 2022-08-08 LAB — MAGNESIUM: Magnesium: 2.5 mg/dL — ABNORMAL HIGH (ref 1.7–2.4)

## 2022-08-08 LAB — TSH: TSH: 0.073 u[IU]/mL — ABNORMAL LOW (ref 0.350–4.500)

## 2022-08-08 LAB — FOLATE: Folate: 19.6 ng/mL (ref >5.9)

## 2022-08-08 LAB — VITAMIN B12: Vitamin B-12: 538 pg/mL (ref 180–914)

## 2022-08-08 LAB — FERRITIN: Ferritin: 1326 ng/mL — ABNORMAL HIGH (ref 24–336)

## 2022-08-08 MED ORDER — ENSURE ENLIVE PO LIQD
237.0000 mL | Freq: Two times a day (BID) | ORAL | Status: DC
  Administered 2022-08-08 – 2022-08-10 (×4): 237 mL via ORAL

## 2022-08-08 MED ORDER — SODIUM BICARBONATE 650 MG PO TABS
650.0000 mg | ORAL_TABLET | Freq: Two times a day (BID) | ORAL | Status: DC
  Administered 2022-08-08 – 2022-08-10 (×5): 650 mg via ORAL
  Filled 2022-08-08 (×5): qty 1

## 2022-08-08 MED ORDER — LEVOTHYROXINE SODIUM 75 MCG PO TABS
150.0000 ug | ORAL_TABLET | Freq: Every day | ORAL | Status: DC
  Administered 2022-08-09 – 2022-08-10 (×2): 150 ug via ORAL
  Filled 2022-08-08: qty 1
  Filled 2022-08-08: qty 2

## 2022-08-08 NOTE — Assessment & Plan Note (Signed)
-  albumin is 2.8 -Body mass index is 18.53 kg/m. -Patient has been encouraged to increase oral intake -Continue the use of feeding supplements.

## 2022-08-08 NOTE — Assessment & Plan Note (Addendum)
-  In the setting of decreased oral intake and prerenal azotemia -Renal function improving with fluid resuscitation -Continue to follow trend -Continue to minimize nephrotoxic agents. -Patient advised to maintain adequate oral intake.

## 2022-08-08 NOTE — H&P (Signed)
History and Physical    Patient: Miguel Colon WUJ:[id number] DOB: [birth year] DOA: 08/07/2022 DOS: the patient was seen and examined on 08/08/2022 PCP: [name], FNP  Patient coming from: Home  Chief Complaint:  Chief Complaint  Patient presents with   generalized weakness    Decreased appetite- current chemo for lung CA   Generalized weakness   HPI: Miguel Colon is a 67 y.o. male with medical history significant of hypothyroidism, lung cancer, former smoker, presents to the ED with a chief complaint of being disoriented and having decreased p.o. intake.  Patient reports that he is disoriented but he is alert and oriented to person and place at this time.  He is also able to provide his history.  He does think it is the year 2004 though.  Patient reports that 1 week ago he started to feel disoriented.  He had a decrease in appetite as well.  He reports that his last full meal was approximately a week ago.  His last bowel movement was approximately 3 days ago.  He has had no blood in his stool and no melena.  Patient reports he has been nauseous but he has not had any vomiting.  His last chemo was in April.  The ED provider actually spoke with Dr. Kirtland Bouchard who reports that they are preparing to discharge this patient from clinic for being lost to follow-up after starting treatment for lung cancer on 2 separate occasions.  Patient denies any fevers.  He denies dysuria and hematuria.  He reports no change in his urine output.  He reports generalized weakness, but no asymmetric weakness.  No numbness.  Patient has no other complaints at this time.  Patient reports he quit smoking approximately a month ago.  He does not drink alcohol.  He is not vaccinated for COVID or flu per his report.  Patient is full code. Review of Systems: As mentioned in the history of present illness. All other systems reviewed and are negative. Past Medical History:  Diagnosis Date   Asthma    Back pain    Lung cancer (HCC)     Migraines    Thyroid disease    Past Surgical History:  Procedure Laterality Date   IR IMAGING GUIDED PORT INSERTION  09/30/2020   THYROIDECTOMY     Social History:  reports that he quit smoking about 23 months ago. His smoking use included cigarettes. He has a 23.50 pack-year smoking history. He has never used smokeless tobacco. He reports that he does not drink alcohol and does not use drugs.  No Known Allergies  Family History  Problem Relation Age of Onset   Hypertension Mother    Hypertension Father    Colon cancer Father 15    Prior to Admission medications   Medication Sig Start Date End Date Taking? Authorizing Provider  levothyroxine (SYNTHROID) 125 MCG tablet Take 2 tablets (250 mcg total) by mouth daily before breakfast. 06/21/22  Yes Joseph Art, DO    Physical Exam: Vitals:   08/07/22 2000 08/07/22 2045 08/07/22 2329 08/07/22 2333  BP: (!) 81/62 106/67  109/72  Pulse: 72 73  76  Resp:  (!) 21  16  Temp:    97.7 F (36.5 C)  TempSrc:    Oral  SpO2: 97% 100%  100%  Weight:   55.3 kg   Height:   5\' 8"  (1.727 m)    1.  General: Patient thin, lying supine in bed,  no acute distress  2. Psychiatric: Alert and oriented x person and place as well as contacts, mood and behavior normal for situation, pleasant and cooperative with exam   3. Neurologic: Speech and language are normal, face is symmetric, moves all 4 extremities voluntarily, at baseline without acute deficits on limited exam   4. HEENMT:  Head is atraumatic, normocephalic, pupils reactive to light, neck is supple, trachea is midline, mucous membranes are moist   5. Respiratory : Lungs are clear to auscultation bilaterally without wheezing, rhonchi, rales, no cyanosis, no increase in work of breathing or accessory muscle use   6. Cardiovascular : Heart rate normal, rhythm is regular, no murmurs, rubs or gallops, no peripheral edema, peripheral pulses palpated   7. Gastrointestinal:  Abdomen is  soft, nondistended, nontender to palpation bowel sounds active, no masses or organomegaly palpated   8. Skin:  Skin is warm, dry and intact without rashes, acute lesions, or ulcers on limited exam   9.Musculoskeletal:  No acute deformities or trauma, no asymmetry in tone, no peripheral edema, peripheral pulses palpated, no tenderness to palpation in the extremities  Data Reviewed: In the ED Temp 97.6, heart rate 72-77, respiratory rate 20-21, blood pressure as low as 81/61-up to 106/67, satting 96-100% on room air Trope 6 Lactic acid 0.9 on 0.8 No leukocytosis with a white blood cell count of 7.8 Hemoglobin dropped to 10.6 from 11.6 in April Chemistry reveals a hyperkalemia at 5.3, decreased bicarb at 19, elevated BUN and 140, elevated creatinine at 4.81 Blood culture pending Chest x-ray is stable without acute cardiopulmonary disease EKG shows a heart rate of 73, sinus rhythm, QTc 413 Chart review reveals that there was a telephone encounter where mother was complaining of patient being combative and confused 2 days ago.  She was advised to come to the ER.  There is no ER chart from that day. Patient received 2 L bolus in the ED Admission requested for dehydration with AKI  Assessment and Plan: * Dehydration -BUN:Cr is 140: 4.8 -Poor PO intake -2L bolus -Continue IVF -Initially hypotensive as well -BP stabilized -Continue to monitor  Hyperkalemia -K+ 5.3 -2/2 AKI / dehydration -2 L Bolus in ED -Recheck K+ pending -Monitor on tele   Normocytic anemia -Hgb 10.6 -down from 11.6 a couple of months ago -Anemia panel -very high BUN, FOBT with next BM -Trend with AM labs  Protein-calorie malnutrition, moderate (HCC) -albumin is 2.8 -2/2 to decreased appetite/poor PO intake -Continue protein shakes -Encourage PO intake -Continue to monitor  AKI (acute kidney injury) (HCC) -Cr 4.8, up from 3.9 -2/2 dehydration -2L Bolus in ED -Continue IVF -Repeat Cr  pending -Continue to monitor  Acquired hypothyroidism -Check TSH -Continue synthroid      Advance Care Planning:   Code Status: Full Code  Consults: None at this time  Family Communication: No family at bedside  Severity of Illness: The appropriate patient status for this patient is OBSERVATION. Observation status is judged to be reasonable and necessary in order to provide the required intensity of service to ensure the patient's safety. The patient's presenting symptoms, physical exam findings, and initial radiographic and laboratory data in the context of their medical condition is felt to place them at decreased risk for further clinical deterioration. Furthermore, it is anticipated that the patient will be medically stable for discharge from the hospital within 2 midnights of admission.   Author: Lilyan Gilford, DO 08/08/2022 1:58 AM  For on call review [email or web address].

## 2022-08-08 NOTE — Assessment & Plan Note (Addendum)
-  Cr 4.8, up from 3.9 -2/2 dehydration -2L Bolus in ED -Continue IVF -Repeat Cr pending -Continue to monitor

## 2022-08-08 NOTE — Assessment & Plan Note (Addendum)
-  Hgb 10.6 -Anemia of chronic disease in the setting of underlying malignancy and renal dysfunction -Continue to follow hemoglobin trend. -No signs of overt bleeding.

## 2022-08-08 NOTE — TOC Initial Note (Signed)
Transition of Care [facility]) - Initial/Assessment Note    Patient Details  Name: Miguel Colon MRN: [id number] Date of Birth: 1955/04/25  [facility] of [facility]) CM/SW Contact:    Catalina Gravel, LCSW Phone Number: 08/08/2022, 10:52 AM  Clinical Narrative:                 Pt dx Lung Cancer, recently inactive with tx.  Palliative Consult requested by MD.DC 1-2 days.TOC to follow.    Barriers to Discharge: Continued Medical Work up   Patient Goals and CMS Choice            Expected Discharge Plan and Services                                              Prior Living Arrangements/Services                       Activities of Daily Living Home Assistive Devices/Equipment: None ADL Screening (condition at time of admission) Patient's cognitive ability adequate to safely complete daily activities?: Yes Is the patient deaf or have difficulty hearing?: No Does the patient have difficulty seeing, even when wearing glasses/contacts?: No Does the patient have difficulty concentrating, remembering, or making decisions?: No Patient able to express need for assistance with ADLs?: Yes Does the patient have difficulty dressing or bathing?: No Independently performs ADLs?: Yes (appropriate for developmental age) Does the patient have difficulty walking or climbing stairs?: No Weakness of Legs: None Weakness of Arms/Hands: None  Permission Sought/Granted                  Emotional Assessment              Admission diagnosis:  Dehydration [E86.0] Acute renal failure, unspecified acute renal failure type (HCC) [N17.9] Patient Active Problem List   Diagnosis Date Noted   Protein-calorie malnutrition, moderate (HCC) 08/07/2022   Normocytic anemia 08/07/2022   Hyperkalemia 08/07/2022   Acute kidney injury superimposed on CKD (HCC) 06/17/2022   Substance abuse (HCC) 06/17/2022   Transaminitis 06/17/2022   Dehydration 06/17/2022   Tobacco abuse  06/17/2022   Altered mental status 06/17/2022   AKI (acute kidney injury) (HCC) 06/17/2022   Neutropenia, drug-induced (HCC) 10/27/2020   Small cell lung cancer, left (HCC) 09/17/2020   Cancer associated pain 09/17/2020   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   Palliative care patient 09/09/2020   Pancoast syndrome, left (HCC) 09/02/2020   Constipation 12/27/2018   Lung nodules 12/27/2018   Loss of weight 12/27/2018   Rectal bleeding 12/27/2018   Chronic back pain 10/22/2014   Hypercholesteremia 10/22/2014   Acquired hypothyroidism 10/22/2014   Bulging lumbar disc 09/24/2014   Renal failure 09/24/2014   PCP:  [name], FNP Pharmacy:   [facility] - [place], [place] - [address] [place] [address] Phone: [phone] Fax: [phone]     Social Determinants of Health (SDOH) Social History: SDOH Screenings   Food Insecurity: No Food Insecurity (08/07/2022)  Housing: Low Risk  (08/07/2022)  Transportation Needs: No Transportation Needs (08/07/2022)  Utilities: Not At Risk (08/07/2022)  Alcohol Screen: Low Risk  (09/16/2020)  Depression (PHQ2-9): Low Risk  (09/16/2020)  Financial Resource Strain: Medium Risk (09/16/2020)  Physical Activity: Insufficiently Active (09/16/2020)  Social Connections: Socially Isolated (09/16/2020)  Stress: No Stress Concern Present (09/16/2020)  Tobacco Use: Medium Risk (08/08/2022)   SDOH Interventions:     Readmission Risk Interventions     No data to display

## 2022-08-08 NOTE — Progress Notes (Signed)
Patient seen and examined; admitted after midnight secondary to decreased oral intake, failure to thrive and dehydration.  Patient with underlying history of small cell lung cancer currently on chemotherapy (but experiencing significant lack of compliance), per EDP discussion with Dr. Kirtland Bouchard (oncology) patient at the verge of being dismissed from clinic for being lost to follow-up after starting chemotherapy.  Patient demonstrating worsening renal function, dehydration and significant elevation in his BUN.  Admission requested for fluid resuscitation and electrolyte stabilization.  Hemodynamically stable at the moment.  Please refer to H&P written by Dr.Zierle-Ghosh on 08/08/22 for further info/details on admission.   Plan: -Continue fluid resuscitation and supportive care -Palliative care service has been consulted for goals of care discussion and advance care planning. -Follow electrolytes and renal function trend.  Vassie Loll MD [phone]

## 2022-08-08 NOTE — Assessment & Plan Note (Signed)
Check TSH. Continue synthroid.  °

## 2022-08-08 NOTE — Assessment & Plan Note (Signed)
-  K+ 5.3 -2/2 AKI / dehydration -2 L Bolus in ED -Recheck K+ pending -Monitor on tele

## 2022-08-09 ENCOUNTER — Encounter (HOSPITAL_COMMUNITY): Payer: Self-pay | Admitting: Family Medicine

## 2022-08-09 DIAGNOSIS — Z515 Encounter for palliative care: Secondary | ICD-10-CM | POA: Diagnosis not present

## 2022-08-09 DIAGNOSIS — Z7189 Other specified counseling: Secondary | ICD-10-CM | POA: Diagnosis not present

## 2022-08-09 DIAGNOSIS — E440 Moderate protein-calorie malnutrition: Secondary | ICD-10-CM | POA: Diagnosis not present

## 2022-08-09 DIAGNOSIS — C3490 Malignant neoplasm of unspecified part of unspecified bronchus or lung: Secondary | ICD-10-CM | POA: Diagnosis present

## 2022-08-09 DIAGNOSIS — N179 Acute kidney failure, unspecified: Secondary | ICD-10-CM | POA: Diagnosis not present

## 2022-08-09 DIAGNOSIS — E860 Dehydration: Secondary | ICD-10-CM | POA: Diagnosis not present

## 2022-08-09 DIAGNOSIS — E039 Hypothyroidism, unspecified: Secondary | ICD-10-CM | POA: Diagnosis not present

## 2022-08-09 LAB — BASIC METABOLIC PANEL
Anion gap: 8 (ref 5–15)
BUN: 102 mg/dL — ABNORMAL HIGH (ref 8–23)
CO2: 20 mmol/L — ABNORMAL LOW (ref 22–32)
Calcium: 8.4 mg/dL — ABNORMAL LOW (ref 8.9–10.3)
Chloride: 111 mmol/L (ref 98–111)
Creatinine, Ser: 2.62 mg/dL — ABNORMAL HIGH (ref 0.61–1.24)
GFR, Estimated: 26 mL/min — ABNORMAL LOW (ref >60)
Glucose, Bld: 95 mg/dL (ref 70–99)
Potassium: 5 mmol/L (ref 3.5–5.1)
Sodium: 139 mmol/L (ref 135–145)

## 2022-08-09 NOTE — Progress Notes (Signed)
Progress Note   Patient: Miguel Colon ZOX:[id number] DOB: [birth year] DOA: 08/07/2022     1 DOS: the patient was seen and examined on 08/09/2022   Brief hospital admission narrative course: As per H&P written by Dr. Carren Rang on 08/08/22 [name] is a 67 y.o. male with medical history significant of hypothyroidism, lung cancer, former smoker, presents to the ED with a chief complaint of being disoriented and having decreased p.o. intake.  Patient reports that he is disoriented but he is alert and oriented to person and place at this time.  He is also able to provide his history.  He does think it is the year 2004 though.  Patient reports that 1 week ago he started to feel disoriented.  He had a decrease in appetite as well.  He reports that his last full meal was approximately a week ago.  His last bowel movement was approximately 3 days ago.  He has had no blood in his stool and no melena.  Patient reports he has been nauseous but he has not had any vomiting.  His last chemo was in April.  The ED provider actually spoke with Dr. Kirtland Bouchard who reports that they are preparing to discharge this patient from clinic for being lost to follow-up after starting treatment for lung cancer on 2 separate occasions.  Patient denies any fevers.  He denies dysuria and hematuria.  He reports no change in his urine output.  He reports generalized weakness, but no asymmetric weakness.  No numbness.  Patient has no other complaints at this time.   Patient reports he quit smoking approximately a month ago.  He does not drink alcohol.  He is not vaccinated for COVID or flu per his report.  Patient is full code.  Assessment and Plan: * Dehydration -In the setting of decreased oral intake and prerenal azotemia -Renal function improving with fluid resuscitation -Continue to follow trend -Continue to minimize nephrotoxic agents. -Patient advised to maintain adequate oral intake.  Small cell lung cancer (HCC) -Continue  outpatient follow-up with oncology service -He is supposed to be on chemotherapy but has been very noncompliant. -Palliative care for goals of care discussion and advance care planning has been requested.  Hyperkalemia -In the setting of dehydration and worsening renal function -Improve after fluid resuscitation -No EKG or telemetry normalities appreciated -Continue to follow trend.   Normocytic anemia -Hgb 10.6 -Anemia of chronic disease in the setting of underlying malignancy and renal dysfunction -Continue to follow hemoglobin trend. -No signs of overt bleeding.  Protein-calorie malnutrition, moderate (HCC) -albumin is 2.8 -Body mass index is 18.53 kg/m. -Patient has been encouraged to increase oral intake -Continue the use of feeding supplements.  AKI (acute kidney injury) (HCC) -Acute kidney injury on chronic renal failure stage IV  -Appears to be secondary to dehydration and prerenal azotemia -Continue fluid resuscitation -Continue to minimize nephrotoxic agents -Avoid hypotension and the use of contrast -Follow renal function trend.   Acquired hypothyroidism -TSH 0.073 -Synthroid dose has been adjusted -Reassess thyroid panel in 6 weeks.   Subjective:  Febrile, no chest pain, no nausea or vomiting.  Chronically ill in appearance and underweight.  Reports decreased appetite.  Physical Exam: Vitals:   08/08/22 1259 08/08/22 2026 08/09/22 0424 08/09/22 1215  BP: 131/78 115/74 123/74 121/75  Pulse:  80 75 75  Resp: 17 15 16 15   Temp: 97.8 F (36.6 C) 99.3 F (37.4 C) 98.7 F (37.1 C) 98.6 F (37 C)  TempSrc:  Oral Oral  Oral  SpO2: 100% 98% 98% 98%  Weight:      Height:       General exam: Alert, awake, fully oriented and following commands appropriately; chronically ill in appearance, deconditioned and demonstrating unbalanced gait while going to the bathroom. Respiratory system: Clear to auscultation. Respiratory effort normal.  Good saturation on room  air. Cardiovascular system:RRR. No rubs or gallops; no JVD. Gastrointestinal system: Abdomen is nondistended, soft and nontender. No organomegaly or masses felt. Normal bowel sounds heard. Central nervous system: Alert and oriented. No focal neurological deficits. Extremities: No cyanosis Skin: No petechiae. Psychiatry: Judgement and insight appear normal.  Flat affect appreciated.  Data Reviewed: Sodium 139, potassium 5.0, chloride 111, bicarb 20, glucose 95, BUN 102, creatinine 2.62 and GFR 26  Family Communication: No family at bedside.   Disposition: Status is: Inpatient Remains inpatient appropriate because: Continue IV fluid resuscitation and supportive care; renal function and electrolytes are improving.  Hopefully discharge home on 08/10/2022.   Planned Discharge Destination: Home; with home health services if patient agrees.   Time spent: 35 minutes  Author: Vassie Loll, MD 08/09/2022 1:12 PM  For on call review [email or web address].

## 2022-08-09 NOTE — Consult Note (Signed)
Consultation Note Date: 08/09/2022   Patient Name: Miguel Colon  DOB: Apr 18, 1955  MRN: [id number]  Age / Sex: 67 y.o., male  PCP: [name], FNP Referring Physician: Vassie Loll, MD  Reason for Consultation: Establishing goals of care  HPI/Patient Profile: 67 y.o. male  with past medical history of hypothyroidism, lung cancer diagnosed July 2022 followed with Dr. Ellin Saba, last seen 06/24/2022, former smoker, admitted on 08/07/2022 with dehydration, small cell lung cancer.   Clinical Assessment and Goals of Care: I have reviewed medical records including EPIC notes, labs and imaging, received report from RN, assessed the patient.  Miguel Colon is lying quietly in bed.  He appears acutely/chronically ill and quite frail.  Per bedside nursing staff he is oriented x 2.  He is able to tell me his name, that we are in the hospital, but not what city, and that it is June.  I believe that he can make his basic needs known.  There is no family at bedside at this time.  We meet at the bedside to discuss diagnosis prognosis, GOC, EOL wishes, disposition and options.  I introduced Palliative Medicine as specialized medical care for people living with serious illness. It focuses on providing relief from the symptoms and stress of a serious illness. The goal is to improve quality of life for both the patient and the family.  We discussed a brief life review of the patient.  Miguel Colon tells me that he is living independently.  He shares that he is independent with IADLs.  His mother is still living and he has 2 daughters Miguel Colon and [address]  He tells me that he also has a niece named Miguel Colon.   We then focused on their current illness.  We talked about his dehydration, poor functional status and frailty.  Miguel Colon tells me that he is following with oncology.  He tells me that he believes that he will be taking more cancer treatment.   We talked about going to short-term rehab to get some strength back.  At this point Miguel Colon is agreeable to rehab.  Miguel Colon would clearly benefit from having a relative present for further discussions.  The natural disease trajectory and expectations at EOL were discussed.  Advanced directives, concepts specific to code status, artifical feeding and hydration, and rehospitalization were considered and discussed.  We talked about the concept of "treat the treatable, but allow natural passing".  I encouraged Miguel Colon to consider what he would and would not want.  Call to mother, Miguel Colon.  No answer, unable to leave voicemail message.  Need contact information for daughters.  Hospice and Palliative Care services were not discussed today.  PMT working on relationship building.  Discussed the importance of continued conversation with family and the medical providers regarding overall plan of care and treatment options, ensuring decisions are within the context of the patient's values and GOCs.  Questions and concerns were addressed.   The family was encouraged to call with questions or concerns.  PMT will continue to support holistically.  Conference with attending, bedside nursing staff, transition of care team related to patient condition, needs, goals of care, disposition.    HCPOA  NEXT OF KIN -Mr. Show names his 2 daughters, Miguel Colon and Miguel Colon as his healthcare surrogates.    SUMMARY OF RECOMMENDATIONS   At this point continue full scope/full code PT evaluation for possible short-term rehab Unsure of plan from oncology standpoint Would benefit from outpatient palliative/hospice care.   Code Status/Advance Care Planning: Full code -we talk about the concept of "treat the treatable, but allowing natural passing.  I encourage Miguel Colon to consider what he does and does not want.  Symptom Management:  Per hospitalist, no additional needs at this time.  Palliative Prophylaxis:   Frequent Pain Assessment and Oral Care  Additional Recommendations (Limitations, Scope, Preferences): Full Scope Treatment  Psycho-social/Spiritual:  Desire for further Chaplaincy support:no Additional Recommendations: Caregiving  Support/Resources and Education on Hospice  Prognosis:  Unable to determine, based on outcomes.  6 months or less would not be surprising based on advancing cancer burden.  Discharge Planning: To Be Determined      Primary Diagnoses: Present on Admission:  Dehydration  AKI (acute kidney injury) (HCC)  Acquired hypothyroidism   I have reviewed the medical record, interviewed the patient and family, and examined the patient. The following aspects are pertinent.  Past Medical History:  Diagnosis Date   Asthma    Back pain    Lung cancer (HCC)    Migraines    Thyroid disease    Social History   Socioeconomic History   Marital status: Legally Separated    Spouse name: Not on file   Number of children: 3   Years of education: Not on file   Highest education level: Not on file  Occupational History   Occupation: Retired  Tobacco Use   Smoking status: Former    Packs/day: 0.50    Years: 47.00    Additional pack years: 0.00    Total pack years: 23.50    Types: Cigarettes    Quit date: 09/02/2020    Years since quitting: 1.9   Smokeless tobacco: Never  Vaping Use   Vaping Use: Never used  Substance and Sexual Activity   Alcohol use: No   Drug use: No   Sexual activity: Not Currently  Other Topics Concern   Not on file  Social History Narrative   Not on file   Social Determinants of Health   Financial Resource Strain: Medium Risk (09/16/2020)   Overall Financial Resource Strain (CARDIA)    Difficulty of Paying Living Expenses: Somewhat hard  Food Insecurity: No Food Insecurity (08/07/2022)   Hunger Vital Sign    Worried About Running Out of Food in the Last Year: Never true    Ran Out of Food in the Last Year: Never true   Transportation Needs: No Transportation Needs (08/07/2022)   PRAPARE - Administrator, Civil Service (Medical): No    Lack of Transportation (Non-Medical): No  Physical Activity: Insufficiently Active (09/16/2020)   Exercise Vital Sign    Days of Exercise per Week: 5 days    Minutes of Exercise per Session: 20 min  Stress: No Stress Concern Present (09/16/2020)   [employer] of Occupational Health - Occupational Stress Questionnaire    Feeling of Stress : Not at all  Social Connections: Socially Isolated (09/16/2020)   Social Connection and Isolation Panel [NHANES]    Frequency of Communication  with Friends and Family: Three times a week    Frequency of Social Gatherings with Friends and Family: Three times a week    Attends Religious Services: Never    Active Member of Clubs or Organizations: No    Attends Banker Meetings: Never    Marital Status: Separated   Family History  Problem Relation Age of Onset   Hypertension Mother    Hypertension Father    Colon cancer Father 62   Scheduled Meds:  feeding supplement  237 mL Oral BID BM   heparin  5,000 Units Subcutaneous Q8H   levothyroxine  150 mcg Oral Q0600   sodium bicarbonate  650 mg Oral BID   Continuous Infusions:  sodium chloride 125 mL/hr at 08/08/22 2028   PRN Meds:.acetaminophen **OR** acetaminophen, ondansetron **OR** ondansetron (ZOFRAN) IV, oxyCODONE Medications Prior to Admission:  Prior to Admission medications   Medication Sig Start Date End Date Taking? Authorizing Provider  levothyroxine (SYNTHROID) 125 MCG tablet Take 2 tablets (250 mcg total) by mouth daily before breakfast. 06/21/22  Yes Vann, Jessica U, DO   No Known Allergies Review of Systems  Unable to perform ROS: Acuity of condition    Physical Exam Vitals and nursing note reviewed.  Constitutional:      General: He is not in acute distress.    Appearance: He is ill-appearing.  Cardiovascular:     Rate and Rhythm:  Normal rate.  Pulmonary:     Effort: Pulmonary effort is normal. No respiratory distress.  Neurological:     Mental Status: He is alert.     Comments: Oriented to self, place but not city, month  Psychiatric:        Mood and Affect: Mood normal.        Behavior: Behavior normal.     Vital Signs: BP 121/75 (BP Location: Right Arm)   Pulse 75   Temp 98.6 F (37 C) (Oral)   Resp 15   Ht 5\' 8"  (1.727 m)   Wt 55.3 kg   SpO2 98%   BMI 18.53 kg/m  Pain Scale: 0-10   Pain Score: 0-No pain   SpO2: SpO2: 98 % O2 Device:SpO2: 98 % O2 Flow Rate: .   IO: Intake/output summary:  Intake/Output Summary (Last 24 hours) at 08/09/2022 1328 Last data filed at 08/09/2022 1137 Gross per 24 hour  Intake 1742.02 ml  Output 3000 ml  Net -1257.98 ml    LBM: Last BM Date : 08/05/22 Baseline Weight: Weight: 71 kg Most recent weight: Weight: 55.3 kg     Palliative Assessment/Data:     Time In: 1030  Time Out: 1145 Time Total: 75 minutes  Greater than 50%  of this time was spent counseling and coordinating care related to the above assessment and plan.  Signed by: Katheran Awe, NP   Please contact Palliative Medicine Team phone at [phone] for questions and concerns.  For individual provider: See Loretha Stapler

## 2022-08-09 NOTE — TOC Initial Note (Signed)
Transition of Care [facility]) - Initial/Assessment Note    Patient Details  Name: Miguel Colon MRN: [id number] Date of Birth: 02/18/1956  Transition of Care [facility]) CM/SW Contact:    Leitha Bleak, RN Phone Number: 08/09/2022, 3:47 PM  Clinical Narrative:      Patient admitted with dehydration. Patient has a high risk for readmission. Palliative consulted.  Patient is oriented to person and place. He has 2 daughters. He was independent with ADLs prior to this hospitalization. PT eval is pending. Patient is agreeable to SNF. TOC following.           Expected Discharge Plan: Skilled Nursing Facility Barriers to Discharge: Continued Medical Work up   Patient Goals and CMS Choice Patient states their goals for this hospitalization and ongoing recovery are:: agreeable to SNF CMS Medicare.gov Compare Post Acute Care list provided to:: Patient       Prior Living Arrangements/Services   Lives with:: Self          Need for Family Participation in Patient Care: Yes (Comment) Care giver support system in place?: Yes (comment)   Criminal Activity/Legal Involvement Pertinent to Current Situation/Hospitalization: No - Comment as needed  Activities of Daily Living Home Assistive Devices/Equipment: None ADL Screening (condition at time of admission) Patient's cognitive ability adequate to safely complete daily activities?: Yes Is the patient deaf or have difficulty hearing?: No Does the patient have difficulty seeing, even when wearing glasses/contacts?: No Does the patient have difficulty concentrating, remembering, or making decisions?: No Patient able to express need for assistance with ADLs?: Yes Does the patient have difficulty dressing or bathing?: No Independently performs ADLs?: Yes (appropriate for developmental age) Does the patient have difficulty walking or climbing stairs?: No Weakness of Legs: None Weakness of Arms/Hands: None  Permission Sought/Granted      Emotional  Assessment     Affect (typically observed): Accepting Orientation: : Oriented to Self, Oriented to Place, Oriented to Situation Alcohol / Substance Use: Not Applicable Psych Involvement: No (comment)  Admission diagnosis:  Dehydration [E86.0] Acute renal failure, unspecified acute renal failure type (HCC) [N17.9] Patient Active Problem List   Diagnosis Date Noted   Small cell lung cancer (HCC) 08/09/2022   Protein-calorie malnutrition, moderate (HCC) 08/07/2022   Normocytic anemia 08/07/2022   Hyperkalemia 08/07/2022   Acute kidney injury superimposed on CKD (HCC) 06/17/2022   Substance abuse (HCC) 06/17/2022   Transaminitis 06/17/2022   Dehydration 06/17/2022   Tobacco abuse 06/17/2022   Altered mental status 06/17/2022   AKI (acute kidney injury) (HCC) 06/17/2022   Neutropenia, drug-induced (HCC) 10/27/2020   Small cell lung cancer, left (HCC) 09/17/2020   Cancer associated pain 09/17/2020   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 [place] [address] Phone: [phone] Fax: [phone]     Social Determinants of Health (SDOH) Social History: SDOH Screenings   Food Insecurity: No Food Insecurity (08/07/2022)  Housing: Low Risk  (08/07/2022)  Transportation Needs: No Transportation Needs (08/07/2022)  Utilities: Not At Risk (08/07/2022)  Alcohol Screen: Low Risk  (09/16/2020)  Depression (PHQ2-9): Low Risk  (09/16/2020)  Financial Resource Strain: Medium Risk (09/16/2020)  Physical Activity: Insufficiently  Active (09/16/2020)  Social Connections: Socially Isolated (09/16/2020)  Stress: No Stress Concern Present (09/16/2020)  Tobacco Use: Medium Risk (08/09/2022)   SDOH Interventions:

## 2022-08-09 NOTE — Assessment & Plan Note (Signed)
-  Continue outpatient follow-up with oncology service -He is supposed to be on chemotherapy but has been very noncompliant. -Palliative care for goals of care discussion and advance care planning has been requested.

## 2022-08-10 DIAGNOSIS — E875 Hyperkalemia: Secondary | ICD-10-CM | POA: Diagnosis not present

## 2022-08-10 DIAGNOSIS — I10 Essential (primary) hypertension: Secondary | ICD-10-CM

## 2022-08-10 DIAGNOSIS — C3490 Malignant neoplasm of unspecified part of unspecified bronchus or lung: Secondary | ICD-10-CM | POA: Diagnosis not present

## 2022-08-10 DIAGNOSIS — Z7189 Other specified counseling: Secondary | ICD-10-CM | POA: Diagnosis not present

## 2022-08-10 DIAGNOSIS — E860 Dehydration: Secondary | ICD-10-CM | POA: Diagnosis not present

## 2022-08-10 DIAGNOSIS — E440 Moderate protein-calorie malnutrition: Secondary | ICD-10-CM | POA: Diagnosis not present

## 2022-08-10 LAB — BASIC METABOLIC PANEL WITH GFR
Anion gap: 10 (ref 5–15)
BUN: 76 mg/dL — ABNORMAL HIGH (ref 8–23)
CO2: 19 mmol/L — ABNORMAL LOW (ref 22–32)
Calcium: 8.6 mg/dL — ABNORMAL LOW (ref 8.9–10.3)
Chloride: 115 mmol/L — ABNORMAL HIGH (ref 98–111)
Creatinine, Ser: 1.88 mg/dL — ABNORMAL HIGH (ref 0.61–1.24)
GFR, Estimated: 39 mL/min — ABNORMAL LOW
Glucose, Bld: 77 mg/dL (ref 70–99)
Potassium: 5.4 mmol/L — ABNORMAL HIGH (ref 3.5–5.1)
Sodium: 144 mmol/L (ref 135–145)

## 2022-08-10 LAB — CULTURE, BLOOD (ROUTINE X 2)
Culture: NO GROWTH
Culture: NO GROWTH
Special Requests: ADEQUATE

## 2022-08-10 MED ORDER — LEVOTHYROXINE SODIUM 150 MCG PO TABS: 150.0000 ug | ORAL_TABLET | Freq: Every day | ORAL | 1 refills | Status: AC

## 2022-08-10 MED ORDER — SODIUM BICARBONATE 650 MG PO TABS: 650.0000 mg | ORAL_TABLET | Freq: Two times a day (BID) | ORAL | 1 refills | Status: AC

## 2022-08-10 MED ORDER — ENSURE COMPLETE PO LIQD: 237.0000 mL | Freq: Two times a day (BID) | ORAL | Status: AC

## 2022-08-10 MED ORDER — HYDRALAZINE HCL 20 MG/ML IJ SOLN
10.0000 mg | Freq: Three times a day (TID) | INTRAMUSCULAR | Status: DC | PRN
  Administered 2022-08-10: 10 mg via INTRAVENOUS
  Filled 2022-08-10: qty 1

## 2022-08-10 MED ORDER — AMLODIPINE BESYLATE 2.5 MG PO TABS: 2.5000 mg | ORAL_TABLET | Freq: Every day | ORAL | 1 refills | Status: AC

## 2022-08-10 MED ORDER — VITAMIN B-12 1000 MCG PO TABS: 1000.0000 ug | ORAL_TABLET | Freq: Every day | ORAL | 1 refills | Status: AC

## 2022-08-10 MED ORDER — SODIUM ZIRCONIUM CYCLOSILICATE 5 G PO PACK
5.0000 g | PACK | Freq: Once | ORAL | Status: AC
  Administered 2022-08-10: 5 g via ORAL
  Filled 2022-08-10: qty 1

## 2022-08-10 NOTE — Evaluation (Signed)
Physical Therapy Evaluation Patient Details Name: Miguel Colon MRN: [id number] DOB: December 17, 1955 Today's Date: 08/10/2022  History of Present Illness  As per H&P written by Dr. Carren Rang on 08/08/22  Miguel Colon is a 68 y.o. male with medical history significant of hypothyroidism, lung cancer, former smoker, presents to the ED with a chief complaint of being disoriented and having decreased p.o. intake.  Patient reports that he is disoriented but he is alert and oriented to person and place at this time.  He is also able to provide his history.  He does think it is the year 2004 though.  Patient reports that 1 week ago he started to feel disoriented.  He had a decrease in appetite as well.  He reports that his last full meal was approximately a week ago.  His last bowel movement was approximately 3 days ago.  He has had no blood in his stool and no melena.  Patient reports he has been nauseous but he has not had any vomiting.  His last chemo was in April.  The ED provider actually spoke with Dr. Kirtland Bouchard who reports that they are preparing to discharge this patient from clinic for being lost to follow-up after starting treatment for lung cancer on 2 separate occasions.  Patient denies any fevers.  He denies dysuria and hematuria.  He reports no change in his urine output.  He reports generalized weakness, but no asymmetric weakness.  No numbness.  Patient has no other complaints at this time.     Patient reports he quit smoking approximately a month ago.  He does not drink alcohol.  He is not vaccinated for COVID or flu per his report.  Patient is full code.   Clinical Impression  Pt admitted with above diagnosis. Patient modified independent for bed mobility. Patient somewhat unsteady upon standing from chair and tends to hold on to hold on to objects to steady himself and tends to drift left and right during ambulation. Trial of using SPC for transfers and ambulation. Patient more steady during transfers and  ambulation and was able to use righting reactions and SPC to independently maintain his balance with slight drifting left and right during ambulation. Pt currently with functional limitations due to the deficits listed below (see PT Problem List). Pt will benefit from acute skilled PT to increase their independence and safety with mobility to allow discharge.          Recommendations for follow up therapy are one component of a multi-disciplinary discharge planning process, led by the attending physician.  Recommendations may be updated based on patient status, additional functional criteria and insurance authorization.  Follow Up Recommendations  Home Health Physical Therapy     Assistance Recommended at Discharge Intermittent Supervision/Assistance  Patient can return home with the following  A little help with walking and/or transfers;A little help with bathing/dressing/bathroom;Assistance with cooking/housework;Assist for transportation    Equipment Recommendations Cane  Recommendations for Other Services       Functional Status Assessment Patient has had a recent decline in their functional status and demonstrates the ability to make significant improvements in function in a reasonable and predictable amount of time.     Precautions / Restrictions Precautions Precautions: Fall Precaution Comments: patient reports no falls in the last six months Restrictions Weight Bearing Restrictions: No      Mobility  Bed Mobility Overal bed mobility: Modified Independent   General bed mobility comments: increased time    Transfers Overall transfer level: Needs assistance  Equipment used: None, Straight cane Transfers: Sit to/from Stand, Bed to chair/wheelchair/BSC Sit to Stand: Min guard   Step pivot transfers: Recruitment consultant transfers: Supervision   General transfer comment: slightly unsteady upon standing    Ambulation/Gait Ambulation/Gait assistance: Min  guard Gait Distance (Feet): 90 Feet Assistive device: Straight cane, None Gait Pattern/deviations: Step-through pattern, Decreased step length - right, Decreased step length - left, Decreased stride length, Drifts right/left Gait velocity: decreased     General Gait Details: slow, slightly unsteady gait without assistive device; more steady with SPC but continues to drift slightly left and right and able to independent maintain balance with use of SPC and righting reactions; limited by fatigue; on room air throughout session  Stairs            Wheelchair Mobility    Modified Rankin (Stroke Patients Only)       Balance Overall balance assessment: Needs assistance Sitting-balance support: No upper extremity supported, Feet supported Sitting balance-Leahy Scale: Good     Standing balance support: During functional activity, Single extremity supported, Reliant on assistive device for balance Standing balance-Leahy Scale: Fair Standing balance comment: fair/poor without assistive device; fair with SPC         Pertinent Vitals/Pain Pain Assessment Pain Assessment: No/denies pain    Home Living Family/patient expects to be discharged to:: Private residence Living Arrangements: Alone Available Help at Discharge: Family;Friend(s) (neighbors) Type of Home: House Home Access: Level entry       Home Layout: Two level;Able to live on main level with bedroom/bathroom Home Equipment: None      Prior Function Prior Level of Function : Independent/Modified Independent;Driving             Mobility Comments: reports ambulation without assistive devices. ADLs Comments: modified independent for IADLs     Hand Dominance        Extremity/Trunk Assessment   Upper Extremity Assessment Upper Extremity Assessment: Generalized weakness    Lower Extremity Assessment Lower Extremity Assessment: Generalized weakness    Cervical / Trunk Assessment Cervical / Trunk  Assessment: Kyphotic  Communication   Communication: No difficulties  Cognition Arousal/Alertness: Awake/alert Behavior During Therapy: WFL for tasks assessed/performed Overall Cognitive Status: Within Functional Limits for tasks assessed          General Comments      Exercises     Assessment/Plan    PT Assessment Patient needs continued PT services  PT Problem List Decreased strength;Decreased balance;Decreased activity tolerance       PT Treatment Interventions DME instruction;Balance training;Patient/family education;Gait training;Therapeutic activities;Therapeutic exercise;Cognitive remediation    PT Goals (Current goals can be found in the Care Plan section)  Acute Rehab PT Goals Patient Stated Goal: Go home with help of [place] and HHPT. PT Goal Formulation: With patient Time For Goal Achievement: 08/24/22 Potential to Achieve Goals: Good    Frequency Min 3X/week        AM-PAC PT "6 Clicks" Mobility  Outcome Measure Help needed turning from your back to your side while in a flat bed without using bedrails?: None Help needed moving from lying on your back to sitting on the side of a flat bed without using bedrails?: None Help needed moving to and from a bed to a chair (including a wheelchair)?: A Little Help needed standing up from a chair using your arms (e.g., wheelchair or bedside chair)?: A Little Help needed to walk in hospital room?: A Little Help needed climbing 3-5 steps with a  railing? : A Little 6 Click Score: 20    End of Session Equipment Utilized During Treatment: Gait belt Activity Tolerance: Patient tolerated treatment well;Patient limited by fatigue Patient left: in chair;with chair alarm set;with call bell/phone within reach Nurse Communication: Mobility status PT Visit Diagnosis: Unsteadiness on feet (R26.81);Other abnormalities of gait and mobility (R26.89);Muscle weakness (generalized) (M62.81)    Time: 9604-5409 PT Time Calculation  (min) (ACUTE ONLY): 28 min   Charges:   PT Evaluation $PT Eval Low Complexity: 1 Low PT Treatments $Therapeutic Activity: 8-22 mins        Katina Dung. Hartnett-Rands, MS, PT [facility] PT [facility] [place] [phone]  Britta Mccreedy  Hartnett-Rands 08/10/2022, 12:03 PM

## 2022-08-10 NOTE — Discharge Summary (Signed)
Physician Discharge Summary   Patient: Miguel Colon MRN: [id number] DOB: [birth year]  Admit date:     08/07/2022  Discharge date:   Discharge Physician: Vassie Loll   PCP: [name], FNP   Recommendations at discharge:  Reassess blood pressure and adjust antihypertensive treatment as needed Repeat basic metabolic panel to follow electrolytes and renal function Repeat CBC to follow hemoglobin trend/stability. Advance care planning and goals of care discussion recommended. Make sure patient follow-up with oncology service as instructed.  Discharge Diagnoses: Principal Problem:   Dehydration Active Problems:   Acquired hypothyroidism   AKI (acute kidney injury) (HCC)   Protein-calorie malnutrition, moderate (HCC)   Normocytic anemia   Hyperkalemia   Small cell lung cancer [facility])   Brief hospital admission narrative course: As per H&P written by Dr. Carren Rang on 08/08/22 Miguel Colon is a 67 y.o. male with medical history significant of hypothyroidism, lung cancer, former smoker, presents to the ED with a chief complaint of being disoriented and having decreased p.o. intake.  Patient reports that he is disoriented but he is alert and oriented to person and place at this time.  He is also able to provide his history.  He does think it is the year 2004 though.  Patient reports that 1 week ago he started to feel disoriented.  He had a decrease in appetite as well.  He reports that his last full meal was approximately a week ago.  His last bowel movement was approximately 3 days ago.  He has had no blood in his stool and no melena.  Patient reports he has been nauseous but he has not had any vomiting.  His last chemo was in April.  The ED provider actually spoke with Dr. Kirtland Bouchard who reports that they are preparing to discharge this patient from clinic for being lost to follow-up after starting treatment for lung cancer on 2 separate occasions.  Patient denies any fevers.  He denies dysuria and  hematuria.  He reports no change in his urine output.  He reports generalized weakness, but no asymmetric weakness.  No numbness.  Patient has no other complaints at this time.   Patient reports he quit smoking approximately a month ago.  He does not drink alcohol.  He is not vaccinated for COVID or flu per his report.  Patient is full code.  Assessment and Plan: * Dehydration -In the setting of decreased oral intake and prerenal azotemia -Renal function improving with fluid resuscitation -Continue to follow trend -Continue to minimize nephrotoxic agents. -Patient advised to maintain adequate oral intake.  Small cell lung cancer (HCC) -Continue outpatient follow-up with oncology service -He is supposed to be on chemotherapy but has been very noncompliant. -Palliative care for goals of care discussion and advance care planning has been requested.  Hyperkalemia -In the setting of dehydration and worsening renal function -Improve after fluid resuscitation -No EKG or telemetry normalities appreciated -Continue to follow trend.   Normocytic anemia -Hgb 10.6 -Anemia of chronic disease in the setting of underlying malignancy and renal dysfunction -Continue to follow hemoglobin trend. -No signs of overt bleeding.  Protein-calorie malnutrition, moderate (HCC) -albumin is 2.8 -Body mass index is 18.53 kg/m. -Patient has been encouraged to increase oral intake -Continue the use of feeding supplements.  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   Acquired hypothyroidism -TSH 0.073 -Synthroid dose has been adjusted -Reassess thyroid panel in 6 weeks.    Consultants: Palliative care Procedures performed: See below for x-ray  reports Disposition: Home with home health services Diet recommendation: Heart healthy diet.  DISCHARGE MEDICATION: Allergies as of 08/10/2022   No Known Allergies      Medication List     TAKE these medications    amLODipine 2.5 MG tablet Commonly known as: NORVASC Take 1 tablet (2.5 mg total) by mouth daily.   cyanocobalamin 1000 MCG tablet Commonly known as: VITAMIN B12 Take 1 tablet (1,000 mcg total) by mouth daily.   feeding supplement (ENSURE COMPLETE) Liqd Take 237 mLs by mouth 2 (two) times daily between meals.   levothyroxine 150 MCG tablet Commonly known as: SYNTHROID Take 1 tablet (150 mcg total) by mouth daily at 6 (six) AM. Start taking on: August 11, 2022 What changed:  medication strength how much to take when to take this   sodium bicarbonate 650 MG tablet Take 1 tablet (650 mg total) by mouth 2 (two) times daily.        Follow-up Information     [name], FNP. Schedule an appointment as soon as possible for a visit in 2 week(s).   Specialty: Family Medicine Contact information: [address] [place] [address] [phone]         Doreatha Massed, MD. Call.   Specialty: Hematology Why: Contact office for appointment details. Contact information: [address] [place] [address] [phone]                Discharge Exam: Miguel Colon Weights   08/07/22 1940 08/07/22 2329  Weight: 71 kg 55.3 kg   General exam: Alert, awake, oriented X2 and following commands appropriately; chronically ill in appearance, weak and underweight.  Overall reporting improvement and after evaluation by physical therapy found to be stable to go home with home health services. Respiratory system: Clear to auscultation. Respiratory effort normal.  Good saturation on room air. Cardiovascular system:RRR. No rubs or gallops; no JVD. Gastrointestinal system: Abdomen is nondistended, soft and nontender. No organomegaly or masses felt. Normal bowel  sounds heard. Central nervous system: No focal neurological deficits. Extremities: No cyanosis or clubbing. Skin: No petechiae. Psychiatry: Judgement and insight appear normal.  Flat affect appreciated.  Condition at discharge: Improved and in no acute distress.  The results of significant diagnostics from this hospitalization (including imaging, microbiology, ancillary and laboratory) are listed below for reference.   Imaging Studies: DG Chest Portable 1 View  Result Date: 08/07/2022 CLINICAL DATA:  Shortness of breath and weakness. EXAM: PORTABLE CHEST 1 VIEW COMPARISON:  June 16, 2022 and May 19, 2022 FINDINGS: There is stable right-sided venous Port-A-Cath positioning. The heart size and mediastinal contours are within normal limits. The lungs are hyperinflated. There is no evidence of acute infiltrate, pleural effusion or pneumothorax. A stable 8 mm, ill-defined nodular opacity is seen overlying the left lung base. Multilevel degenerative changes are seen throughout the thoracic spine. IMPRESSION: Stable exam without acute cardiopulmonary disease. Electronically Signed   By: Aram Candela M.D.   On: 08/07/2022 21:49    Microbiology: Results for orders placed or performed during the hospital encounter of 08/07/22  Blood culture (routine x 2)     Status: None (Preliminary result)   Collection Time: 08/07/22  9:12 PM   Specimen: BLOOD  Result Value Ref Range Status   Specimen Description  BLOOD BLOOD LEFT HAND  Final   Special Requests Blood Culture adequate volume  Final   Culture   Final    NO GROWTH 3 DAYS Performed at [facility], [address], [place], [place] [address]    Report Status PENDING  Incomplete  Blood culture (routine x 2)     Status: None (Preliminary result)   Collection Time: 08/07/22  9:12 PM   Specimen: BLOOD  Result Value Ref Range Status   Specimen Description BLOOD BLOOD RIGHT HAND  Final   Special Requests Blood Culture adequate volume  Final    Culture   Final    NO GROWTH 3 DAYS Performed at [facility], [address] [place], [place] [address]    Report Status PENDING  Incomplete    Labs: CBC: Recent Labs  Lab 08/07/22 2024 08/08/22 0407  WBC 7.8 7.5  NEUTROABS 5.9 5.1  HGB 10.6* 10.1*  HCT 32.9* 30.4*  MCV 97.1 97.1  PLT 311 313   Basic Metabolic Panel: Recent Labs  Lab 08/07/22 2024 08/07/22 2232 08/08/22 0407 08/09/22 0638 08/10/22 0423  NA 133* 135 137 139 144  K 5.3* 5.6* 5.1 5.0 5.4*  CL 100 103 105 111 115*  CO2 19* 17* 18* 20* 19*  GLUCOSE 117* 115* 104* 95 77  BUN 140* 139* 135* 102* 76*  CREATININE 4.81* 4.61* 4.12* 2.62* 1.88*  CALCIUM 9.1 8.8* 8.6* 8.4* 8.6*  MG  --   --  2.5*  --   --    Liver Function Tests: Recent Labs  Lab 08/07/22 2024 08/08/22 0407  AST 13* 13*  ALT 26 24  ALKPHOS 54 50  BILITOT 0.5 0.5  PROT 7.1 6.4*  ALBUMIN 2.8* 2.5*    Discharge time spent: greater than 30 minutes.  Signed: Vassie Loll, MD [facility] 08/10/2022

## 2022-08-10 NOTE — Progress Notes (Signed)
Pt bed linen and gown changed. Up 1 assist to bathroom. Current IV infiltrated, removed and replaced with new IV in LAC. Pt repositioned in bed and resting at this time.

## 2022-08-10 NOTE — TOC Progression Note (Signed)
Transition of Care [facility]) - Progression Note    Patient Details  Name: Miguel Colon MRN: [id number] Date of Birth: Nov 12, 1955  Transition of [facility]) CM/SW Contact  Leitha Bleak, RN Phone Number: 08/10/2022, 2:15 PM  Clinical Narrative:   PT is recommending HHPT. Palliative updated his niece. TOC following to set up at discharge. MD aware to order.   Expected Discharge Plan: Home w Home Health Services Barriers to Discharge: Continued Medical Work up  Expected Discharge Plan and Services      Social Determinants of Health (SDOH) Interventions SDOH Screenings   Food Insecurity: No Food Insecurity (08/07/2022)  Housing: Low Risk  (08/07/2022)  Transportation Needs: No Transportation Needs (08/07/2022)  Utilities: Not At Risk (08/07/2022)  Alcohol Screen: Low Risk  (09/16/2020)  Depression (PHQ2-9): Low Risk  (09/16/2020)  Financial Resource Strain: Medium Risk (09/16/2020)  Physical Activity: Insufficiently Active (09/16/2020)  Social Connections: Socially Isolated (09/16/2020)  Stress: No Stress Concern Present (09/16/2020)  Tobacco Use: Medium Risk (08/09/2022)    Readmission Risk Interventions     No data to display

## 2022-08-10 NOTE — Plan of Care (Signed)
  Problem: Acute Rehab PT Goals(only PT should resolve) Goal: Patient Will Transfer Sit To/From Stand Outcome: Progressing Flowsheets (Taken 08/10/2022 1206) Patient will transfer sit to/from stand: with supervision Goal: Pt Will Transfer Bed To Chair/Chair To Bed Outcome: Progressing Flowsheets (Taken 08/10/2022 1206) Pt will Transfer Bed to Chair/Chair to Bed: with supervision Goal: Pt Will Perform Standing Balance Or Pre-Gait Outcome: Progressing Flowsheets (Taken 08/10/2022 1206) Pt will perform standing balance or pre-gait:  3- 5 min  1-2 min  with min guard assist  with no UE support Goal: Pt Will Ambulate Outcome: Progressing Flowsheets (Taken 08/10/2022 1206) Pt will Ambulate:  100 feet  with supervision  with least restrictive assistive device Goal: Pt/caregiver will Perform Home Exercise Program Outcome: Progressing Flowsheets (Taken 08/10/2022 1206) Pt/caregiver will Perform Home Exercise Program:  For increased strengthening  For improved balance  With Supervision, verbal cues required/provided    Katina Dung. Hartnett-Rands, MS, PT [facility] Di[facility] PT [facility] [phone] 08/10/2022

## 2022-08-10 NOTE — Progress Notes (Signed)
Patient voided multiple times. Full lenen change and gown change multiple times from patient having incontinence along with urgency to urinate. Patient would not call out for help even after being showed to use call bell. Bed alarm on and side rail up this helped with preventing the patient from completely getting up with out supervision and prevent fall. Patient able to ambulate but does need help to maintain balance. Urinal used twice but patient prefers to get up and use toilet. No complaints of pain this shift. Continued to monitor patient.

## 2022-08-10 NOTE — Progress Notes (Signed)
Palliative: [name] is resting quietly in bed.  He greets me, making and somewhat keeping eye contact.  He appears acutely/chronically ill and quite frail.  He is alert and or handed, able to make his needs known.  Physical therapy is present at bedside for evaluation.  [name] tells me that he continues to be agreeable for short-term rehab if qualified.  We talked about follow-up with oncologist.  At this point he is unaware of a follow-up appointment.  He is agreeable to further follow-ups with oncology and treatment if offered.  We talk about following up with oncology after he is finished with rehab.  He is agreeable.  I asked [name] about his daughters who he names as his healthcare surrogates.  He shares that [employer] and [employer] [place].  He tells me that he does not have their contact information.  We talk about calling his mother, Miguel Colon.  He is agreeable.  He tells me that his neighbors Miguel Colon and Miguel Colon dropped by occasionally and are available to assist when needed.  Call to mother, Miguel Colon at [phone].  No answer, unable to leave voicemail message. Call to niece, Miguel Colon at [phone].  Miguel Colon tells me that she lives part-time in [place] but comes to [place] frequently.  She shares that she will frequently take her uncle Miguel Colon to his doctors appointments, going into the appointments with him.  She tells me that he was raised by his grandmother and because his mother by her name, Miguel Colon.  She shares that Miguel Colon is not very supportive or loving.    Miguel Colon tells me that during her recent visit, "he told me that he 'does not have cancer".  Miguel Colon shares that she did not want to press this with her uncle.  She shares that she has seen changes in him over the last few years, months in particular.  She shares that his grandfather (who raised him as a father) died a few years ago and this really affected Miguel Colon.   We talk about how to make choices for loved ones.  We talk about keeping Miguel Colon  at the center of decision-making, asking him what he does and does not want.  We talked about "are we doing something for him or to him, can we change what is happening".  We talked in detail about metastatic cancer burden and recent images, PET scan/MRI brain.  We talked about oncology visit in April and plan.  I share that it seems Miguel Colon has been qualified for home with home health.  We talk about the benefits of outpatient palliative services for further goals of care discussions.  Miguel Colon is agreeable.  Provider choice offered.  [facility].  We talk about the benefits of "treat the treatable" hospice care in the future.  We also talk about CODE STATUS, "treat the treatable, but allowing a natural passing".  Miguel Colon states that she has not held these discussions with her uncle Miguel Colon but she will.  She tells me that she had experience with her grandfather's passing.  Conference with attending, bedside nursing staff, transition of care team related to patient condition, needs, goals of care, disposition.  Plan: At this point agreeable to short-term rehab with the ultimate goal of returning home.  Agreeable to follow-up with oncology after rehab.   65 minutes, extended time   Miguel Carmel, NP Palliative medicine team Team phone [phone] Greater than 50% of this time was spent counseling and coordinating care related to  the above assessment and plan.

## 2022-08-12 LAB — CULTURE, BLOOD (ROUTINE X 2): Special Requests: ADEQUATE

## 2022-08-13 ENCOUNTER — Encounter: Payer: Self-pay | Admitting: Urology

## 2022-08-13 ENCOUNTER — Ambulatory Visit (INDEPENDENT_AMBULATORY_CARE_PROVIDER_SITE_OTHER): Payer: 59 | Admitting: Urology

## 2022-08-13 VITALS — BP 86/56 | HR 98 | Ht 69.0 in | Wt 121.9 lb

## 2022-08-13 DIAGNOSIS — N3289 Other specified disorders of bladder: Secondary | ICD-10-CM | POA: Diagnosis not present

## 2022-08-13 DIAGNOSIS — N401 Enlarged prostate with lower urinary tract symptoms: Secondary | ICD-10-CM

## 2022-08-13 DIAGNOSIS — N138 Other obstructive and reflux uropathy: Secondary | ICD-10-CM

## 2022-08-13 DIAGNOSIS — G8929 Other chronic pain: Secondary | ICD-10-CM

## 2022-08-13 LAB — BLADDER SCAN AMB NON-IMAGING

## 2022-08-13 MED ORDER — ALFUZOSIN HCL ER 10 MG PO TB24: 10.0000 mg | ORAL_TABLET | Freq: Every day | ORAL | 11 refills | Status: AC

## 2022-08-13 NOTE — Progress Notes (Signed)
08/13/2022 1:09 PM   Miguel Colon 01-03-1956 [id number]  Referring provider: [name], FNP [address] [place],  [place] [address]  BPH and abnormal bladder wall   HPI: Miguel Colon is a 85ON here for evaluation of BPH and a thickened bladder wall on renal US. IPSS 25 QOL 5 on no BPH therapy. He has a weak stream. He has straining to urinate. Nocturia 3-4x. He was found on renal US in 05/2022 to have a thickened bladder wall. He denies nay dysuria or hematuria.    PMH: Past Medical History:  Diagnosis Date   Asthma    Back pain    Lung cancer (HCC)    Migraines    Thyroid disease     Surgical History: Past Surgical History:  Procedure Laterality Date   IR IMAGING GUIDED PORT INSERTION  09/30/2020   THYROIDECTOMY      Home Medications:  Allergies as of 08/13/2022   No Known Allergies      Medication List        Accurate as of August 13, 2022  1:09 PM. If you have any questions, ask your nurse or doctor.          amLODipine 2.5 MG tablet Commonly known as: NORVASC Take 1 tablet (2.5 mg total) by mouth daily.   cyanocobalamin 1000 MCG tablet Commonly known as: VITAMIN B12 Take 1 tablet (1,000 mcg total) by mouth daily.   feeding supplement (ENSURE COMPLETE) Liqd Take 237 mLs by mouth 2 (two) times daily between meals.   levothyroxine 150 MCG tablet Commonly known as: SYNTHROID Take 1 tablet (150 mcg total) by mouth daily at 6 (six) AM.   sodium bicarbonate 650 MG tablet Take 1 tablet (650 mg total) by mouth 2 (two) times daily.        Allergies: No Known Allergies  Family History: Family History  Problem Relation Age of Onset   Hypertension Mother    Hypertension Father    Colon cancer Father 41    Social History:  reports that he quit smoking about 1 years ago. His smoking use included cigarettes. He has a 23.50 pack-year smoking history. He has never used smokeless tobacco. He reports that he does not drink alcohol and does not use  drugs.  ROS: All other review of systems were reviewed and are negative except what is noted above in HPI  Physical Exam: BP (!) 86/56   Pulse 98   Ht 5\' 9"  (1.753 m)   Wt 121 lb 14.4 oz (55.3 kg)   BMI 18.00 kg/m   Constitutional:  Alert and oriented, No acute distress. HEENT: [place] AT, moist mucus membranes.  Trachea midline, no masses. Cardiovascular: No clubbing, cyanosis, or edema. Respiratory: Normal respiratory effort, no increased work of breathing. GI: Abdomen is soft, nontender, nondistended, no abdominal masses GU: No CVA tenderness.  Lymph: No cervical or inguinal lymphadenopathy. Skin: No rashes, bruises or suspicious lesions. Neurologic: Grossly intact, no focal deficits, moving all 4 extremities. Psychiatric: Normal mood and affect.  Laboratory Data: Lab Results  Component Value Date   WBC 7.5 08/08/2022   HGB 10.1 (L) 08/08/2022   HCT 30.4 (L) 08/08/2022   MCV 97.1 08/08/2022   PLT 313 08/08/2022    Lab Results  Component Value Date   CREATININE 1.88 (H) 08/10/2022    No results found for: "PSA"  No results found for: "TESTOSTERONE"  Lab Results  Component Value Date   HGBA1C 6.2 09/24/2014  Urinalysis    Component Value Date/Time   COLORURINE YELLOW 08/07/2022 2342   APPEARANCEUR HAZY (A) 08/07/2022 2342   LABSPEC 1.010 08/07/2022 2342   PHURINE 5.0 08/07/2022 2342   GLUCOSEU NEGATIVE 08/07/2022 2342   HGBUR SMALL (A) 08/07/2022 2342   BILIRUBINUR NEGATIVE 08/07/2022 2342   KETONESUR NEGATIVE 08/07/2022 2342   PROTEINUR NEGATIVE 08/07/2022 2342   UROBILINOGEN 1.0 12/17/2014 0040   NITRITE NEGATIVE 08/07/2022 2342   LEUKOCYTESUR NEGATIVE 08/07/2022 2342    Lab Results  Component Value Date   BACTERIA NONE SEEN 08/07/2022    Pertinent Imaging: [place] 06/17/2022: Images reviewed and discussed with the patient No results found for this or any previous visit.  No results found for this or any previous visit.  No results found for  this or any previous visit.  No results found for this or any previous visit.  Results for orders placed during the hospital encounter of 06/16/22  US RENAL  Narrative CLINICAL DATA:  [id number] AKI (acute kidney injury) [id number]  EXAM: RENAL / URINARY TRACT ULTRASOUND COMPLETE  COMPARISON:  None Available.  FINDINGS: The right kidney measured 10.2 cm and the left kidney measured 10.4 cm. The kidneys demonstrate increased echogenicity consistent with chronic medical renal disease. No renal parenchymal lesions are identified. No shadowing stones are seen. No hydronephrosis identified.  Urinary bladder wall thickening and focal nodularity raises concern for possible neoplastic process. Cystoscopic correlation should be considered. Bladder volumes were not measured.  IMPRESSION: 1. Echogenic kidneys consistent with chronic medical renal disease. 2. Urinary bladder wall thickening and focal nodularity concerning for possible neoplastic etiology. Correlation with cystoscopy should be considered.   Electronically Signed By: Miguel Colon M.D. On: 06/17/2022 15:29  No valid procedures specified. No results found for this or any previous visit.  No results found for this or any previous visit.   Assessment & Plan:    1.  Benign prostatic hyperplasia with urinary obstruction -uroxatral 10mg  qhs  2. Bladder wall thickening -We will proceed with cystoscopy at next visit.    No follow-ups on file.  Wilkie Aye, MD  [facility]

## 2022-08-13 NOTE — Patient Instructions (Signed)

## 2022-08-17 ENCOUNTER — Encounter (HOSPITAL_COMMUNITY): Payer: Self-pay | Admitting: Hematology

## 2022-08-30 ENCOUNTER — Encounter: Payer: Self-pay | Admitting: *Deleted

## 2022-08-30 NOTE — Progress Notes (Addendum)
Patient's mother Miguel Colon called the clinic today requesting to speak with someone in charge.  I returned her call this afternoon.  She voiced her concerns about the letter of discharge from practice and she feels like he was dumped over just missing some appointments. She shared her concerns around him not being physically well and able to care for himself. She shares that they have no family here and she lives 300 miles away. She shares that they rely on neighbors and friends to bring him to and from appointments.  She shares that she did not know that he was missing his appointments.  I explained to Miguel Colon that we care about her son and we care about his wellbeing.  We did not dump him and his care.  I explained our policy and that he has received it in his discharge papers on multiple occasions.  He has also been verbally made aware of the policy as well as her when she called to make his follow up appt last month.  During that time she was also made aware that if he no showed he would be discharged from the clinic.  When We wrote Miguel Colon the letter of discharge, we offered to give him care for 30 days to allow him time to get his care established with another provider.  He failed to do this and is now requesting furthered care with our practice. I explained to Miguel Colon that we will be glad to forward his records to practice of her/his choice.  She would like for [place] to get those records to [facility] in [place].  I explained to her the importance of communication with any practice they attend.  They have to communicate the reason for no showing before missing the appt and to keep in mind that if he fails to follow that, he may end up in the same situation at another practice.  Miguel Colon shares other concerns with me about his ability to care for himself and that since he is alone and she lives so far away, she would like help getting him some sort of assistance either in a nursing home or assisted  living somewhere.  She states that she did call social services in [place] today to see if they could help her get assistance.    I have offered to Miguel Colon that we will get our Child psychotherapist involved and should we be able to provide assistance we will do so.  If not, we will pass off information to new provider's office and have their social worker follow up.  She voices her understanding and appreciation for my call.  I offered to Miguel Colon to file a formal complaint against the provider or this practice and she denies wanting to pursue this.  She states that she now understands the reasoning and just wishes that her son would take better care of himself so that he doesn't end up in situations like this.  She appreciates my call and appreciates me explaining things to her.  I gave her my call back number and advised that either myself or our social worker would be back in touch.

## 2022-08-31 ENCOUNTER — Telehealth: Payer: Self-pay

## 2022-08-31 ENCOUNTER — Encounter (HOSPITAL_COMMUNITY): Payer: Self-pay | Admitting: Hematology

## 2022-08-31 NOTE — Telephone Encounter (Signed)
Telephone call received from patient's mother, Clarisa Kindred, again regarding patient's dismissal from [facility]. Patient's mother tells me that she is not from this area, but she is traveling hundreds of miles in attempt to care for her son; therefore, she is unable to find him another provider. She requests the patient's medical records, for which I have provided her with the phone number to [facility] records. Patient's mother then reminded that per her conversation with Sedalia Muta, LCSW will be asked to reach out to her and assist her in further care of her son. Ms. Anne Hahn is satisfied with this response and will await their call.

## 2022-08-31 NOTE — Progress Notes (Signed)
LATE ENTRY: Call received from patient's mother, Clarisa Kindred, with questions regarding patient's dismissal from the practice letter. Resources given according to the letter to "call the local medical society or [facility] (referral service [phone])." Patient's mother expresses frustration regarding the patient's dismissal from the practice. Policy regarding no show reviewed. Patient's mother states that she will be reaching out to the referral service per the letter.

## 2022-09-01 ENCOUNTER — Encounter: Payer: Self-pay | Admitting: *Deleted

## 2022-09-01 NOTE — Progress Notes (Signed)
I spoke with patient's mother via telephone again today.  I advised her of her referral to [facility].  I encouraged her to evaluate her intentions and align them with her son's wishes for his treatment and his care.  I asked her to be sure that they agree to either be on the same page, or agree to disagree but allow the patient to decide on his care.  I talked with her in length about her desire to do what is right for him however, she has to let him make his decisions.  I advised her of his PCP phone number and she wrote it down.  She will reach out to them for assistance in home health as needed.  She is thankful for my help in getting his PCP number and for getting the referral to new cancer care team.

## 2022-09-23 ENCOUNTER — Ambulatory Visit (INDEPENDENT_AMBULATORY_CARE_PROVIDER_SITE_OTHER): Payer: 59 | Admitting: Urology

## 2022-09-23 ENCOUNTER — Encounter: Payer: Self-pay | Admitting: Urology

## 2022-09-23 VITALS — BP 106/70 | HR 45 | Temp 98.7°F

## 2022-09-23 DIAGNOSIS — N3289 Other specified disorders of bladder: Secondary | ICD-10-CM | POA: Diagnosis not present

## 2022-09-23 DIAGNOSIS — N138 Other obstructive and reflux uropathy: Secondary | ICD-10-CM | POA: Diagnosis not present

## 2022-09-23 DIAGNOSIS — N401 Enlarged prostate with lower urinary tract symptoms: Secondary | ICD-10-CM | POA: Diagnosis not present

## 2022-09-23 LAB — URINALYSIS, ROUTINE W REFLEX MICROSCOPIC
Bilirubin, UA: NEGATIVE
Glucose, UA: NEGATIVE
Ketones, UA: NEGATIVE
Leukocytes,UA: NEGATIVE
Nitrite, UA: NEGATIVE
Protein,UA: NEGATIVE
RBC, UA: NEGATIVE
Specific Gravity, UA: 1.025 (ref 1.005–1.030)
Urobilinogen, Ur: 0.2 mg/dL (ref 0.2–1.0)
pH, UA: 5.5 (ref 5.0–7.5)

## 2022-09-23 LAB — BLADDER SCAN AMB NON-IMAGING: Scan Result: 3

## 2022-09-23 NOTE — Progress Notes (Signed)
Name: Miguel Colon DOB: April 20, 1955 MRN: [id number]  History of Present Illness: Miguel Colon is a 67 y.o. male who presents today for return patient visit at [facility] [place]. - GU history: 1. BPH with LUTS (nocturia, weak stream, straining to void). 2. Bladder wall thickening. Per [facility] on 06/17/2022.  At last visit with Dr. Ronne Binning on 08/13/2022: The plan was:  - Start Uroxatral 10 mg nightly. - Follow up for cystoscopy.   Today: He reports symptomatic improvement - no longer having nocturia. He denies increased urinary urgency, frequency, nocturia, dysuria, gross hematuria, hesitancy, straining to void, or sensations of incomplete emptying.   Fall Screening: Do you usually have a device to assist in your mobility? No   Medications: Current Outpatient Medications  Medication Sig Dispense Refill   alfuzosin (UROXATRAL) 10 MG 24 hr tablet Take 1 tablet (10 mg total) by mouth at bedtime. 30 tablet 11   amLODipine (NORVASC) 2.5 MG tablet Take 1 tablet (2.5 mg total) by mouth daily. 30 tablet 1   cyanocobalamin (VITAMIN B12) 1000 MCG tablet Take 1 tablet (1,000 mcg total) by mouth daily. 30 tablet 1   feeding supplement, ENSURE COMPLETE, (ENSURE COMPLETE) LIQD Take 237 mLs by mouth 2 (two) times daily between meals.     levothyroxine (SYNTHROID) 150 MCG tablet Take 1 tablet (150 mcg total) by mouth daily at 6 (six) AM. 30 tablet 1   sodium bicarbonate 650 MG tablet Take 1 tablet (650 mg total) by mouth 2 (two) times daily. 60 tablet 1   No current facility-administered medications for this visit.   Facility-Administered Medications Ordered in Other Visits  Medication Dose Route Frequency Provider Last Rate Last Admin   etoposide (VEPESID) 120 mg in sodium chloride 0.9 % 500 mL chemo infusion  70 mg/m2 (Treatment Plan Recorded) Intravenous Once Doreatha Massed, MD        Allergies: No Known Allergies  Past Medical History:  Diagnosis Date   Asthma    Back pain     Lung cancer (HCC)    Migraines    Thyroid disease    Past Surgical History:  Procedure Laterality Date   IR IMAGING GUIDED PORT INSERTION  09/30/2020   THYROIDECTOMY     Family History  Problem Relation Age of Onset   Hypertension Mother    Hypertension Father    Colon cancer Father 71   Social History   Socioeconomic History   Marital status: Legally Separated    Spouse name: Not on file   Number of children: 3   Years of education: Not on file   Highest education level: Not on file  Occupational History   Occupation: Retired  Tobacco Use   Smoking status: Former    Current packs/day: 0.00    Average packs/day: 0.5 packs/day for 47.0 years (23.5 ttl pk-yrs)    Types: Cigarettes    Start date: 09/02/1973    Quit date: 09/02/2020    Years since quitting: 2.0   Smokeless tobacco: Never  Vaping Use   Vaping status: Never Used  Substance and Sexual Activity   Alcohol use: No   Drug use: No   Sexual activity: Not Currently  Other Topics Concern   Not on file  Social History Narrative   Not on file   Social Determinants of Health   Financial Resource Strain: Medium Risk (09/16/2020)   Overall Financial Resource Strain (CARDIA)    Difficulty of Paying Living Expenses: Somewhat hard  Food Insecurity: No Food  Insecurity (08/07/2022)   Hunger Vital Sign    Worried About Running Out of Food in the Last Year: Never true    Ran Out of Food in the Last Year: Never true  Transportation Needs: No Transportation Needs (08/07/2022)   PRAPARE - Administrator, Civil Service (Medical): No    Lack of Transportation (Non-Medical): No  Physical Activity: Insufficiently Active (09/16/2020)   Exercise Vital Sign    Days of Exercise per Week: 5 days    Minutes of Exercise per Session: 20 min  Stress: No Stress Concern Present (09/16/2020)   [employer] of Occupational Health - Occupational Stress Questionnaire    Feeling of Stress : Not at all  Social Connections:  Socially Isolated (09/16/2020)   Social Connection and Isolation Panel [NHANES]    Frequency of Communication with Friends and Family: Three times a week    Frequency of Social Gatherings with Friends and Family: Three times a week    Attends Religious Services: Never    Active Member of Clubs or Organizations: No    Attends Banker Meetings: Never    Marital Status: Separated  Intimate Partner Violence: Not At Risk (08/07/2022)   Humiliation, Afraid, Rape, and Kick questionnaire    Fear of Current or Ex-Partner: No    Emotionally Abused: No    Physically Abused: No    Sexually Abused: No    Review of Systems Constitutional: Patient denies any unintentional weight loss or change in strength lntegumentary: Patient denies any rashes or pruritus Cardiovascular: Patient denies chest pain or syncope Respiratory: Patient denies shortness of breath Gastrointestinal: Patient denies nausea, vomiting, constipation, or diarrhea Musculoskeletal: Patient denies muscle cramps or weakness Neurologic: Patient denies convulsions or seizures Psychiatric: Patient denies memory problems Allergic/Immunologic: Patient denies recent allergic reaction(s) Hematologic/Lymphatic: Patient denies bleeding tendencies Endocrine: Patient denies heat/cold intolerance  GU: As per HPI.  OBJECTIVE Vitals:   09/23/22 1352  BP: 106/70  Pulse: (!) 45  Temp: 98.7 F (37.1 C)   There is no height or weight on file to calculate BMI.  Physical Examination  Constitutional: No obvious distress; patient is non-toxic appearing  Cardiovascular: No visible lower extremity edema.  Respiratory: The patient does not have audible wheezing/stridor; respirations do not appear labored  Gastrointestinal: Abdomen non-distended Musculoskeletal: Normal ROM of UEs  Skin: No obvious rashes/open sores  Neurologic: CN 2-12 grossly intact Psychiatric: Answered questions appropriately with normal affect   Hematologic/Lymphatic/Immunologic: No obvious bruises or sites of spontaneous bleeding  UA: negative  PVR: 3 ml  ASSESSMENT Benign prostatic hyperplasia with urinary obstruction - Plan: Urinalysis, Routine w reflex microscopic, BLADDER SCAN AMB NON-IMAGING  Bladder wall thickening - Plan: Urinalysis, Routine w reflex microscopic, BLADDER SCAN AMB NON-IMAGING  He is doing well. Agreed to continue Uroxatral and follow up for cystoscopy for further evaluation of bladder wall thickening as previously advised by Dr. Ronne Binning. Pt verbalized understanding and agreement. All questions were answered.  PLAN Advised the following: 1. Continue Uroxatral.  2. Return for 1st available cystoscopy with Dr. Ronne Binning.  Orders Placed This Encounter  Procedures   Urinalysis, Routine w reflex microscopic   BLADDER SCAN AMB NON-IMAGING    It has been explained that the patient is to follow regularly with their PCP in addition to all other providers involved in their care and to follow instructions provided by these respective offices. Patient advised to contact urology clinic if any urologic-pertaining questions, concerns, new symptoms or problems arise in the interim  period.  There are no Patient Instructions on file for this visit.  Electronically signed by:  Donnita Falls, FNP   09/23/22    2:43 PM

## 2022-10-15 ENCOUNTER — Encounter (HOSPITAL_COMMUNITY): Payer: Self-pay | Admitting: Hematology

## 2022-11-04 ENCOUNTER — Encounter (INDEPENDENT_AMBULATORY_CARE_PROVIDER_SITE_OTHER): Payer: Self-pay | Admitting: *Deleted

## 2022-12-08 ENCOUNTER — Other Ambulatory Visit: Payer: 59 | Admitting: Urology

## 2023-01-20 IMAGING — MR MR HEAD WO/W CM
17 of 18 series · 39 of 48 positions shown · IV contrast (gadavist)
Comparison: 09/02/2020

CLINICAL DATA: Small cell lung cancer with weakness

EXAM:
MRI HEAD WITHOUT AND WITH CONTRAST
TECHNIQUE: Multiplanar, multiecho pulse sequences of the brain and surrounding
structures were obtained without and with intravenous contrast.
CONTRAST:  7mL GADAVIST GADOBUTROL 1 MMOL/ML IV SOLN

[Series 5: DWI · axial · 3.0mm · 0.77mm/px · z∈[-48,+96]mm · 3 of 50 slices shown (1 of 6)]
[im 1/50]
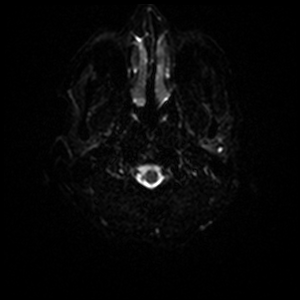
[im 25/50]
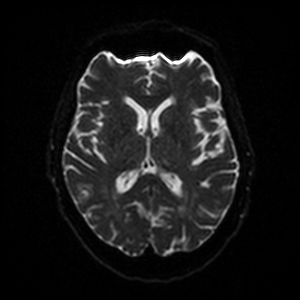
[im 50/50]
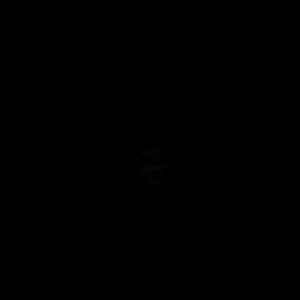

[Series 5: DWI · axial · 3.0mm · 0.77mm/px · z∈[-48,+96]mm · 3 of 50 slices shown (2 of 6)]
[im 1/50]
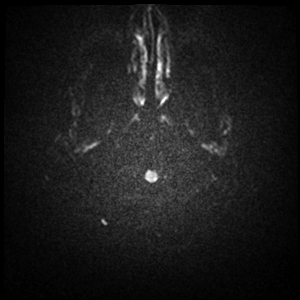
[im 25/50]
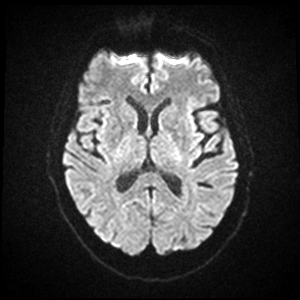
[im 50/50]
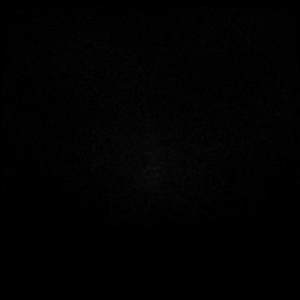

[Series 6: DWI · axial · 3.0mm · 0.77mm/px · z∈[-48,+96]mm · 3 of 50 slices shown (3 of 6)]
[im 1/50]
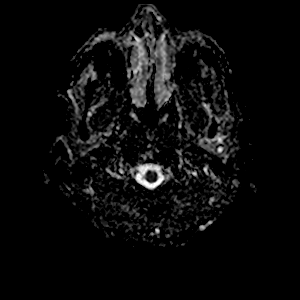
[im 25/50]
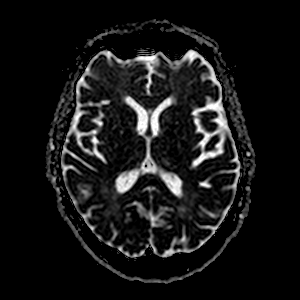
[im 50/50]
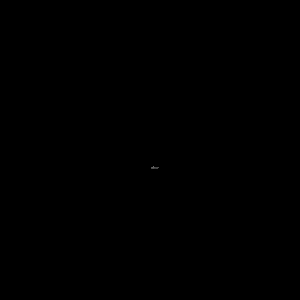

[Series 7: DWI · coronal · 5.0mm · 0.88mm/px · 1 of 28 slices shown (4 of 6)]
[im 1/28]
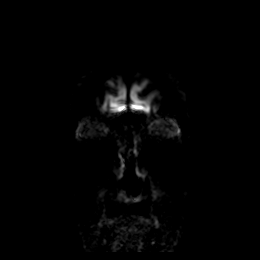

[Series 7: DWI · coronal · 5.0mm · 0.88mm/px · 1 of 28 slices shown (5 of 6)]
[im 1/28]
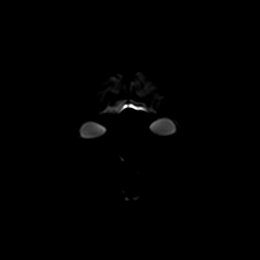

[Series 8: DWI · coronal · 5.0mm · 0.88mm/px · 1 of 28 slices shown (6 of 6)]
[im 1/28]
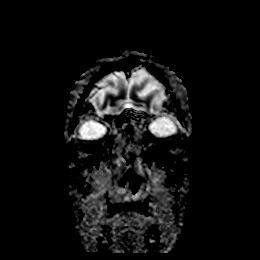

[Series 9: T1 · sagittal · 5.0mm · 0.75mm/px · 1 of 21 slices shown (1 of 2)]
[im 1/21]
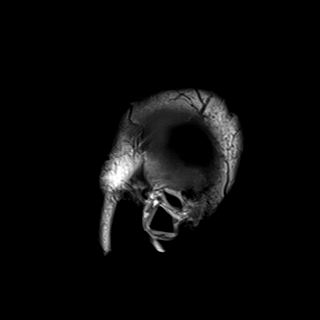

[Series 10: T2 · axial · 5.0mm · 0.72mm/px · 1 of 23 slices shown (1 of 2)]
[im 1/23]
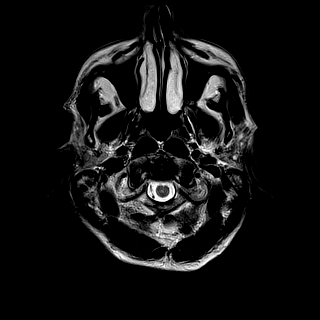

[Series 11: mag_images · axial · 3.0mm · 0.90mm/px · z∈[-62,+111]mm · 3 of 60 slices shown]
[im 1/60]
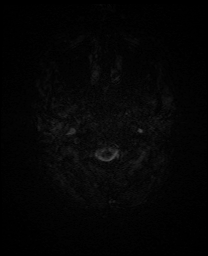
[im 30/60]
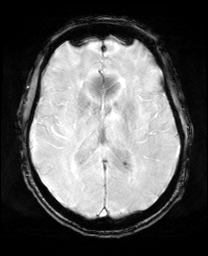
[im 60/60]
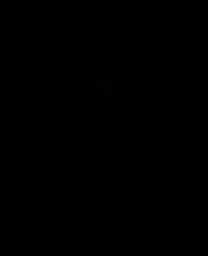

[Series 12: pha_images · axial · 3.0mm · 0.90mm/px · z∈[-62,+96]mm · 3 of 55 slices shown]
[im 1/55]
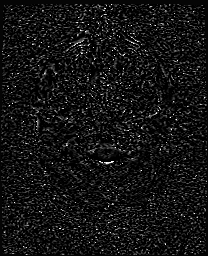
[im 28/55]
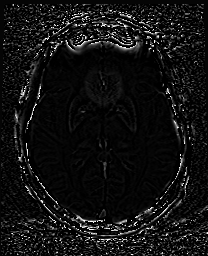
[im 55/55]
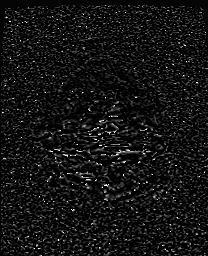

[Series 13: swi_images · axial · 3.0mm · 0.90mm/px · z∈[-62,+111]mm · 3 of 60 slices shown]
[im 1/60]
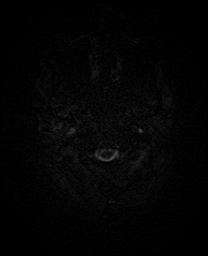
[im 30/60]
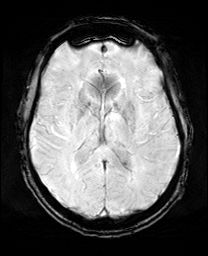
[im 60/60]
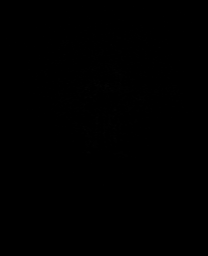

[Series 15: FLAIR · axial · 3.0mm · 0.45mm/px · z∈[-47,+96]mm · 3 of 50 slices shown (1 of 2)]
[im 1/50]
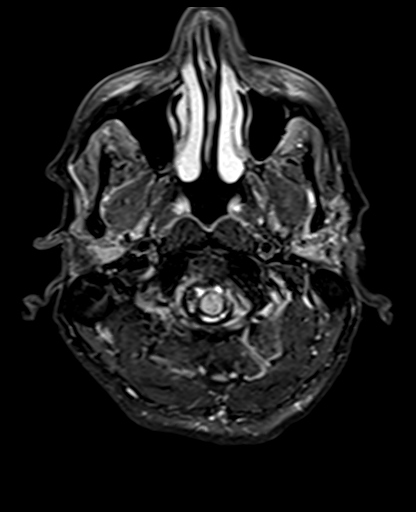
[im 25/50]
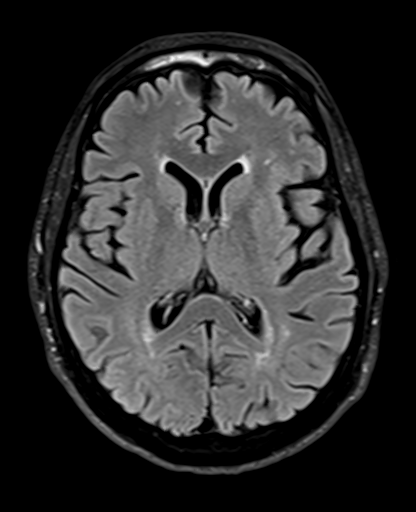
[im 50/50]
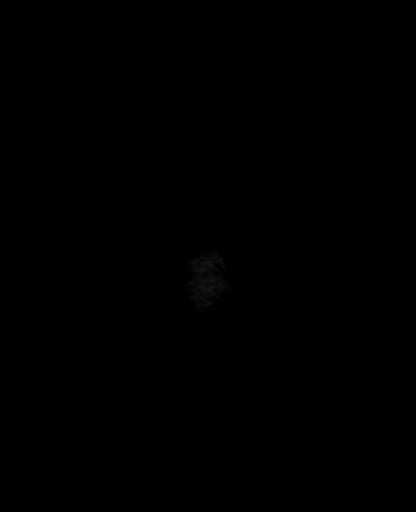

[Series 16: T1 · axial · 1.0mm · 0.98mm/px · z∈[-63,+105]mm · 9 of 171 slices shown (2 of 2)]
[im 1/171]
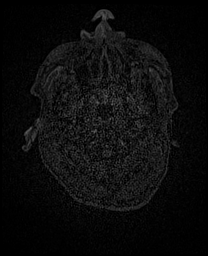
[im 22/171]
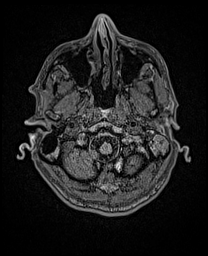
[im 43/171]
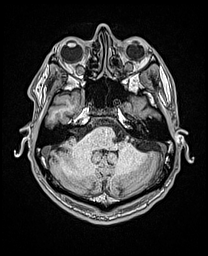
[im 64/171]
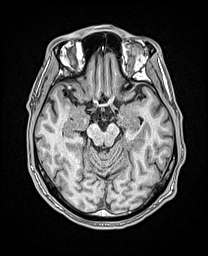
[im 86/171]
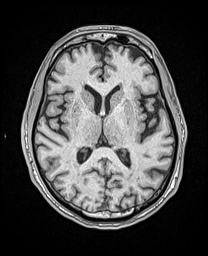
[im 107/171]
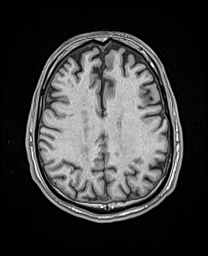
[im 128/171]
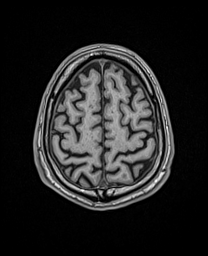
[im 149/171]
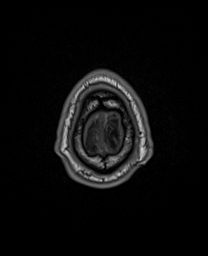
[im 171/171]
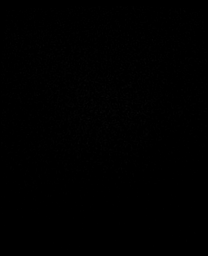

[Series 17: FLAIR · sagittal · 5.0mm · 0.94mm/px · 1 of 21 slices shown (2 of 2)]
[im 1/21]
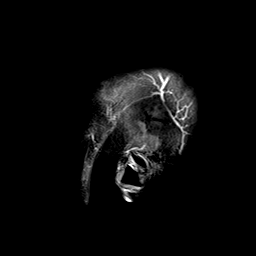

[Series 18: T2 · coronal · 5.0mm · 0.72mm/px · 1 of 28 slices shown (2 of 2)]
[im 1/28]
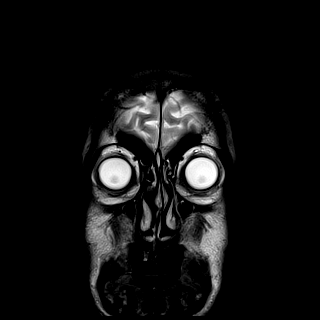

[Series 20: T1 post-contrast · coronal · 5.0mm · 0.34mm/px · 1 of 28 slices shown (1 of 2)]
[im 1/28]
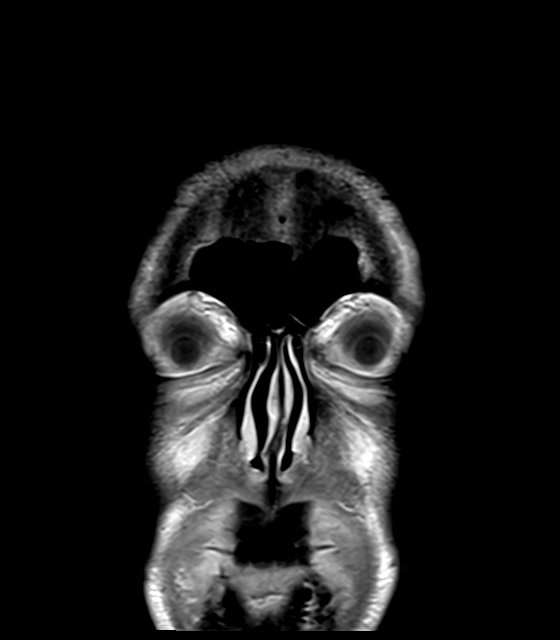

[Series 21: T1 post-contrast · sagittal · 5.0mm · 0.75mm/px · 1 of 21 slices shown (2 of 2)]
[im 1/21]
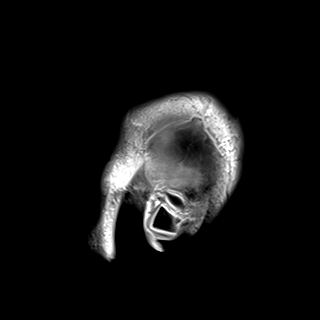

[39 of 48 positions shown; findings below may reference images not displayed]

FINDINGS: Brain: No enhancement or swelling to suggest metastatic disease.
Negative for infarct, hemorrhage, hydrocephalus, or collection. Mild
chronic small vessel ischemia in the hemispheric white matter.

Vascular: Normal flow voids and vascular enhancements

Skull and upper cervical spine: Normal marrow signal. No abnormality
seen at the right pterygoid to correlate with recent PET CT. C2-3
and C3-4 disc degeneration with cord impingement at C3-4.

Sinuses/Orbits: Negative
IMPRESSION: 1. No evidence of metastatic disease to the brain.
2. Normal appearance of the right pterygoid which was highlighted on
recent PET CT.
3. Degenerative cord impingement at C3-4.

## 2023-09-26 ENCOUNTER — Encounter (HOSPITAL_COMMUNITY): Payer: Self-pay | Admitting: Hematology

## 2023-09-26 NOTE — Progress Notes (Signed)
 Erroneous encounter

## 2023-12-28 ENCOUNTER — Other Ambulatory Visit (HOSPITAL_COMMUNITY): Payer: Self-pay | Admitting: Family Medicine

## 2023-12-28 DIAGNOSIS — Z720 Tobacco use: Secondary | ICD-10-CM
